# Patient Record
Sex: Female | Born: 1955 | Race: White | Hispanic: No | Marital: Married | Smoking: Current every day smoker
Health system: Southern US, Community
[De-identification: ages and names within clinical notes are randomized; demographics above are authoritative.]

## PROBLEM LIST (undated history)

## (undated) ENCOUNTER — Emergency Department (HOSPITAL_COMMUNITY): Admission: EM | Payer: Managed Care, Other (non HMO)

## (undated) DIAGNOSIS — G43909 Migraine, unspecified, not intractable, without status migrainosus: Secondary | ICD-10-CM

## (undated) DIAGNOSIS — Z87440 Personal history of urinary (tract) infections: Secondary | ICD-10-CM

## (undated) DIAGNOSIS — F329 Major depressive disorder, single episode, unspecified: Secondary | ICD-10-CM

## (undated) DIAGNOSIS — J449 Chronic obstructive pulmonary disease, unspecified: Secondary | ICD-10-CM

## (undated) DIAGNOSIS — G709 Myoneural disorder, unspecified: Secondary | ICD-10-CM

## (undated) DIAGNOSIS — I10 Essential (primary) hypertension: Secondary | ICD-10-CM

## (undated) DIAGNOSIS — M1990 Unspecified osteoarthritis, unspecified site: Secondary | ICD-10-CM

## (undated) DIAGNOSIS — F32A Depression, unspecified: Secondary | ICD-10-CM

## (undated) DIAGNOSIS — F419 Anxiety disorder, unspecified: Secondary | ICD-10-CM

## (undated) DIAGNOSIS — T1491XA Suicide attempt, initial encounter: Secondary | ICD-10-CM

## (undated) HISTORY — PX: BACK SURGERY: SHX140

## (undated) HISTORY — PX: FRACTURE SURGERY: SHX138

---

## 2001-02-25 ENCOUNTER — Emergency Department (HOSPITAL_COMMUNITY): Admission: EM | Admit: 2001-02-25 | Discharge: 2001-02-25 | Payer: Self-pay | Admitting: Emergency Medicine

## 2002-05-01 ENCOUNTER — Encounter: Payer: Self-pay | Admitting: Emergency Medicine

## 2002-05-01 ENCOUNTER — Inpatient Hospital Stay (HOSPITAL_COMMUNITY): Admission: EM | Admit: 2002-05-01 | Discharge: 2002-05-04 | Payer: Self-pay | Admitting: Emergency Medicine

## 2002-05-04 ENCOUNTER — Inpatient Hospital Stay (HOSPITAL_COMMUNITY): Admission: EM | Admit: 2002-05-04 | Discharge: 2002-05-07 | Payer: Self-pay | Admitting: Psychiatry

## 2002-05-08 ENCOUNTER — Other Ambulatory Visit (HOSPITAL_COMMUNITY): Admission: RE | Admit: 2002-05-08 | Discharge: 2002-05-12 | Payer: Self-pay | Admitting: Psychiatry

## 2006-09-30 ENCOUNTER — Emergency Department (HOSPITAL_COMMUNITY): Admission: EM | Admit: 2006-09-30 | Discharge: 2006-09-30 | Payer: Self-pay | Admitting: Emergency Medicine

## 2010-01-02 ENCOUNTER — Ambulatory Visit (HOSPITAL_BASED_OUTPATIENT_CLINIC_OR_DEPARTMENT_OTHER): Admission: RE | Admit: 2010-01-02 | Discharge: 2010-01-02 | Payer: Self-pay | Admitting: Orthopedic Surgery

## 2010-01-03 HISTORY — PX: ORIF CLAVICLE FRACTURE: SUR924

## 2010-09-10 LAB — POCT I-STAT 4, (NA,K, GLUC, HGB,HCT)
Potassium: 3.9 mEq/L (ref 3.5–5.1)
Sodium: 138 mEq/L (ref 135–145)

## 2010-10-05 ENCOUNTER — Ambulatory Visit (HOSPITAL_COMMUNITY)
Admission: RE | Admit: 2010-10-05 | Discharge: 2010-10-05 | Disposition: A | Discharge status: Home / Self Care | Payer: Commercial Managed Care - PPO | Source: Ambulatory Visit | Attending: Orthopedic Surgery | Admitting: Orthopedic Surgery

## 2010-10-05 ENCOUNTER — Other Ambulatory Visit (HOSPITAL_COMMUNITY): Payer: Self-pay | Admitting: Orthopedic Surgery

## 2010-10-05 DIAGNOSIS — T148XXA Other injury of unspecified body region, initial encounter: Secondary | ICD-10-CM

## 2010-10-05 DIAGNOSIS — K5730 Diverticulosis of large intestine without perforation or abscess without bleeding: Secondary | ICD-10-CM | POA: Insufficient documentation

## 2010-10-05 DIAGNOSIS — M169 Osteoarthritis of hip, unspecified: Secondary | ICD-10-CM | POA: Insufficient documentation

## 2010-10-05 DIAGNOSIS — M25559 Pain in unspecified hip: Secondary | ICD-10-CM | POA: Insufficient documentation

## 2010-10-05 DIAGNOSIS — M1610 Unilateral primary osteoarthritis, unspecified hip: Secondary | ICD-10-CM | POA: Insufficient documentation

## 2010-11-10 NOTE — H&P (Signed)
Brenda Rice, Brenda Rice                           ACCOUNT NO.:  [id number]   MEDICAL RECORD NO.:  [id number]                   PATIENT TYPE:  IPS   LOCATION:  0302                                 FACILITY:  [facility]   PHYSICIAN:  Geoffery Lyons, M.D.                   DATE OF BIRTH:  1956-05-03   DATE OF ADMISSION:  05/04/2002  DATE OF DISCHARGE:  05/07/2002                         PSYCHIATRIC ADMISSION ASSESSMENT   IDENTIFYING INFORMATION:  This is a 55 year old white female who is a  voluntary admission.   HISTORY OF PRESENT ILLNESS:  This patient with a history of alcohol abuse  since age 20 was admitted through the emergency room for an overdose of an  unknown number of Seroquel, along with an unknown number of Xanax, and she  had apparently been drinking alcohol.  She reports drinking approximately 6  beers per day.  She endorses depressed mood, irritable, and finds herself  being very self critical and states that she has been unable to sleep well  for the past year.  She stopped Zoloft and Seroquel about a year ago and  states she has not been able to sleep right since then and that her  depression has been becoming much more severe.  It is unclear on why she  stopped the Zoloft.  Her mother died in 11-Aug-2001 and there has been  chronic family conflict over her estate and the patient reports that she  tried to stop drinking for the past 8 days but could not sleep at all.  She  has had no clear period of sobriety within recent memory.  Today, the  patient endorses suicidal ideation without a clear plan.  She has had a 20  pound weight loss in the past month.   PAST PSYCHIATRIC HISTORY:  The patient was previously followed by Dr.  Barnett Abu but has not been there within the past year.  This is her  first admission to [facility].  She does have a  history of one prior hospitalization for depression in the distant past.   SOCIAL HISTORY:  The patient  is a married female with supportive husband and  family.  She works as a Manufacturing systems engineer.  She grew up the eldest  of 4 children.  She denies any legal charges.   FAMILY HISTORY:  Unremarkable.   ALCOHOL AND DRUG HISTORY:  The patient does remark that she uses  benzodiazapines and abuses alcohol.  She denies ever having any use of IV  drugs, denies any abuse of cocaine.   PAST MEDICAL HISTORY:  The patient has no regular primary care Ryon Layton.  She denies any current medical problems.  She does admit that her preventive  care has lapsed for the past several years.  Past medical history is  remarkable for a history of 1 miscarriage.  She has no history  of prior  hospitalizations or surgeries other than the hospitalization once prior for  depression.   REVIEW OF SYSTEMS:  Remarkable for no history of seizures, blackouts.  She  does endorse irritability and poor appetite and poor sleep.   MEDICATIONS:  The patient reports that she occasionally uses leftover Xanax  when she attempts to stop drinking.  This is a Xanax prescription from some  time ago in the distant past which she has had around the house.   DRUG ALLERGIES:  None.   POSITIVE PHYSICAL FINDINGS:  Please see the physical examination that is  noted in the record and was done on the medicine service prior to transfer.  Today, she is a well-nourished, well-developed female who is in no acute  distress.  Vital signs on admission to the unit show an elevated blood  pressure of 183/117.  Additional labs are currently pending.  Her metabolic  panel this morning noted platelets decreased at 147,000, and her potassium  on her metabolic panel was decreased at 3.1 on a scale of 3.5 - 5.5 normal.   MENTAL STATUS EXAM:  This is a slim-built female who is fully alert, with a  tearful affect.  She is cooperative, calm with good focus.  She is mildly  tremulous.  Speech is normal.  She is fairly articulate.  Mood is  depressed,  feeling guilty about her overdose and her substance abuse.  She feels quite  hopeless to overcome her family stressors and the conflict with her family  over her mother's estate.  Thought process is logical, no psychosis, no  evidence of paranoia.  She is suicidal with a plan to overdose, no evidence  of homicidal ideation.  Cognitively she is intact and oriented x3.   ADMISSION DIAGNOSIS:   AXIS I:  1. Major depressive disorder, recurrent, severe.  2. Alcohol abuse.   AXIS II:  Deferred.   AXIS III:  Hypokalemia and elevated blood pressure.   AXIS IV:  Moderate, family conflict.   AXIS V:  Current 20, past year 44.   INITIAL PLAN OF CARE:  Voluntarily admit the patient to detox her and we  will treat her symptoms.  Our goal is a safe detox and to alleviate her  suicidal ideation.  We have elected to place her on a Librium detox protocol  and will start her also on Zoloft 25 mg p.o. daily.  At this point, because  her blood pressure is elevated we will give her a dose of Clonidine to cover  this and after we have started a Librium detox protocol, we will see how her  blood pressure adjusts and if that comes down to normal.  She does appear to  be in full withdrawal at this time.  We have discussed this with the patient  and she is in full agreement with this plan.  Additionally, we are going to  give her K-Dur 20 mEq b.i.d. x 3 to correct her hypokalemia.      Margaret A. Scott, N.P.                   Geoffery Lyons, M.D.    MAS/MEDQ  D:  06/04/2002  T:  06/05/2002  Job:  [id number]

## 2010-11-10 NOTE — Discharge Summary (Signed)
Brenda Rice, Brenda Rice                           ACCOUNT NO.:  [id number]   MEDICAL RECORD NO.:  [id number]                   PATIENT TYPE:  IPS   LOCATION:  0302                                 FACILITY:  [facility]   PHYSICIAN:  Geoffery Lyons, M.D.                   DATE OF BIRTH:  08/06/1955   DATE OF ADMISSION:  05/04/2002  DATE OF DISCHARGE:  05/07/2002                                 DISCHARGE SUMMARY   CHIEF COMPLAINT AND PRESENT ILLNESS:  This was the first admission to [facility] for this 55 year old white female voluntarily  admitted.  She claimed I have just given up.  History of alcohol  dependence since age 55.  Admitted to the emergency room after overdosing on  Seroquel, alcohol and Xanax.  Drinking at least six beers daily.  Endorsed  depressed mood, irritable, self-critical, unable to sleep.  Has been on  Zoloft and Seroquel for a year.  Her mother died in 2001/08/22.  Her  family has conflict over the estate.  Endorsed suicidal ideation.  Tried to  stop drinking eight days with no sleep.   PAST PSYCHIATRIC HISTORY:  Previously followed by Dr. Milagros Evener.  One  prior admission for depression.   ALCOHOL/DRUG HISTORY:  Use of alcohol and benzodiazepines.   PAST MEDICAL HISTORY:  Denies the history of any major medical condition.   MEDICATIONS:  Xanax (when attempting to stop drinking).   PHYSICAL EXAMINATION:  Performed and failed to show any acute findings.   MENTAL STATUS EXAM:  Slim-built, fully alert, tearful female.  Cooperative,  calm.  Good focus.  Speech normal, goal directed.  She looks tremulous.  Mood depressed.  Affect depressed.  Thought processes logical, coherent and  relevant.  Content dealt with the events that led to be admitted.  No  evidence of psychosis.  No paranoia.  Wanting to die.  No act or plan.  No  homicidal ideation.  Cognition well-preserved.   ADMISSION DIAGNOSES:   AXIS I:  1. Major depression, recurrent.  2. Alcohol dependence.  3. Benzodiazepine abuse.   AXIS II:  No diagnosis.   AXIS III:  1. Hypokalemia.  2. High blood pressure.   AXIS IV:  Moderate.   AXIS V:  Global Assessment of Functioning upon admission 25; highest Global  Assessment of Functioning in the last 60-65.   LABORATORY DATA:  CBC was within normal limits.  Blood chemistries were  within normal limits.  Sodium 131, potassium 3.1.  SGOT and SGPT were within  normal limits.   HOSPITAL COURSE:  She was admitted and started intensive individual and  group psychotherapy.  She was detoxified using Librium and she was placed on  Zoloft 25 mg per day and Zoloft was increased to 50 mg per day.  She  continued to evidence increased blood pressure.  She required some clonidine  on a p.r.n. basis.  As the hospitalization progressed, she was able to  endorse the fact that she was dependent more and more on the alcohol and she  had indeed abused the benzodiazepines.  She did endorse recurrent  depression, even when she was not under the influence of alcohol.  On  May 07, 2002, she was fully detoxed, in full contact with reality.  Mood was improved.  Affect was bright and broad.  She denied any suicidal or  homicidal ideation.  We had a family session with the husband that went  well.  She was committed to sobriety, was willing to follow up with the  medications and abstain from alcohol and benzodiazepines.  As she was fully  detoxed and stable enough to be handled on an outpatient basis, we  discharged to outpatient follow-up.   DISCHARGE DIAGNOSES:   AXIS I:  1. Major depression, recurrent.  2. Alcohol dependence.  3. Benzodiazepine abuse.   AXIS II:  No diagnosis.   AXIS III:  1. Hypokalemia.  2. Hypertension.   AXIS IV:  Moderate.   AXIS V:  Global Assessment of Functioning upon discharge 55-60.   DISCHARGE MEDICATIONS:  1. Zoloft 50 mg per day.  2. Clonidine 0.1 mg twice a day.  3. Potassium 20 mEq  twice a day.   FOLLOW UP:  Continue follow-up with Valente David, CD IOP and her primary  physician.                                               Geoffery Lyons, M.D.    IL/MEDQ  D:  06/03/2002  T:  06/04/2002  Job:  [id number]

## 2010-11-10 NOTE — Discharge Summary (Signed)
Brenda Rice, Brenda Rice                           ACCOUNT NO.:  [id number]   MEDICAL RECORD NO.:  [id number]                   PATIENT TYPE:  INP   LOCATION:  3308                                 FACILITY:  [facility]   PHYSICIAN:  Catalina Pizza, M.D.                     DATE OF BIRTH:  [birth year]   DATE OF ADMISSION:  05/01/2002  DATE OF DISCHARGE:  05/04/2002                                 DISCHARGE SUMMARY   DISCHARGE DIAGNOSES:  1. Overdose on Seroquel.  2. Suicide attempt.  3. Alcohol abuse with withdrawal symptoms.  4. Respiratory depression.  5. Depression.  6. Hypertension.   DISCHARGE MEDICATIONS:  1. Lorazepam 1 mg q.4h. for ETOH withdrawal symptoms.  2. Protonix 40 mg p.o. q.d.  3. Thiamine 100 mg p.o. q.d.  4. Folic acid 1 mg p.o. q.d.   HISTORY OF PRESENT ILLNESS:  Brenda Rice is a 55 year old female with history  of depression and multiple suicide attempts, last being 2002, one in 1999,  and also in the mid 90s, ETOH and tobacco abuse who presented to the  emergency department after husband found her unresponsive on the cough. She  was approximately at 6 p.m. on 05/01/02. A note was found after the fact  which stated some suggestions for her brothers and sisters working out their  differences. At the time that the husband found Brenda Rice, he found  approximately 11 empty beer cans as well as an empty bottle Seroquel which  he estimated to have about 10 to 12 pills in it. He also stated that he felt  that she had taken some of his Xanax as well. He did report that she had  consistent breathing as well as good pulse and tried to wake her up with  taking her to the shower as well as forced emesis. Apparently, he has done  this before in similar situations. Her husband states that she had been  sneaking drinks daily but has not had any recent changes in behavioral  pattern. The patient was not seeing a psychiatrist currently and not taking  any antidepressants. She has previously  been on Zoloft and Seroquel in the  past and was previously followed by Dr. Lafayette Dragon, the psychiatrist, for her  problems.   ALLERGIES:  No known drug allergies.   MEDICATIONS:  On no current medicines prior to admission.   SUBSTANCE ABUSE HISTORY:  She is a current smoker, approximately two packs  per day for multiple years. Alcohol greater than a six pack per day. No  cocaine use. No IV drug use.   SOCIAL HISTORY:  She is married, and she is an Geophysicist/field seismologist at a Teacher, [employer]. She lives with her husband, and her son and grandchildren  live nearby. Currently in dispute with brothers and sisters about the state  of her deceased mother which is contributing to her  feelings of depression.   PHYSICAL EXAMINATION:  GENERAL:  Initially on exam, she was unresponsive but  on vent.  VITAL SIGNS:  Stable.  HEENT:  She had sluggish pupils, 1 to 2 mm.  LUNGS:  Respirations were clear. No wheezing or crackles.  CARDIOVASCULAR:  She is tachycardic but regular rhythm. No murmurs, rubs, or  gallops.  GI:  Positive bowel sounds. Soft. Nontender. Nondistended. No  hepatosplenomegaly.  EXTREMITIES:  No edema or cyanosis. Two plus pulses in all extremities.  NEUROLOGICAL:  Toes downgoing bilaterally. Withdraws from pain. Positive  corneal reflex.   LABORATORY DATA:  Initial lab work obtained showed ABG on FiO2 of 100% of  7.36, 35.2, and 491.0, O2. Her chest x-ray just showed cardiomegaly, no  acute disease. Sodium 135, potassium 3.3, chloride 106, bicarb 19, BUN 6,  creatinine 0.8, glucose 102. WBCs 10.3, hemoglobin 13.0, platelets 128, ANC  8.7, MCV 95.8. Anion gap was 10, bilirubin of 0.7, alkaline phosphatase 56,  SGOT 24, SGPT 16, protein 6.4, albumin 3.4, calcium 8.2. Acetaminophen less  than 10. Salicylates less than 4. Alcohol 181. UDS was negative. She was  positive for tricyclics.   HOSPITAL COURSE:  1. Seroquel overdose. The patient ingested an unknown amount of Seroquel but      was monitored consistently for common side effects which were CNS     depression, tachycardia, hypertension, prolongation of QTC and QRS. The     patient did receive serial EKGs which showed no progression of QRS or     QTC. She was on ventilator support as well as fluid support with no signs     of hypotension. The patient was weaned off of ventilation and extubated     approximately 12 hours after presentation to the emergency department.     Concerns were to continue to monitor due to Seroquel overdose may have     half life up to 20 hours. It also caused UDS to be positive for     tricyclics which is consistent with her taking the Seroquel. She did not     have an anion gap so doubt methanol, ethylene glycol, or isopropyl     alcohol. The patient's vital signs are stable at this time with some     tachycardia and mild hypertension, probably secondary to alcohol withdraw     type symptoms.  2. Alcohol abuse and withdraw. Per her husband, she was drinking     approximately greater than 6 beers a day and 11 beers prior to this     presentation to the emergency department. At this time, she is having     some tachycardia with slight elevation in blood pressure, probably     secondary to ETOH withdraw. She is currently on Ativan p.r.n. with     withdraw precautions in effect. Some of her tachycardia and anxiety may     be due to the fact that she also has two pack per day smoking history and     feels some anxiety about that as well.  3. Depression/suicidality. The patient has been evaluated by psychiatry who     stays for further evaluation at the behavioral health center. In talking     with patient, she states that she was not trying to harm herself.     Apparently, she had written it was not a suicide note, just writing down     suggestions and plans for sisters and brothers over splitting her  mother's estate. 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 Psych to further evaluate     this. Has had multiple suicide attempts previously and has multiple     family stresses. The patient denies any report of abuse at home and no     absences from work in the recent past. The patient is very evasive with     certain questions and has multiple nonconsistent type history.  4. Anemia. No signs of bleed. Probable decreased due to malnutrition and     ETOH use. Will need to be worked up as an outpatient on an outpatient     basis. No further workup needed at this time.  5. Decreased platelets. May be decreased due to ETOH. Stable at this time.     Could have been a delusional affect.    DISPOSITION:  The patient is medically stable at this point and currently  evaluated by psychiatry for further evaluation. ACT team to further assess  for transfer to [facility]. Encourage to seek further psychiatric  help. Have Behavioral Health continue monitoring for withdraw symptoms from  ETOH. Does have a slight tachycardia and blood pressure increase. Continue  with Ativan as needed to control this.                                               Catalina Pizza, M.D.    ZH/MEDQ  D:  05/04/2002  T:  05/04/2002  Job:  [id number]   cc:   Behavioral Health

## 2010-11-10 NOTE — H&P (Signed)
[facility]  Patient:    Brenda Rice, Brenda Rice Visit Number: [id number] MRN: [id number]          Service Type: EMS Location: ED Attending Physician:  Ilene Qua Dictated by:   Marinda Elk, M.D. Admit Date:  02/25/2001 Discharge Date: 02/25/2001                           History and Physical  HISTORY OF PRESENT ILLNESS:  This 55 year old married white female was brought to [facility] Emergency Room obtunded and intoxicated.  Dr. Christeen Douglas called me to admit the patient for multiple drug overdose.  Patient had been upset over losing her job last Friday, and today apparently got denied disability.  She became further upset and "just wanted to forget about it."  She denies any suicidal ideation.  She took three Valium, three Xanax and drank three 40-ounce bottles of beer.  She called her son on his cell phone, and the son reported to husband that he should go home and check things out.  When the husband got there, he found his wife on the floor with an upset ashtray nearby.  She was responsive but very confused and was unable to stand and walk on her own.  He therefore called 911 to bring her to the hospital.  Apparently, patient refused NG tube and refused to drink charcoal.  She was otherwise evaluated by Dr. Christeen Douglas.  She became disruptive at one point, pulling out her IV and therefore had to be restrained.  By the time of my arrival at 9:15, patient had calmed down significantly, was still inebriated but was cooperative and coherent.  She was able to relate the amounts and types of substances taken.  Husband was able to verify that none of his medications had been used, and that patient had not used any of the other medicines she had in her possession, at least by inspection of the amounts in bottles.  I did a pill count of the Xanax, where 58 were left of a prescription for 90 from August 23rd.  This would be perfectly in step with taking 3 a day over  the last 10 days, with an extra 3 tonight.  There were no Valium left of a bottle of 21 prescribed August 16th.  A bottle of Seroquel was brought in which she was no longer taking, as she has been switched onto Zoloft in the last several days.  PAST MEDICAL HISTORY:  Patient had one prior overdose attempt, about four years ago.  She was admitted to [facility] at that time, and underwent a three-day detox program with followup by Dr. Eliezer Bottom.  She has since been seen by Dr. Evelene Croon who has prescribed the above medicines.  CURRENT MEDICATIONS: 1. Zoloft 100 mg q.d. 2. Xanax 1 mg t.i.d. 3. Valium 10 mg p.r.n.  ALLERGIES:  None to medications.  SOCIAL HISTORY:  Recently let go from work.  Smokes cigarettes.  Normally drinks alcohol-free beverages, though has had excess alcohol use in the past.  REVIEW OF SYSTEMS:  Patient does not have headache, chest pain, abdominal pain or history of incontinence, though she was incontinent of urine twice in the ER prior to my arrival.  She has had some depression and clearly has anxiety problems at present.  PHYSICAL EXAMINATION:  VITAL SIGNS:  BP 131/78, respirations 16, temperature 97.0.  HEENT:  The eyes show significant lateral gaze nystagmus.  The pupils  are midposition and sluggish to react.  The oral mucosa is dry but free of lesions.  Gag reflex is intact.  NECK:  Supple.  There is no thyromegaly or JVD.  CHEST:  Clear anteriorly and at right base, with slight rhonchi heard on first breath, left base, cleared with deep breathing.  ABDOMEN:  Soft.  No HSM or tenderness.  GENITALIA:  Deferred.  EXTREMITIES:  The extremities allow fair range of motion, with some limit on exam due to four-point restraints.  Pulses in feet are normal.  SKIN:  Clear.  NEUROLOGIC:  By time of discharge, patient was oriented x 3, cooperative and coherent.  She did have some retained sluggish speech and some imbalance with walking, though she was  able to walk without assistance.  LABORATORY DATA:  Alcohol level 163.  Salicylates and acetaminophen undetectable.  BMET showed sodium of 131, BUN down at 5, calcium down at 8.1, glucose 87, remainder normal.  CBC normal.  ASSESSMENT: 1. Multiple substance abuse, with current overdose not triggered by any    suicidal ideation. 2. Situational adjustment reaction to losing job, which was the trigger for    this event. 3. Tobacco abuse. 4. Depression/anxiety, per Dr. Evelene Croon.  PLAN:  Discussed entire situation with patients nurse, nursing supervisor, husband and representative from [employer].  I have chosen to send patient home to the custody of her husband, to avoid any further sedating substances over the next 24 hours, and to call Dr. Evelene Croon tomorrow for followup. Husband has taken on responsibility of making that call, as well as staying with his wife all night tonight and all day tomorrow. Dictated by:   Marinda Elk, M.D. Attending Physician:  Ilene Qua DD:  02/26/01 TD:  02/26/01 Job: [id number]

## 2011-03-31 ENCOUNTER — Emergency Department (HOSPITAL_COMMUNITY): Payer: Managed Care, Other (non HMO)

## 2011-03-31 ENCOUNTER — Inpatient Hospital Stay (HOSPITAL_COMMUNITY)
Admission: EM | Admit: 2011-03-31 | Discharge: 2011-04-03 | DRG: 917 | Disposition: A | Discharge status: Other Acute Inpatient Hospital | Payer: Managed Care, Other (non HMO) | Attending: Pulmonary Disease | Admitting: Pulmonary Disease

## 2011-03-31 DIAGNOSIS — T510X4A Toxic effect of ethanol, undetermined, initial encounter: Secondary | ICD-10-CM | POA: Diagnosis present

## 2011-03-31 DIAGNOSIS — T43591A Poisoning by other antipsychotics and neuroleptics, accidental (unintentional), initial encounter: Secondary | ICD-10-CM | POA: Diagnosis present

## 2011-03-31 DIAGNOSIS — F1010 Alcohol abuse, uncomplicated: Secondary | ICD-10-CM | POA: Diagnosis present

## 2011-03-31 DIAGNOSIS — T65891A Toxic effect of other specified substances, accidental (unintentional), initial encounter: Secondary | ICD-10-CM | POA: Diagnosis present

## 2011-03-31 DIAGNOSIS — N390 Urinary tract infection, site not specified: Secondary | ICD-10-CM | POA: Diagnosis present

## 2011-03-31 DIAGNOSIS — E876 Hypokalemia: Secondary | ICD-10-CM | POA: Diagnosis present

## 2011-03-31 DIAGNOSIS — T424X4A Poisoning by benzodiazepines, undetermined, initial encounter: Principal | ICD-10-CM | POA: Diagnosis present

## 2011-03-31 DIAGNOSIS — F17200 Nicotine dependence, unspecified, uncomplicated: Secondary | ICD-10-CM | POA: Diagnosis present

## 2011-03-31 DIAGNOSIS — F411 Generalized anxiety disorder: Secondary | ICD-10-CM | POA: Diagnosis present

## 2011-03-31 DIAGNOSIS — R4182 Altered mental status, unspecified: Secondary | ICD-10-CM | POA: Diagnosis present

## 2011-03-31 DIAGNOSIS — G9349 Other encephalopathy: Secondary | ICD-10-CM | POA: Diagnosis present

## 2011-03-31 LAB — BLOOD GAS, ARTERIAL
Acid-base deficit: 2.3 mmol/L — ABNORMAL HIGH (ref 0.0–2.0)
Bicarbonate: 22 mEq/L (ref 20.0–24.0)
Drawn by: 336861
FIO2: 0.21 %
O2 Saturation: 95.1 %
Patient temperature: 98.6
TCO2: 19.7 mmol/L (ref 0–100)
pCO2 arterial: 38 mmHg (ref 35.0–45.0)
pH, Arterial: 7.38 (ref 7.350–7.400)
pO2, Arterial: 79.7 mmHg — ABNORMAL LOW (ref 80.0–100.0)

## 2011-03-31 LAB — DIFFERENTIAL
Basophils Absolute: 0 10*3/uL (ref 0.0–0.1)
Basophils Relative: 0 % (ref 0–1)
Eosinophils Absolute: 0.1 10*3/uL (ref 0.0–0.7)
Eosinophils Relative: 2 % (ref 0–5)
Lymphocytes Relative: 54 % — ABNORMAL HIGH (ref 12–46)
Lymphs Abs: 3.6 10*3/uL (ref 0.7–4.0)
Monocytes Absolute: 0.5 10*3/uL (ref 0.1–1.0)
Monocytes Relative: 7 % (ref 3–12)
Neutro Abs: 2.5 10*3/uL (ref 1.7–7.7)
Neutrophils Relative %: 37 % — ABNORMAL LOW (ref 43–77)

## 2011-03-31 LAB — URINE MICROSCOPIC-ADD ON

## 2011-03-31 LAB — CBC
HCT: 42.6 % (ref 36.0–46.0)
Hemoglobin: 14.5 g/dL (ref 12.0–15.0)
MCH: 32.7 pg (ref 26.0–34.0)
MCHC: 34 g/dL (ref 30.0–36.0)
MCV: 96.2 fL (ref 78.0–100.0)
Platelets: 179 10*3/uL (ref 150–400)
RBC: 4.43 MIL/uL (ref 3.87–5.11)
RDW: 12.9 % (ref 11.5–15.5)
WBC: 6.7 10*3/uL (ref 4.0–10.5)

## 2011-03-31 LAB — RAPID URINE DRUG SCREEN, HOSP PERFORMED
Amphetamines: NOT DETECTED
Barbiturates: NOT DETECTED
Benzodiazepines: POSITIVE — AB
Cocaine: NOT DETECTED
Opiates: NOT DETECTED
Tetrahydrocannabinol: NOT DETECTED

## 2011-03-31 LAB — URINALYSIS, ROUTINE W REFLEX MICROSCOPIC
Bilirubin Urine: NEGATIVE
Glucose, UA: NEGATIVE mg/dL
Hgb urine dipstick: NEGATIVE
Ketones, ur: NEGATIVE mg/dL
Leukocytes, UA: NEGATIVE
Nitrite: POSITIVE — AB
Protein, ur: NEGATIVE mg/dL
Specific Gravity, Urine: 1.007 (ref 1.005–1.030)
Urobilinogen, UA: 0.2 mg/dL (ref 0.0–1.0)
pH: 5.5 (ref 5.0–8.0)

## 2011-03-31 LAB — COMPREHENSIVE METABOLIC PANEL
ALT: 16 U/L (ref 0–35)
AST: 21 U/L (ref 0–37)
Albumin: 3.5 g/dL (ref 3.5–5.2)
Alkaline Phosphatase: 77 U/L (ref 39–117)
BUN: 8 mg/dL (ref 6–23)
CO2: 25 mEq/L (ref 19–32)
Calcium: 8.8 mg/dL (ref 8.4–10.5)
Chloride: 98 mEq/L (ref 96–112)
Creatinine, Ser: 0.71 mg/dL (ref 0.50–1.10)
GFR calc Af Amer: >90 mL/min (ref >90)
GFR calc non Af Amer: >90 mL/min (ref >90)
Glucose, Bld: 90 mg/dL (ref 70–99)
Potassium: 3.6 mEq/L (ref 3.5–5.1)
Sodium: 133 mEq/L — ABNORMAL LOW (ref 135–145)
Total Bilirubin: 0.1 mg/dL — ABNORMAL LOW (ref 0.3–1.2)
Total Protein: 7.1 g/dL (ref 6.0–8.3)

## 2011-03-31 LAB — ETHANOL: Alcohol, Ethyl (B): 258 mg/dL — ABNORMAL HIGH (ref 0–11)

## 2011-03-31 LAB — SALICYLATE LEVEL: Salicylate Lvl: <2 mg/dL — ABNORMAL LOW (ref 2.8–20.0)

## 2011-03-31 LAB — ACETAMINOPHEN LEVEL: Acetaminophen (Tylenol), Serum: <15 ug/mL (ref 10–30)

## 2011-03-31 LAB — POCT PREGNANCY, URINE: Preg Test, Ur: NEGATIVE

## 2011-04-01 ENCOUNTER — Inpatient Hospital Stay (HOSPITAL_COMMUNITY): Payer: Managed Care, Other (non HMO)

## 2011-04-01 DIAGNOSIS — R4182 Altered mental status, unspecified: Secondary | ICD-10-CM

## 2011-04-01 DIAGNOSIS — F1010 Alcohol abuse, uncomplicated: Secondary | ICD-10-CM

## 2011-04-01 DIAGNOSIS — T50902A Poisoning by unspecified drugs, medicaments and biological substances, intentional self-harm, initial encounter: Secondary | ICD-10-CM

## 2011-04-01 DIAGNOSIS — T50901A Poisoning by unspecified drugs, medicaments and biological substances, accidental (unintentional), initial encounter: Secondary | ICD-10-CM

## 2011-04-01 LAB — BASIC METABOLIC PANEL
BUN: 7 mg/dL (ref 6–23)
Calcium: 8.4 mg/dL (ref 8.4–10.5)
Chloride: 106 mEq/L (ref 96–112)
Creatinine, Ser: 0.55 mg/dL (ref 0.50–1.10)
GFR calc Af Amer: >90 mL/min (ref >90)

## 2011-04-01 LAB — CBC
HCT: 36 % (ref 36.0–46.0)
MCH: 32.9 pg (ref 26.0–34.0)
MCV: 95.5 fL (ref 78.0–100.0)
Platelets: 183 10*3/uL (ref 150–400)
RDW: 13.1 % (ref 11.5–15.5)
WBC: 7.9 10*3/uL (ref 4.0–10.5)

## 2011-04-02 DIAGNOSIS — R4182 Altered mental status, unspecified: Secondary | ICD-10-CM

## 2011-04-02 DIAGNOSIS — J9600 Acute respiratory failure, unspecified whether with hypoxia or hypercapnia: Secondary | ICD-10-CM

## 2011-04-02 DIAGNOSIS — F39 Unspecified mood [affective] disorder: Secondary | ICD-10-CM

## 2011-04-03 ENCOUNTER — Inpatient Hospital Stay (HOSPITAL_COMMUNITY)
Admission: AD | Admit: 2011-04-03 | Discharge: 2011-04-06 | DRG: 885 | Disposition: A | Discharge status: Home / Self Care | Payer: Managed Care, Other (non HMO) | Attending: Psychiatry | Admitting: Psychiatry

## 2011-04-03 DIAGNOSIS — F1010 Alcohol abuse, uncomplicated: Secondary | ICD-10-CM

## 2011-04-03 DIAGNOSIS — T510X4A Toxic effect of ethanol, undetermined, initial encounter: Secondary | ICD-10-CM

## 2011-04-03 DIAGNOSIS — I10 Essential (primary) hypertension: Secondary | ICD-10-CM

## 2011-04-03 DIAGNOSIS — T43502A Poisoning by unspecified antipsychotics and neuroleptics, intentional self-harm, initial encounter: Secondary | ICD-10-CM

## 2011-04-03 DIAGNOSIS — J9600 Acute respiratory failure, unspecified whether with hypoxia or hypercapnia: Secondary | ICD-10-CM

## 2011-04-03 DIAGNOSIS — R4182 Altered mental status, unspecified: Secondary | ICD-10-CM

## 2011-04-03 DIAGNOSIS — T6592XA Toxic effect of unspecified substance, intentional self-harm, initial encounter: Secondary | ICD-10-CM

## 2011-04-03 DIAGNOSIS — T424X4A Poisoning by benzodiazepines, undetermined, initial encounter: Secondary | ICD-10-CM

## 2011-04-03 DIAGNOSIS — N390 Urinary tract infection, site not specified: Secondary | ICD-10-CM

## 2011-04-03 DIAGNOSIS — F411 Generalized anxiety disorder: Secondary | ICD-10-CM

## 2011-04-03 DIAGNOSIS — F1310 Sedative, hypnotic or anxiolytic abuse, uncomplicated: Secondary | ICD-10-CM

## 2011-04-03 DIAGNOSIS — F339 Major depressive disorder, recurrent, unspecified: Principal | ICD-10-CM

## 2011-04-03 LAB — BASIC METABOLIC PANEL
BUN: 8 mg/dL (ref 6–23)
Creatinine, Ser: 0.53 mg/dL (ref 0.50–1.10)
GFR calc Af Amer: >90 mL/min (ref >90)
GFR calc non Af Amer: >90 mL/min (ref >90)

## 2011-04-03 LAB — CBC
HCT: 39.9 % (ref 36.0–46.0)
MCHC: 34.1 g/dL (ref 30.0–36.0)
MCV: 95.2 fL (ref 78.0–100.0)
Platelets: 167 10*3/uL (ref 150–400)
RDW: 12.7 % (ref 11.5–15.5)
WBC: 6.7 10*3/uL (ref 4.0–10.5)

## 2011-04-04 DIAGNOSIS — F1010 Alcohol abuse, uncomplicated: Secondary | ICD-10-CM

## 2011-04-04 DIAGNOSIS — F1520 Other stimulant dependence, uncomplicated: Secondary | ICD-10-CM
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
## 2011-04-19 NOTE — Discharge Summary (Signed)
  [name], [name] NO.:  [id number]  MEDICAL RECORD NO.:  [id number]  LOCATION:  1511                         FACILITY:  [facility]  PHYSICIAN:  Devra Dopp, MSN, ACNP DATE OF BIRTH:  01/20/1956  DATE OF ADMISSION:  03/31/2011 DATE OF DISCHARGE:                              DISCHARGE SUMMARY   DISCHARGE DIAGNOSES: 1. Altered mental status secondary to benzodiazepine and ETOH with     overdose. 2. Urinary tract infection. 3. Hypokalemia.  HISTORY OF PRESENT ILLNESS:  [name] is a 55 year old white female who presented with altered mental status on April 01, 2011.  She has had no significant medical history, but known ETOH and benzodiazepine abuse on previous admissions for overdose and she was brought to [facility] Emergency Department for altered mental status. Neuromonitor was placed.  Antibiotics consisted of ceftriaxone and Cipro.  LABORATORY DATA:  Hemoglobin 13.6, hematocrit 39.9, WBC is 6.7, and platelets 167.  Sodium 138, potassium 3.4, chloride 103, CO2 is 24, BUN is 8, creatinine 4.3, and glucose is 96.  HOSPITAL COURSE BY DISCHARGE DIAGNOSES: 1. Altered mental status secondary to benzodiazepine and alcohol     overdose.  She was evaluated by Psychiatry, and due to the severity     of her overdose to be transferred to [facility] for     further treatment.  She reached maximal hospital benefit on April 03, 2011, and was ready for transfer to [facility]. 2. Urinary tract infection.  She will be placed on 3 days of     ciprofloxacin. 3. Hypokalemia, which has been repleted.  DISCHARGE MEDICATIONS:  Her discharge medications may be altered when she reaches the [facility]; ciprofloxacin 500 mg 1 b.i.d. for the next 72 hours, folic acid 1 mg daily, thiamine 100 mg daily, citalopram 20 mg daily, lisinopril 40 mg daily, Norvasc 10 mg daily, multivitamins 1 daily, and Xanax 1 mg by mouth 3 times a  day.  Her diet is a heart-healthy diet.  Her disposition/condition at the time of transfer to [facility] has improved.  This is her long discharge summary, requiring greater than 60 minutes.     Devra Dopp, MSN, ACNP     SM/MEDQ  D:  04/03/2011  T:  04/03/2011  Job:  [id number]  Electronically Signed by Devra Dopp MSN ACNP on 04/18/2011 05:44:31 AM Electronically Signed by Shan Levans MD FCCP on 04/19/2011 04:51:52 PM
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
## 2011-07-05 ENCOUNTER — Ambulatory Visit (INDEPENDENT_AMBULATORY_CARE_PROVIDER_SITE_OTHER): Payer: Managed Care, Other (non HMO)

## 2011-07-05 DIAGNOSIS — F430 Acute stress reaction: Secondary | ICD-10-CM

## 2011-07-05 DIAGNOSIS — G43009 Migraine without aura, not intractable, without status migrainosus: Secondary | ICD-10-CM

## 2011-07-05 DIAGNOSIS — Z79899 Other long term (current) drug therapy: Secondary | ICD-10-CM

## 2011-07-06 ENCOUNTER — Other Ambulatory Visit: Payer: Self-pay | Admitting: Internal Medicine

## 2011-07-09 ENCOUNTER — Other Ambulatory Visit: Payer: Self-pay

## 2011-07-09 ENCOUNTER — Emergency Department (HOSPITAL_COMMUNITY): Payer: Managed Care, Other (non HMO)

## 2011-07-09 ENCOUNTER — Inpatient Hospital Stay (HOSPITAL_COMMUNITY)
Admission: EM | Admit: 2011-07-09 | Discharge: 2011-07-11 | DRG: 917 | Disposition: A | Discharge status: Other Acute Inpatient Hospital | Payer: Managed Care, Other (non HMO) | Attending: Internal Medicine | Admitting: Internal Medicine

## 2011-07-09 ENCOUNTER — Encounter (HOSPITAL_COMMUNITY): Payer: Self-pay | Admitting: *Deleted

## 2011-07-09 DIAGNOSIS — D696 Thrombocytopenia, unspecified: Secondary | ICD-10-CM | POA: Diagnosis present

## 2011-07-09 DIAGNOSIS — F17200 Nicotine dependence, unspecified, uncomplicated: Secondary | ICD-10-CM | POA: Diagnosis present

## 2011-07-09 DIAGNOSIS — T43502A Poisoning by unspecified antipsychotics and neuroleptics, intentional self-harm, initial encounter: Secondary | ICD-10-CM | POA: Diagnosis present

## 2011-07-09 DIAGNOSIS — T424X4A Poisoning by benzodiazepines, undetermined, initial encounter: Secondary | ICD-10-CM

## 2011-07-09 DIAGNOSIS — J9600 Acute respiratory failure, unspecified whether with hypoxia or hypercapnia: Secondary | ICD-10-CM | POA: Diagnosis present

## 2011-07-09 DIAGNOSIS — T50901A Poisoning by unspecified drugs, medicaments and biological substances, accidental (unintentional), initial encounter: Secondary | ICD-10-CM

## 2011-07-09 DIAGNOSIS — T438X2A Poisoning by other psychotropic drugs, intentional self-harm, initial encounter: Secondary | ICD-10-CM | POA: Diagnosis present

## 2011-07-09 DIAGNOSIS — Y929 Unspecified place or not applicable: Secondary | ICD-10-CM

## 2011-07-09 DIAGNOSIS — F10929 Alcohol use, unspecified with intoxication, unspecified: Secondary | ICD-10-CM

## 2011-07-09 DIAGNOSIS — T424X1A Poisoning by benzodiazepines, accidental (unintentional), initial encounter: Secondary | ICD-10-CM | POA: Diagnosis present

## 2011-07-09 DIAGNOSIS — M1990 Unspecified osteoarthritis, unspecified site: Secondary | ICD-10-CM | POA: Diagnosis present

## 2011-07-09 DIAGNOSIS — I10 Essential (primary) hypertension: Secondary | ICD-10-CM | POA: Diagnosis present

## 2011-07-09 DIAGNOSIS — J9690 Respiratory failure, unspecified, unspecified whether with hypoxia or hypercapnia: Secondary | ICD-10-CM | POA: Diagnosis present

## 2011-07-09 DIAGNOSIS — E871 Hypo-osmolality and hyponatremia: Secondary | ICD-10-CM | POA: Diagnosis present

## 2011-07-09 DIAGNOSIS — R4182 Altered mental status, unspecified: Secondary | ICD-10-CM

## 2011-07-09 HISTORY — DX: Essential (primary) hypertension: I10

## 2011-07-09 HISTORY — DX: Unspecified osteoarthritis, unspecified site: M19.90

## 2011-07-09 HISTORY — DX: Depression, unspecified: F32.A

## 2011-07-09 HISTORY — DX: Personal history of urinary (tract) infections: Z87.440

## 2011-07-09 HISTORY — DX: Major depressive disorder, single episode, unspecified: F32.9

## 2011-07-09 HISTORY — DX: Suicide attempt, initial encounter: T14.91XA

## 2011-07-09 LAB — COMPREHENSIVE METABOLIC PANEL
ALT: 73 U/L — ABNORMAL HIGH (ref 0–35)
Calcium: 9.1 mg/dL (ref 8.4–10.5)
GFR calc Af Amer: >90 mL/min (ref >90)
Glucose, Bld: 98 mg/dL (ref 70–99)
Sodium: 126 mEq/L — ABNORMAL LOW (ref 135–145)
Total Protein: 7.6 g/dL (ref 6.0–8.3)

## 2011-07-09 LAB — URINALYSIS, ROUTINE W REFLEX MICROSCOPIC
Bilirubin Urine: NEGATIVE
Leukocytes, UA: NEGATIVE
Nitrite: NEGATIVE
Specific Gravity, Urine: 1.007 (ref 1.005–1.030)
pH: 6 (ref 5.0–8.0)

## 2011-07-09 LAB — BLOOD GAS, ARTERIAL
Acid-base deficit: 1 mmol/L (ref 0.0–2.0)
Bicarbonate: 23.4 mEq/L (ref 20.0–24.0)
FIO2: 1 %
TCO2: 21.2 mmol/L (ref 0–100)
pCO2 arterial: 40.2 mmHg (ref 35.0–45.0)
pO2, Arterial: 455 mmHg — ABNORMAL HIGH (ref 80.0–100.0)

## 2011-07-09 LAB — MRSA PCR SCREENING: MRSA by PCR: NEGATIVE

## 2011-07-09 LAB — CBC
MCH: 33.3 pg (ref 26.0–34.0)
MCHC: 35.9 g/dL (ref 30.0–36.0)
MCV: 92.8 fL (ref 78.0–100.0)
Platelets: 153 10*3/uL (ref 150–400)
RDW: 13.3 % (ref 11.5–15.5)

## 2011-07-09 LAB — URINE CULTURE: Culture  Setup Time: 201301150158

## 2011-07-09 LAB — DIFFERENTIAL
Basophils Absolute: 0 10*3/uL (ref 0.0–0.1)
Eosinophils Absolute: 0.1 10*3/uL (ref 0.0–0.7)
Eosinophils Relative: 1 % (ref 0–5)
Lymphs Abs: 2.9 10*3/uL (ref 0.7–4.0)

## 2011-07-09 LAB — PROCALCITONIN: Procalcitonin: <0.1 ng/mL

## 2011-07-09 LAB — LACTIC ACID, PLASMA: Lactic Acid, Venous: 2.4 mmol/L — ABNORMAL HIGH (ref 0.5–2.2)

## 2011-07-09 MED ORDER — MIDAZOLAM HCL 2 MG/2ML IJ SOLN
1.0000 mg | INTRAMUSCULAR | Status: DC | PRN
  Administered 2011-07-09: 2 mg via INTRAVENOUS
  Filled 2011-07-09: qty 2

## 2011-07-09 MED ORDER — INFLUENZA VIRUS VACC SPLIT PF IM SUSP
0.5000 mL | INTRAMUSCULAR | Status: DC
  Filled 2011-07-09: qty 0.5

## 2011-07-09 MED ORDER — SODIUM CHLORIDE 0.9 % IV SOLN
Freq: Once | INTRAVENOUS | Status: AC
  Administered 2011-07-09: 1000 mL via INTRAVENOUS

## 2011-07-09 MED ORDER — ROCURONIUM BROMIDE 50 MG/5ML IV SOLN
INTRAVENOUS | Status: AC
  Administered 2011-07-09: 50 mg via INTRAVENOUS
  Filled 2011-07-09: qty 2

## 2011-07-09 MED ORDER — HEPARIN SODIUM (PORCINE) 5000 UNIT/ML IJ SOLN
5000.0000 [IU] | Freq: Three times a day (TID) | INTRAMUSCULAR | Status: DC
  Administered 2011-07-09 – 2011-07-11 (×5): 5000 [IU] via SUBCUTANEOUS
  Filled 2011-07-09 (×11): qty 1

## 2011-07-09 MED ORDER — SODIUM CHLORIDE 0.9 % IV SOLN: 250.0000 mL | INTRAVENOUS | Status: DC | PRN

## 2011-07-09 MED ORDER — LIDOCAINE HCL (CARDIAC) 20 MG/ML IV SOLN
INTRAVENOUS | Status: AC
  Filled 2011-07-09: qty 5

## 2011-07-09 MED ORDER — SODIUM CHLORIDE 0.9 % IV SOLN
INTRAVENOUS | Status: DC
  Administered 2011-07-09 – 2011-07-11 (×4): via INTRAVENOUS

## 2011-07-09 MED ORDER — FENTANYL CITRATE 0.05 MG/ML IJ SOLN
50.0000 ug | Freq: Once | INTRAMUSCULAR | Status: AC
  Administered 2011-07-09: 50 ug via INTRAVENOUS
  Filled 2011-07-09: qty 2

## 2011-07-09 MED ORDER — SUCCINYLCHOLINE CHLORIDE 20 MG/ML IJ SOLN
INTRAMUSCULAR | Status: AC
  Filled 2011-07-09: qty 5

## 2011-07-09 MED ORDER — SUCCINYLCHOLINE CHLORIDE 20 MG/ML IJ SOLN
INTRAMUSCULAR | Status: AC | PRN
  Administered 2011-07-09: 100 mg via INTRAVENOUS

## 2011-07-09 MED ORDER — PROPOFOL 10 MG/ML IV EMUL
5.0000 ug/kg/min | INTRAVENOUS | Status: DC
  Administered 2011-07-09: 30 ug/kg/min via INTRAVENOUS
  Administered 2011-07-09: 20 ug/kg/min via INTRAVENOUS
  Administered 2011-07-10: 30 ug/kg/min via INTRAVENOUS
  Administered 2011-07-10: 40 ug/kg/min via INTRAVENOUS
  Filled 2011-07-09: qty 50

## 2011-07-09 MED ORDER — ETOMIDATE 2 MG/ML IV SOLN
INTRAVENOUS | Status: AC | PRN
  Administered 2011-07-09: 20 mg via INTRAVENOUS

## 2011-07-09 MED ORDER — PROPOFOL 10 MG/ML IV EMUL
INTRAVENOUS | Status: AC
  Filled 2011-07-09: qty 50

## 2011-07-09 MED ORDER — BIOTENE DRY MOUTH MT LIQD
15.0000 mL | Freq: Four times a day (QID) | OROMUCOSAL | Status: DC
  Administered 2011-07-10 (×3): 15 mL via OROMUCOSAL

## 2011-07-09 MED ORDER — FENTANYL CITRATE 0.05 MG/ML IJ SOLN
25.0000 ug | INTRAMUSCULAR | Status: DC | PRN
  Administered 2011-07-09: 50 ug via INTRAVENOUS
  Filled 2011-07-09: qty 2

## 2011-07-09 MED ORDER — PANTOPRAZOLE SODIUM 40 MG IV SOLR
40.0000 mg | Freq: Every day | INTRAVENOUS | Status: DC
  Administered 2011-07-09: 40 mg via INTRAVENOUS
  Filled 2011-07-09 (×2): qty 40

## 2011-07-09 MED ORDER — CHLORHEXIDINE GLUCONATE 0.12 % MT SOLN
15.0000 mL | Freq: Two times a day (BID) | OROMUCOSAL | Status: DC
  Administered 2011-07-10 (×2): 15 mL via OROMUCOSAL
  Filled 2011-07-09 (×3): qty 15

## 2011-07-09 MED ORDER — MIDAZOLAM HCL 2 MG/2ML IJ SOLN
2.0000 mg | Freq: Once | INTRAMUSCULAR | Status: AC
  Administered 2011-07-09: 2 mg via INTRAVENOUS
  Filled 2011-07-09: qty 2

## 2011-07-09 MED ORDER — ETOMIDATE 2 MG/ML IV SOLN
INTRAVENOUS | Status: AC
  Filled 2011-07-09: qty 20

## 2011-07-09 MED ORDER — SODIUM CHLORIDE 0.9 % IV SOLN
INTRAVENOUS | Status: AC | PRN
  Administered 2011-07-09: 1000 mL via INTRAVENOUS

## 2011-07-09 MED ORDER — ROCURONIUM BROMIDE 50 MG/5ML IV SOLN
0.6000 mg/kg | Freq: Once | INTRAVENOUS | Status: DC
  Administered 2011-07-09: 50 mg via INTRAVENOUS

## 2011-07-09 NOTE — ED Notes (Signed)
Pt coughing and biting at tube at this time. Left hand raised near mouth. Cindee Lame, PA notified. Medicate per prn sedation orders.

## 2011-07-09 NOTE — ED Notes (Signed)
PT brought in by EMS, no gag reflex noted. Responsive to some painful stimuli. MD made aware. MD Preston Fleeting and RN x3 at bedside. Preparing for intubation.

## 2011-07-09 NOTE — ED Notes (Signed)
ZOX:WRUE<AV> Expected date:07/09/11<BR> Expected time: 2:38 PM<BR> Means of arrival:Ambulance<BR> Comments:<BR> OD..M40. 56 yo f. OD on xanax (unknown ammount or ingestion). Pt has ALOC and depressed resp. 10-15 min

## 2011-07-09 NOTE — ED Notes (Signed)
Per ems: pt had intentional overdose of unk amt of 1 mg xanax at 1345 today. Witnessed by husband, took 3 handfuls. Has access to 270 pills. Pt had gag reflex with ems, this has since disappeared. LOC has decreased, responsive to painful stimuli only. ETOH on board as well. Pupils 8mm and sluggish.

## 2011-07-09 NOTE — ED Notes (Signed)
ET tube placement successful x1. 7.5" ET tube. 23cm at the lip. +CO2 change, positive lung sounds. Awaiting chest xray verification. 16Fr OG placed. RT at bedside.

## 2011-07-09 NOTE — ED Notes (Signed)
Have attempted to contact [facility] multiple times. Continually receiving message "all circuits are busy" or busy signal. Will continue attempting to reach center.

## 2011-07-09 NOTE — ED Notes (Signed)
Cindee Lame, PCCM NP at bedside.

## 2011-07-09 NOTE — ED Provider Notes (Signed)
History     CSN: [id number]  Arrival date & time 07/09/11  1449   First MD Initiated Contact with Patient 07/09/11 1502      Chief Complaint  Patient presents with  . Drug Overdose    (Consider location/radiation/quality/duration/timing/severity/associated sxs/prior treatment) Patient is a 56 y.o. female presenting with Overdose. The history is provided by the EMS personnel. The history is limited by the condition of the patient (Altered mental status).  Drug Overdose  She is brought by EMS after apparently taking about 3 handsful of Xanax 1 mg tablets and that was allegedly with suicidal intent. She initially had a gag reflex but lost that just before arrival in the emergency department. Patient is lethargic and able to give any verbal responses.  Past Medical History  Diagnosis Date  . Hypertension     History reviewed. No pertinent past surgical history.  No family history on file.  History  Substance Use Topics  . Smoking status: Not on file  . Smokeless tobacco: Not on file  . Alcohol Use:     OB History    Grav Para Term Preterm Abortions TAB SAB Ect Mult Living                  Review of Systems  Unable to perform ROS: Mental status change    Allergies  Review of patient's allergies indicates not on file.  Home Medications  No current outpatient prescriptions on file.  BP 150/97  Temp(Src) 97.9 F (36.6 C) (Rectal)  Resp 15  SpO2 100%  Physical Exam  Nursing note and vitals reviewed.  56 year old female lethargic and with gurgling in her secretions in her pharynx. Vital signs are noted in for mild tachycardia heart rate of 112 and mild tachypnea with respiratory rate of 21. Oxygen saturation is 100% which is normal, but this is with supplemental oxygen. Head is normocephalic and atraumatic. PERRLA, EOMI. Oropharynx has pulled secretions in the posterior pharynx and she does not have any gag reflex present. Is supple. Lungs are clear without rales,  wheezes, or rhonchi. Heart has regular rate and rhythm tachycardic without murmur. Abdomen is soft and nontender without masses or hepatosplenomegaly. Use and to show old ecchymosis in the right thigh but no acute trauma. There is no cyanosis or edema. Full range of motion is present. Him and moist without rash. Neurologic mental status is as noted above. She does react to painful noxious stimuli with purposeful movement but does not follow commands. There no focal motor or sensory deficits.   ED Course have evidence of  INTUBATION Date/Time: 07/09/2011 3:00 PM Performed by: Dione Booze Authorized by: Preston Fleeting, Tekila Caillouet Consent: Verbal consent not obtained. Written consent not obtained. The procedure was performed in an emergent situation. Required items: required blood products, implants, devices, and special equipment available Patient identity confirmed: arm band and hospital-assigned identification number Time out: Immediately prior to procedure a "time out" was called to verify the correct patient, procedure, equipment, support staff and site/side marked as required. Indications: airway protection Intubation method: direct Patient status: paralyzed (RSI) Preoxygenation: nonrebreather mask and BVM Pretreatment medications: none Sedatives: etomidate Paralytic: succinylcholine Laryngoscope size: Miller 2 Tube size: 7.5 mm Tube type: cuffed Number of attempts: 1 Cricoid pressure: yes Cords visualized: yes Post-procedure assessment: chest rise and CO2 detector Breath sounds: equal Cuff inflated: yes ETT to lip: 23 cm Tube secured with: ETT holder Chest x-ray interpreted by radiologist. Chest x-ray findings: endotracheal tube in appropriate position Patient tolerance: Patient  tolerated the procedure well with no immediate complications. Comments: She had a moderate amount of pooled secretions in her oropharynx which required suctioning. There was one attempt at an awake intubation which the  patient did not tolerate. Laying intubation, an orogastric tube was placed.   (including critical care time)  Results for orders placed during the hospital encounter of 07/09/11  CBC      Component Value Range   WBC 6.9  4.0 - 10.5 (K/uL)   RBC 4.17  3.87 - 5.11 (MIL/uL)   Hemoglobin 13.9  12.0 - 15.0 (g/dL)   HCT 09.6  04.5 - 40.9 (%)   MCV 92.8  78.0 - 100.0 (fL)   MCH 33.3  26.0 - 34.0 (pg)   MCHC 35.9  30.0 - 36.0 (g/dL)   RDW 81.1  91.4 - 78.2 (%)   Platelets 153  150 - 400 (K/uL)  DIFFERENTIAL      Component Value Range   Neutrophils Relative 48  43 - 77 (%)   Neutro Abs 3.3  1.7 - 7.7 (K/uL)   Lymphocytes Relative 41  12 - 46 (%)   Lymphs Abs 2.9  0.7 - 4.0 (K/uL)   Monocytes Relative 10  3 - 12 (%)   Monocytes Absolute 0.7  0.1 - 1.0 (K/uL)   Eosinophils Relative 1  0 - 5 (%)   Eosinophils Absolute 0.1  0.0 - 0.7 (K/uL)   Basophils Relative 0  0 - 1 (%)   Basophils Absolute 0.0  0.0 - 0.1 (K/uL)  COMPREHENSIVE METABOLIC PANEL      Component Value Range   Sodium 126 (*) 135 - 145 (mEq/L)   Potassium 4.1  3.5 - 5.1 (mEq/L)   Chloride 88 (*) 96 - 112 (mEq/L)   CO2 22  19 - 32 (mEq/L)   Glucose, Bld 98  70 - 99 (mg/dL)   BUN 3 (*) 6 - 23 (mg/dL)   Creatinine, Ser 9.56 (*) 0.50 - 1.10 (mg/dL)   Calcium 9.1  8.4 - 21.3 (mg/dL)   Total Protein 7.6  6.0 - 8.3 (g/dL)   Albumin 3.7  3.5 - 5.2 (g/dL)   AST 76 (*) 0 - 37 (U/L)   ALT 73 (*) 0 - 35 (U/L)   Alkaline Phosphatase 80  39 - 117 (U/L)   Total Bilirubin 0.2 (*) 0.3 - 1.2 (mg/dL)   GFR calc non Af Amer >90  >90 (mL/min)   GFR calc Af Amer >90  >90 (mL/min)  URINALYSIS, ROUTINE W REFLEX MICROSCOPIC      Component Value Range   Color, Urine YELLOW  YELLOW    APPearance CLEAR  CLEAR    Specific Gravity, Urine 1.007  1.005 - 1.030    pH 6.0  5.0 - 8.0    Glucose, UA NEGATIVE  NEGATIVE (mg/dL)   Hgb urine dipstick NEGATIVE  NEGATIVE    Bilirubin Urine NEGATIVE  NEGATIVE    Ketones, ur NEGATIVE  NEGATIVE (mg/dL)    Protein, ur NEGATIVE  NEGATIVE (mg/dL)   Urobilinogen, UA 0.2  0.0 - 1.0 (mg/dL)   Nitrite NEGATIVE  NEGATIVE    Leukocytes, UA NEGATIVE  NEGATIVE   ETHANOL      Component Value Range   Alcohol, Ethyl (B) 258 (*) 0 - 11 (mg/dL)  SALICYLATE LEVEL      Component Value Range   Salicylate Lvl <2.0 (*) 2.8 - 20.0 (mg/dL)  ACETAMINOPHEN LEVEL      Component Value Range   Acetaminophen (Tylenol),  Serum <15.0  10 - 30 (ug/mL)  BLOOD GAS, ARTERIAL      Component Value Range   FIO2 1.00     Delivery systems VENTILATOR     Mode PRESSURE REGULATED VOLUME CONTROL     VT 500     Rate 14     Peep/cpap 5.0     pH, Arterial 7.384  7.350 - 7.400    pCO2 arterial 40.2  35.0 - 45.0 (mmHg)   pO2, Arterial 455.0 (*) 80.0 - 100.0 (mmHg)   Bicarbonate 23.4  20.0 - 24.0 (mEq/L)   TCO2 21.2  0 - 100 (mmol/L)   Acid-base deficit 1.0  0.0 - 2.0 (mmol/L)   O2 Saturation 99.7     Patient temperature 98.6     Collection site RIGHT RADIAL     Drawn by [id number]     Sample type ARTERIAL DRAW     Allens test (pass/fail) PASS  PASS    Dg Chest Portable 1 View  07/09/2011  *RADIOLOGY REPORT*  Clinical Data: Intubation.  PORTABLE CHEST - 1 VIEW  Comparison: 04/01/2011.  Findings: The endotracheal tube is 3.4 cm above the carina.  The NG tube is in the stomach.  The cardiac silhouette, mediastinal and hilar contours are within normal limits.  The lungs are clear except for minimal streaky left basilar atelectasis.  IMPRESSION: The endotracheal tube is 3.4 cm above the carina. Minimal basilar atelectasis.  Original Report Authenticated By: P. Loralie Champagne, M.D.      1. Drug overdose   2. Alcohol intoxication      Date: 07/09/2011  Rate: 116  Rhythm: sinus tachycardia  QRS Axis: normal  Intervals: normal  ST/T Wave abnormalities: Slight ST elevation in inferior and anterolateral leads  Conduction Disutrbances:none  Narrative Interpretation: Sinus tachycardia with anterior Q waves consistent with an old  myocardial infarction. ST elevation in a pattern that is more suggestive of early repolarization than STEMI.  Old EKG Reviewed: none available  After intubation, she was given a paralytic. Critical care medicine has been consulted and I have spoken with Dr. Delford Field she will need to be admitted to the ICU until her respiratory status is improved to the point where she can be safely extubated.  CRITICAL CARE Performed by: Dione Booze   Total critical care time: 90 minutes  Critical care time was exclusive of separately billable procedures and treating other patients.  Critical care was necessary to treat or prevent imminent or life-threatening deterioration.  Critical care was time spent personally by me on the following activities: development of treatment plan with patient and/or surrogate as well as nursing, discussions with consultants, evaluation of patient's response to treatment, examination of patient, obtaining history from patient or surrogate, ordering and performing treatments and interventions, ordering and review of laboratory studies, ordering and review of radiographic studies, pulse oximetry and re-evaluation of patient's condition.   MDM   today is again overdose with apparently was with suicidal intent. With loss of gag reflex, she needs to be intubated to protect her airway.        Dione Booze, MD 07/09/11 [phone]

## 2011-07-09 NOTE — H&P (Addendum)
Name: Brenda Rice MRN: [id number] DOB: Mar 29, 1956    LOS: 0  [facility]  History of Present Illness: 56 year old female presents to the ED via EMS for intentional overdose. Patient took 3 handfuls of 1 mg Xanax, that was witnessed by husband. Upon arrival to ED patient had decreased LOC and was subsequently intubated for airway protection.      Lines / Drains: ETT 1/14>>>  Cultures:   Antibiotics:   Tests / Events: Intubated 1/14  The patient is sedated, intubated and unable to provide history, which was obtained for available medical records.    Past Medical History  Diagnosis Date  . Hypertension    History reviewed. No pertinent past surgical history. Prior to Admission medications   Not on File   Allergies Allergies not on file  Family History No family history on file.  Social History  does not have a smoking history on file. She does not have any smokeless tobacco history on file. Her alcohol and drug histories not on file.  Review Of Systems : Unable to obtain due to patients condition and no family available  Vital Signs: Temp:  [97.6 F (36.4 C)-97.9 F (36.6 C)] 97.6 F (36.4 C) (01/14 1530) Pulse Rate:  [102] 102  (01/14 1530) Resp:  [14-27] 14  (01/14 1530) BP: (126-164)/(87-103) 141/93 mmHg (01/14 1530) SpO2:  [100 %] 100 % (01/14 1530) FiO2 (%):  [100 %] 100 % (01/14 1510)       Physical Examination: General: 56 yo female intubated in no acute distress Neuro: GCS 3T, does not arouse to painful stimuli  HEENT: orally intubated Cardiovascular: RRR, no murmurs Lungs: Clear Abdomen: active BS, soft Musculoskeletal: no edema Skin: intact  Ventilator settings: Vent Mode:  [-] PRVC FiO2 (%):  [100 %] 100 % Set Rate:  [14 bmp] 14 bmp Vt Set:  [500 mL] 500 mL PEEP:  [5 cmH20] 5 cmH20 Plateau Pressure:  [19 cmH20] 19 cmH20  Labs and Imaging:  No results found for this basename:  NA:3,K:3,CL:3,CO2:3,BUN:3,CREATININE:3,GLUCOSE:3 in the last 168 hours  Lab 07/09/11 1500  HGB 13.9  HCT 38.7  WBC 6.9  PLT 153    Assessment and Plan:  Respiratory failure secondary to benzo overdose. Patient was intubated in ED without signs of aspiration Plan: -Full vent support -Minimize sedation for plans to extubate when awake -ABG now, decrease FiO2 is possible -CXR in AM  Bezo overdose/encephalopathy- 3 handfuls of 1mg  Xanax, reported as chronic med Plan: -IVF -airway protection -hemodynamic support/montioring -Follow poison control recommendations -will hold off from reversal of benzos to limit risk of seizure w/d  Suicide precautions Plan: -Place in suicide precautions -Psych eval when able    Best practices / Disposition: -->ICU status under PCCM -->full code -->Heparin for DVT Px -->Protonix for GI Px -->ventilator bundle -->diet NPO  BABCOCK,PETE 07/09/2011, 3:52 PM  CC time 45 minutes.  Benzo OD, patient not extubatable right now.  Will maintain sedated and intubated as needed overnight then will SBT in AM.  Will need psych after extubation as well as suicide precautions and a sitter.  Patient seen and examined, agree with above note.  I dictated the care and orders written for this patient under my direction.  Koren Bound, M.D. [phone]

## 2011-07-09 NOTE — Progress Notes (Signed)
eLink Physician-Brief Progress Note Patient Name: Brenda Rice DOB: 10-22-1955 MRN: [id number]  Date of Service  07/09/2011   HPI/Events of Note   Pt awake and agitated   eICU Interventions  Start propofol drip    Intervention Category Major Interventions: Delirium, psychosis, severe agitation - evaluation and management  Shan Levans 07/09/2011, 6:56 PM

## 2011-07-09 NOTE — ED Notes (Signed)
PCCM MD at bedside; verbal order for soft restraints and repeat Versed 2mg  IV x 1 dose now, Fentanyl IV x 1 dose now. Pt opening eyes and raising arms towards tubes.

## 2011-07-09 NOTE — ED Notes (Signed)
Poison Control called. Recommendations given. Symptomatic supportive care. In addition to CNS and resp depression, can become hypotensive and bradycardic. Respiratory assistance and hydration. Monitor urine output.

## 2011-07-10 DIAGNOSIS — R4182 Altered mental status, unspecified: Secondary | ICD-10-CM

## 2011-07-10 DIAGNOSIS — T43502A Poisoning by unspecified antipsychotics and neuroleptics, intentional self-harm, initial encounter: Secondary | ICD-10-CM

## 2011-07-10 DIAGNOSIS — J9600 Acute respiratory failure, unspecified whether with hypoxia or hypercapnia: Secondary | ICD-10-CM

## 2011-07-10 DIAGNOSIS — T438X2A Poisoning by other psychotropic drugs, intentional self-harm, initial encounter: Secondary | ICD-10-CM

## 2011-07-10 DIAGNOSIS — T424X4A Poisoning by benzodiazepines, undetermined, initial encounter: Principal | ICD-10-CM

## 2011-07-10 LAB — BLOOD GAS, ARTERIAL
Acid-Base Excess: 2 mmol/L (ref 0.0–2.0)
Drawn by: 31288
FIO2: 0.3 %
MECHVT: 500 mL
O2 Saturation: 96.7 %
RATE: 14 resp/min
TCO2: 22.3 mmol/L (ref 0–100)
pCO2 arterial: 36.2 mmHg (ref 35.0–45.0)

## 2011-07-10 LAB — BASIC METABOLIC PANEL
CO2: 27 mEq/L (ref 19–32)
Chloride: 95 mEq/L — ABNORMAL LOW (ref 96–112)
Creatinine, Ser: 0.62 mg/dL (ref 0.50–1.10)
GFR calc Af Amer: >90 mL/min (ref >90)
Potassium: 4.8 mEq/L (ref 3.5–5.1)
Sodium: 133 mEq/L — ABNORMAL LOW (ref 135–145)

## 2011-07-10 LAB — CBC
MCV: 94 fL (ref 78.0–100.0)
Platelets: 146 10*3/uL — ABNORMAL LOW (ref 150–400)
RBC: 3.98 MIL/uL (ref 3.87–5.11)
RDW: 13.3 % (ref 11.5–15.5)
WBC: 8 10*3/uL (ref 4.0–10.5)

## 2011-07-10 MED ORDER — ONDANSETRON HCL 4 MG/2ML IJ SOLN
INTRAMUSCULAR | Status: AC
  Administered 2011-07-10: 4 mg
  Filled 2011-07-10: qty 2

## 2011-07-10 MED ORDER — PANTOPRAZOLE SODIUM 40 MG PO TBEC
40.0000 mg | DELAYED_RELEASE_TABLET | Freq: Every day | ORAL | Status: DC
  Administered 2011-07-10 – 2011-07-11 (×2): 40 mg via ORAL
  Filled 2011-07-10 (×3): qty 1

## 2011-07-10 MED ORDER — PROPOFOL 10 MG/ML IV EMUL
INTRAVENOUS | Status: AC
  Filled 2011-07-10: qty 50

## 2011-07-10 MED ORDER — NICOTINE 21 MG/24HR TD PT24
21.0000 mg | MEDICATED_PATCH | Freq: Every day | TRANSDERMAL | Status: DC
  Administered 2011-07-10 – 2011-07-11 (×2): 21 mg via TRANSDERMAL
  Filled 2011-07-10 (×3): qty 1

## 2011-07-10 MED ORDER — ONDANSETRON HCL 4 MG/2ML IJ SOLN
4.0000 mg | Freq: Three times a day (TID) | INTRAMUSCULAR | Status: DC | PRN
Start: 2011-07-10 — End: 2011-07-11

## 2011-07-10 MED ORDER — TRAMADOL HCL 50 MG PO TABS
100.0000 mg | ORAL_TABLET | Freq: Four times a day (QID) | ORAL | Status: DC | PRN
  Administered 2011-07-10 – 2011-07-11 (×3): 100 mg via ORAL
  Filled 2011-07-10 (×3): qty 2

## 2011-07-10 MED ORDER — ALPRAZOLAM 0.5 MG PO TABS
0.5000 mg | ORAL_TABLET | Freq: Three times a day (TID) | ORAL | Status: DC | PRN
  Administered 2011-07-10: 0.5 mg via ORAL
  Filled 2011-07-10: qty 1

## 2011-07-10 NOTE — Progress Notes (Signed)
Name: Brenda Rice MRN: [id number] DOB: 02-10-1956    LOS: 1  [facility]  History of Present Illness: 56 year old female presents to the ED via EMS for intentional overdose. Patient took 3 handfuls of 1 mg Xanax, that was witnessed by husband. Upon arrival to ED patient had decreased LOC and was subsequently intubated for airway protection.      Lines / Drains: ETT 1/14>>>1/15  Cultures:   Antibiotics:   Tests / Events: Intubated 1/14 Subjective Agitated last night, now awake on SBT. Volumes adequate, appears ready for extubation  Vital Signs: Temp:  [97.6 F (36.4 C)-100.8 F (38.2 C)] 100.2 F (37.9 C) (01/15 0800) Pulse Rate:  [95-131] 95  (01/15 0800) Resp:  [14-27] 14  (01/15 0800) BP: (117-166)/(66-103) 125/66 mmHg (01/15 0800) SpO2:  [98 %-100 %] 98 % (01/15 0800) FiO2 (%):  [30 %-100 %] 30 % (01/15 0814) Weight:  [69 kg (152 lb 1.9 oz)] 69 kg (152 lb 1.9 oz) (01/14 1855)      . sodium chloride 100 mL/hr at 07/10/11 0800  . propofol 29.952 mcg/kg/min (07/10/11 0700)   I/O last 3 completed shifts: In: 1419.9 [I.V.:1369.9; NG/GT:40; IV Piggyback:10] Out: 2280 [Urine:1730; Emesis/NG output:550] Physical Examination: General: 56 yo female intubated in no acute distress Neuro: awake, appropriate, moves all ext w/out focal def HEENT: orally intubated Cardiovascular: RRR, no murmurs Lungs: Clear Abdomen: active BS, soft Musculoskeletal: no edema Skin: intact  Ventilator settings: Vent Mode:  [-] PSV;CPAP FiO2 (%):  [30 %-100 %] 30 % Set Rate:  [14 bmp] 14 bmp Vt Set:  [500 mL] 500 mL PEEP:  [5 cmH20] 5 cmH20 Pressure Support:  [10 cmH20] 10 cmH20 Plateau Pressure:  [16 cmH20-19 cmH20] 19 cmH20  Labs and Imaging:   Lab 07/10/11 0300 07/09/11 1500  NA 133* 126*  K 4.8 4.1  CL 95* 88*  CO2 27 22  BUN 3* 3*  CREATININE 0.62 0.49*  GLUCOSE 90 98    Lab 07/10/11 0300 07/09/11 1500  HGB 13.0 13.9  HCT 37.4 38.7  WBC 8.0 6.9    PLT 146* 153    Assessment and Plan:  Respiratory failure secondary to benzo overdose/alcohol intoxication. F/Vt < 100, no accessory muscle use, follows commands, no distress.  Plan: -extubate -pulm hygiene -minimal sedation -early mobilization -f/u cxr in am  Benzo overdose/alcohol intoxication/encephalopathy- 3 handfuls of 1mg  Xanax, reported as chronic med. Now fully awake.  Plan: -resume low dose benzo to minimize risk of w/d -suicide precaution -psych eval requested  Depression/suicidal ideation Plan: -per psychiatry  Hyponatremia  Lab 07/10/11 0300 07/09/11 1500  NA 133* 126*  Plan: -cont IVF  History of HTN Plan: -monitor blood pressure and restart meds as needed  Tobacco abuse Plan: -nicotine patch -needs smoking cessation  Best practices / Disposition: -->ICU status under PCCM, move to medical ward later today -->full code -->Heparin for DVT Px -->Protonix for GI Px -->extubate -->diet NPO-->advance as tol  BABCOCK,PETE 07/10/2011, 8:26 AM  Patient seen and examined, agree with above note.  I dictated the care and orders written for this patient under my direction.  She has been successfully extubated, and no evidence for untoward effects from overdose.  Will likely be ready to transfer to inpatient psych therapy in near future.  Coralyn Helling, MD 07/10/2011, 4:28 PM Pager:  [phone]
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
## 2011-07-10 NOTE — Progress Notes (Signed)
0845 Pt extubated to 4 lpm nasal cannula with SPO2 98%,HRr 110,RR 21, and BBS = essentially clear with bilateral bases diminished.  Good vocalization.
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
## 2011-07-10 NOTE — Procedures (Signed)
Extubation Procedure Note  Patient Details:   Name: Brenda Rice DOB: September 25, 1955 MRN: [id number]   Airway Documentation:     Evaluation  O2 sats: stable throughout Complications: No apparent complications Patient did tolerate procedure well. Bilateral Breath Sounds: Clear   Yes  Leonides Schanz 07/10/2011, 8:53 AM
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
## 2011-07-11 ENCOUNTER — Inpatient Hospital Stay (HOSPITAL_COMMUNITY)
Admission: AD | Admit: 2011-07-11 | Discharge: 2011-07-12 | DRG: 885 | Disposition: A | Discharge status: Home / Self Care | Payer: Managed Care, Other (non HMO) | Source: Ambulatory Visit | Attending: Psychiatry | Admitting: Psychiatry

## 2011-07-11 ENCOUNTER — Inpatient Hospital Stay: Payer: Self-pay

## 2011-07-11 ENCOUNTER — Inpatient Hospital Stay (HOSPITAL_COMMUNITY): Payer: Managed Care, Other (non HMO)

## 2011-07-11 ENCOUNTER — Encounter (HOSPITAL_COMMUNITY): Payer: Self-pay

## 2011-07-11 DIAGNOSIS — Z79899 Other long term (current) drug therapy: Secondary | ICD-10-CM

## 2011-07-11 DIAGNOSIS — F17200 Nicotine dependence, unspecified, uncomplicated: Secondary | ICD-10-CM

## 2011-07-11 DIAGNOSIS — Z87440 Personal history of urinary (tract) infections: Secondary | ICD-10-CM

## 2011-07-11 DIAGNOSIS — T424X1A Poisoning by benzodiazepines, accidental (unintentional), initial encounter: Secondary | ICD-10-CM | POA: Diagnosis present

## 2011-07-11 DIAGNOSIS — M1710 Unilateral primary osteoarthritis, unspecified knee: Secondary | ICD-10-CM

## 2011-07-11 DIAGNOSIS — F4325 Adjustment disorder with mixed disturbance of emotions and conduct: Secondary | ICD-10-CM

## 2011-07-11 DIAGNOSIS — Z7982 Long term (current) use of aspirin: Secondary | ICD-10-CM

## 2011-07-11 DIAGNOSIS — I10 Essential (primary) hypertension: Secondary | ICD-10-CM

## 2011-07-11 DIAGNOSIS — T438X2A Poisoning by other psychotropic drugs, intentional self-harm, initial encounter: Secondary | ICD-10-CM

## 2011-07-11 DIAGNOSIS — Z888 Allergy status to other drugs, medicaments and biological substances status: Secondary | ICD-10-CM

## 2011-07-11 DIAGNOSIS — T424X4A Poisoning by benzodiazepines, undetermined, initial encounter: Secondary | ICD-10-CM

## 2011-07-11 DIAGNOSIS — F339 Major depressive disorder, recurrent, unspecified: Principal | ICD-10-CM

## 2011-07-11 DIAGNOSIS — T43502A Poisoning by unspecified antipsychotics and neuroleptics, intentional self-harm, initial encounter: Secondary | ICD-10-CM

## 2011-07-11 DIAGNOSIS — IMO0002 Reserved for concepts with insufficient information to code with codable children: Secondary | ICD-10-CM

## 2011-07-11 LAB — CBC
HCT: 35.1 % — ABNORMAL LOW (ref 36.0–46.0)
MCH: 33 pg (ref 26.0–34.0)
MCHC: 34.5 g/dL (ref 30.0–36.0)
MCV: 95.6 fL (ref 78.0–100.0)
Platelets: 149 10*3/uL — ABNORMAL LOW (ref 150–400)
RDW: 13.3 % (ref 11.5–15.5)
WBC: 6.3 10*3/uL (ref 4.0–10.5)

## 2011-07-11 LAB — BASIC METABOLIC PANEL
BUN: 3 mg/dL — ABNORMAL LOW (ref 6–23)
Calcium: 9.1 mg/dL (ref 8.4–10.5)
Chloride: 100 mEq/L (ref 96–112)
Creatinine, Ser: 0.61 mg/dL (ref 0.50–1.10)
GFR calc Af Amer: >90 mL/min (ref >90)
GFR calc non Af Amer: >90 mL/min (ref >90)

## 2011-07-11 MED ORDER — ACETAMINOPHEN 325 MG PO TABS
650.0000 mg | ORAL_TABLET | Freq: Four times a day (QID) | ORAL | Status: DC | PRN
Start: 2011-07-11 — End: 2011-07-12

## 2011-07-11 MED ORDER — MAGNESIUM HYDROXIDE 400 MG/5ML PO SUSP: 30.0000 mL | Freq: Every day | ORAL | Status: DC | PRN

## 2011-07-11 MED ORDER — HYDROXYZINE HCL 50 MG PO TABS
50.0000 mg | ORAL_TABLET | Freq: Every evening | ORAL | Status: DC | PRN
  Administered 2011-07-11: 50 mg via ORAL

## 2011-07-11 MED ORDER — TRAMADOL HCL 50 MG PO TABS
100.0000 mg | ORAL_TABLET | Freq: Four times a day (QID) | ORAL | Status: DC | PRN
  Administered 2011-07-11: 100 mg via ORAL
  Filled 2011-07-11: qty 2

## 2011-07-11 MED ORDER — ALUM & MAG HYDROXIDE-SIMETH 200-200-20 MG/5ML PO SUSP: 30.0000 mL | ORAL | Status: DC | PRN

## 2011-07-11 MED ORDER — NICOTINE 21 MG/24HR TD PT24: 1.0000 | MEDICATED_PATCH | Freq: Every day | TRANSDERMAL | Status: AC

## 2011-07-11 MED ORDER — ASPIRIN EC 81 MG PO TBEC
81.0000 mg | DELAYED_RELEASE_TABLET | Freq: Every day | ORAL | Status: DC
  Administered 2011-07-11 – 2011-07-12 (×2): 81 mg via ORAL
  Filled 2011-07-11 (×2): qty 1

## 2011-07-11 MED ORDER — LISINOPRIL 40 MG PO TABS
40.0000 mg | ORAL_TABLET | Freq: Every day | ORAL | Status: DC
  Administered 2011-07-11 – 2011-07-12 (×2): 40 mg via ORAL
  Filled 2011-07-11 (×3): qty 1
  Filled 2011-07-11: qty 2

## 2011-07-11 MED ORDER — HYDROXYZINE PAMOATE 25 MG PO CAPS
50.0000 mg | ORAL_CAPSULE | Freq: Every evening | ORAL | Status: DC | PRN
  Filled 2011-07-11: qty 2

## 2011-07-11 MED ORDER — TRAMADOL HCL 50 MG PO TABS: 100.0000 mg | ORAL_TABLET | Freq: Four times a day (QID) | ORAL | Status: DC | PRN

## 2011-07-11 MED ORDER — NICOTINE 21 MG/24HR TD PT24
21.0000 mg | MEDICATED_PATCH | Freq: Every day | TRANSDERMAL | Status: DC
  Administered 2011-07-12: 21 mg via TRANSDERMAL
  Filled 2011-07-11 (×2): qty 1
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
## 2011-07-11 NOTE — Tx Team (Signed)
Initial Interdisciplinary Treatment Plan  PATIENT STRENGTHS: (choose at least two) Ability for insight Average or above average intelligence  PATIENT STRESSORS: Marital or family conflict Traumatic event   PROBLEM LIST: Problem List/Patient Goals Date to be addressed Date deferred Reason deferred Estimated date of resolution  Major Depression 07/11/11                                                      DISCHARGE CRITERIA:  Ability to meet basic life and health needs Adequate post-discharge living arrangements Improved stabilization in mood, thinking, and/or behavior Motivation to continue treatment in a less acute level of care Need for constant or close observation no longer present Reduction of life-threatening or endangering symptoms to within safe limits Safe-care adequate arrangements made Verbal commitment to aftercare and medication compliance  PRELIMINARY DISCHARGE PLAN: Return to previous living arrangement  PATIENT/FAMIILY INVOLVEMENT: This treatment plan has been presented to and reviewed with the patient, Brenda Rice, and/or family member The patient and family have been given the opportunity to ask questions and make suggestions.  Brenda Rice 07/11/2011, 11:13 PM

## 2011-07-11 NOTE — Discharge Summary (Signed)
DISCHARGE SUMMARY  Brenda Rice  MR#: [id number]  DOB:03-03-1956  Date of Admission: 07/09/2011 Date of Discharge: 07/11/2011  Attending Physician:Ulice Follett  Patient's PCP:No primary provider on file.  Consults: PCCM  Discharge Diagnoses: #1 vent dependent respiratory failure secondary to drug overdose status post extubation. #2 benzodiazepine overdose questionable suicide attempt. #3 hypertension #4 history of chronic back pain #5 hyponatremia #6 thrombocytopenia #7 chronic tobacco use  Present on Admission:  .Respiratory failure .Overdose of benzodiazepine    Current Discharge Medication List    START taking these medications   Details  nicotine (NICODERM CQ - DOSED IN MG/24 HOURS) 21 mg/24hr patch Place 1 patch (21 patches total) onto the skin daily. Qty: 28 patch, Refills: 1    traMADol (ULTRAM) 50 MG tablet Take 2 tablets (100 mg total) by mouth every 6 (six) hours as needed for pain. Qty: 30 tablet, Refills: 1      CONTINUE these medications which have NOT CHANGED   Details  aspirin EC 81 MG tablet Take 81 mg by mouth daily.    citalopram (CELEXA) 20 MG tablet Take 20 mg by mouth daily.    lisinopril (PRINIVIL,ZESTRIL) 40 MG tablet Take 40 mg by mouth daily.      STOP taking these medications     ALPRAZolam (XANAX) 1 MG tablet           Hospital Course: Patient is a 56 year old Caucasian female with history of hypertension. She was admitted to the hospital on 07/09/2011 obtunded after taking 3 handful of Xanax. This was said to have been witnessed by spouse. Patient was evaluated by PCCM and subsequently intubated to protect the airway.     Patient was admitted to ICU by PCCM , intubated and mechanically ventilated and management essentially was done by PCCM. After extubation, patient was transferred to triad hospitalist service for further management. Patient was also evaluated by the in-house psychiatrist post extubation  recommended that when  patient is clinically stable she'll be transferred to [facility].     Patient was seen by me today for the first time 07/11/2011. Complain of mild pain in the lower back. Denies any systemic symptoms. Denies any suicidal or homicidal ideations. Examination of patient showed patient to be awake, alert and oriented. Vital signs are stable. Plan is for patient to be discharged to behavioral health center.   Day of Discharge BP 152/82  Pulse 89  Temp(Src) 97.7 F (36.5 C) (Oral)  Resp 20  Ht 5\' 2"  (1.575 m)  Wt 74 kg (163 lb 2.3 oz)  BMI 29.84 kg/m2  SpO2 98%  Physical Exam: Vitals as above. HEENT-pupils are reactive to light and extraocular muscles are intact. Neck-no JVD Chest-clear to auscultation CVS-S1 and S2 Abdomen-soft, nontender, no organomegaly, bowel sounds are positive. Extremity-no pedal edema Neuro-nonfocal Skin-no ecchymosis Neuropsych-appropriate affect.   Results for orders placed during the hospital encounter of 07/09/11 (from the past 24 hour(s))  CBC     Status: Abnormal   Collection Time   07/11/11  5:32 AM      Component Value Range   WBC 6.3  4.0 - 10.5 (K/uL)   RBC 3.67 (*) 3.87 - 5.11 (MIL/uL)   Hemoglobin 12.1  12.0 - 15.0 (g/dL)   HCT 14.7 (*) 82.9 - 46.0 (%)   MCV 95.6  78.0 - 100.0 (fL)   MCH 33.0  26.0 - 34.0 (pg)   MCHC 34.5  30.0 - 36.0 (g/dL)   RDW 56.2  13.0 - 86.5 (%)  Platelets 149 (*) 150 - 400 (K/uL)  BASIC METABOLIC PANEL     Status: Abnormal   Collection Time   07/11/11  5:32 AM      Component Value Range   Sodium 135  135 - 145 (mEq/L)   Potassium 3.3 (*) 3.5 - 5.1 (mEq/L)   Chloride 100  96 - 112 (mEq/L)   CO2 25  19 - 32 (mEq/L)   Glucose, Bld 89  70 - 99 (mg/dL)   BUN 3 (*) 6 - 23 (mg/dL)   Creatinine, Ser 1.61  0.50 - 1.10 (mg/dL)   Calcium 9.1  8.4 - 09.6 (mg/dL)   GFR calc non Af Amer >90  >90 (mL/min)   GFR calc Af Amer >90  >90 (mL/min)    Disposition: stable   Follow-up Appts: Discharge Orders    Future Orders Please  Complete By Expires   Diet - low sodium heart healthy      Increase activity slowly      Discharge instructions      Comments:   Follow up with pcp in 1-2weeks       Signed: Berenis Corter 07/11/2011, 3:14 PM
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
## 2011-07-11 NOTE — Progress Notes (Signed)
Per [facility], bed ready for Pt.  Notified MD.  After seeing Pt, MD feels that Pt is ready for d/c.  MD to write d/c summary.  Notified Pt and RN.  Pt signed consents.  Faxed consents to [facility].  Pt to be d/c'd to [facility].  Providence Crosby, LCSWA Clinical Social Work [phone]

## 2011-07-11 NOTE — Progress Notes (Signed)
Voluntary admission for a 56 y.o. Female with flat affect, depressed mood.  Pt. Reported that she overdosed on Xanax on Saturday night because she found out her husband of 39 years has been cheating on her.  Pt. Denies SI/HI and denies A/V hallucinations and contracts for safety.  Reports that she has not been able to eat in 3 days.  Food given to pt. But pt. Said she was not able to eat at this time. Medical Hx. HTN, Arthritis, Depression.  Pt. Oriented to unit.

## 2011-07-11 NOTE — Progress Notes (Signed)
Per [facility], bed available.  Notified MD.  MD states that he's not comfortable d/c'ing Pt today, as he doesn't know her well enough to make that determination.  CSW to continue to follow.  Providence Crosby, LCSWA Clinical Social Work [phone]

## 2011-07-12 ENCOUNTER — Encounter: Payer: Self-pay | Admitting: Family Medicine

## 2011-07-12 DIAGNOSIS — I10 Essential (primary) hypertension: Secondary | ICD-10-CM | POA: Insufficient documentation

## 2011-07-12 DIAGNOSIS — T424X4A Poisoning by benzodiazepines, undetermined, initial encounter: Secondary | ICD-10-CM

## 2011-07-12 DIAGNOSIS — T438X2A Poisoning by other psychotropic drugs, intentional self-harm, initial encounter: Secondary | ICD-10-CM

## 2011-07-12 DIAGNOSIS — F339 Major depressive disorder, recurrent, unspecified: Principal | ICD-10-CM

## 2011-07-12 DIAGNOSIS — T43502A Poisoning by unspecified antipsychotics and neuroleptics, intentional self-harm, initial encounter: Secondary | ICD-10-CM

## 2011-07-12 LAB — TRICYCLIC ANTIDEPRESSANT EVALUATION
Amitriptyline: <5 ng/mL
Grand Total Tricyclics: NOT DETECTED not reported
Imipramine IPRAM: <5 ng/mL

## 2011-07-12 MED ORDER — CARBAMAZEPINE ER 200 MG PO TB12
600.0000 mg | ORAL_TABLET | Freq: Every day | ORAL | Status: DC
  Administered 2011-07-12: 600 mg via ORAL
  Filled 2011-07-12: qty 1
  Filled 2011-07-12: qty 3
  Filled 2011-07-12: qty 2
  Filled 2011-07-12: qty 3

## 2011-07-12 MED ORDER — ONDANSETRON HCL 4 MG PO TABS: 4.0000 mg | ORAL_TABLET | Freq: Three times a day (TID) | ORAL | Status: AC | PRN

## 2011-07-12 MED ORDER — CARBAMAZEPINE ER 200 MG PO TB12: 600.0000 mg | ORAL_TABLET | Freq: Every day | ORAL | Status: DC

## 2011-07-12 MED ORDER — CITALOPRAM HYDROBROMIDE 20 MG PO TABS: 20.0000 mg | ORAL_TABLET | Freq: Every day | ORAL | Status: DC

## 2011-07-12 NOTE — Progress Notes (Signed)
[facility] Adult Inpatient Family/Significant Other Suicide Prevention Education  Suicide Prevention Education:  Contact Attempts:Donald Siva, friend,  has been identified by the patient as the family member/significant other  who will aid the patient in the event of a mental health crisis.  With written consent from the patient, two attempts were made to provide suicide prevention education, prior to and/or following the patient's discharge.  We were unsuccessful in providing suicide prevention education.  A suicide education pamphlet was given to the patient to share with family/significant other.  Date and time of first attempt:  07/12/11@11 :101 Date and time of second attempt:07/12/11 @ 218pm  Billie Lade 07/12/2011, 2:27 PM

## 2011-07-12 NOTE — Discharge Summary (Signed)
Patient ID: Brenda Rice MRN: [id number] DOB/AGE: [birth year] 56 y.o.  Admit date: 07/11/2011 Discharge date: 07/12/2011  Reason for Admission: Suicide attempt by overdose   Hospital Course: Overdosed on about 5 1mg  Xanax she had bought off the street after asking her husband to leave. He lost his employment back in June and has made it her job to find him another job.  Before she left fo rwork Sunday evening she checked the computer and found all the charges he was making. She had been unaware of his activities while she aws at work . She became enraged and went driving around trying to spot him. When he returned home she overdosed on Xanax she had brought from the street. Patient was here for less that 24 hours.  She reports that she has come to the understanding in the last few weeks that she does not need to die to change her situation and came into the hospital for positive reinforcement along the path she plans to take to independence. States that [facility] helped her deal with a problem and make a big turn in her life before (substance abuse) and she knew she needed that help to make a similarly big decision. Ms. Bureau is discharged to her home with all belongings. She is to follow-up on an out-patient basis at the [facility] counseling services.  Discharge Diagnoses:  AXIS I Suicide attempt by overdose on benzodiazepine, Mjor depressive disorder, recurrent  AXIS II Deferred  AXIS III Past Medical History  Diagnosis Date  . Hypertension   . Depression   . Suicide attempt     x 3  . Osteoarthritis     right hip  . Fx clavicle     Hx of left clavicle fx  . Hx: UTI (urinary tract infection)     AXIS IV other psychosocial or environmental problems  AXIS V 61-70 mild symptoms   Condition on  Discharge:  Suicidal Ideation:   Plan:  No  Intent:  No  Means:  No  Homicidal Ideation:   Plan:  No  Intent:  No  Means:  No  Mental Status: General Appearance /Behavior:  Neat Eye  Contact:  Good Motor Behavior:  Normal Speech:  Normal Level of Consciousness:  Alert Mood:  1 on a scale of 1 is the least and 10 is the most Affect:  Appropriate Anxiety Level:  1 on a scale of 1 is the least and 10 is the most Thought Process:  Coherent Thought Content:  WNL Perception:  Normal Judgment:  Good Insight:  Present Cognition:  Orientation time, place and person Concentration Yes Sleep:     Vital Signs:Blood pressure 143/90, pulse 109, temperature 99.4 F (37.4 C), temperature source Oral, resp. rate 16, height 5\' 3"  (1.6 m), weight 68.493 kg (151 lb). Lab Results: Results for orders placed during the hospital encounter of 07/09/11 (from the past 48 hour(s))  CBC     Status: Abnormal   Collection Time   07/11/11  5:32 AM      Component Value Range Comment   WBC 6.3  4.0 - 10.5 (K/uL)    RBC 3.67 (*) 3.87 - 5.11 (MIL/uL)    Hemoglobin 12.1  12.0 - 15.0 (g/dL)    HCT 40.9 (*) 81.1 - 46.0 (%)    MCV 95.6  78.0 - 100.0 (fL)    MCH 33.0  26.0 - 34.0 (pg)    MCHC 34.5  30.0 - 36.0 (g/dL)    RDW 13.3  11.5 - 15.5 (%)    Platelets 149 (*) 150 - 400 (K/uL)   BASIC METABOLIC PANEL     Status: Abnormal   Collection Time   07/11/11  5:32 AM      Component Value Range Comment   Sodium 135  135 - 145 (mEq/L)    Potassium 3.3 (*) 3.5 - 5.1 (mEq/L)    Chloride 100  96 - 112 (mEq/L)    CO2 25  19 - 32 (mEq/L)    Glucose, Bld 89  70 - 99 (mg/dL)    BUN 3 (*) 6 - 23 (mg/dL)    Creatinine, Ser 1.61  0.50 - 1.10 (mg/dL)    Calcium 9.1  8.4 - 10.5 (mg/dL)    GFR calc non Af Amer >90  >90 (mL/min)    GFR calc Af Amer >90  >90 (mL/min)      Reports what they have learned from this hospital stay is "I am number one and I have got a plan.  Now I just need to carry it out."  "I am grateful.  I knew that you helped me the last time and you can help me again."  Risk of harm to self is elevated by her history of 3 prior attempts, but now she has conviction that she is alive and  wants to stay that way for as long as her body will hold out.  She has gradually come to realize that she no longer has to live with an unfaithful husband and she has a life of her own and she needs to live it to the fullest.  Risk of harm to others she has not had charges against her for threats or plans of harm towards others.  Plan:   Medication List  As of 07/12/2011  1:41 PM   STOP taking these medications         traMADol 50 MG tablet         TAKE these medications         aspirin EC 81 MG tablet   Take 81 mg by mouth daily.      carbamazepine 200 MG 12 hr tablet   Commonly known as: TEGRETOL XR   Take 3 tablets (600 mg total) by mouth daily.      citalopram 20 MG tablet   Commonly known as: CELEXA   Take 1 tablet (20 mg total) by mouth daily.      lisinopril 40 MG tablet   Commonly known as: PRINIVIL,ZESTRIL   Take 40 mg by mouth daily.      nicotine 21 mg/24hr patch   Commonly known as: NICODERM CQ - dosed in mg/24 hours   Place 1 patch (21 patches total) onto the skin daily.      ondansetron 4 MG tablet   Commonly known as: ZOFRAN   Take 1 tablet (4 mg total) by mouth every 8 (eight) hours as needed for nausea.            SignedDan Humphreys, EDWIN 07/12/2011, 1:41 PM

## 2011-07-12 NOTE — Progress Notes (Addendum)
Adult Psychosocial Assessment Update Interdisciplinary Team  Previous [facility] Hospital admissions/discharges:  Admissions Discharges  Date:   04/03/11 Date:     04/06/11  Date: Date:  Date: Date:  Date: Date:  Date: Date:   Changes since the last Psychosocial Assessment (including adherence to outpatient mental health and/or substance abuse treatment, situational issues contributing to decompensation and/or relapse). Reports she used hospital as an excuse to make decisions she has been contemplating  For a few weeks regarding divorcing husband and taking steps to distance herself from   Him. After 39 years of marriage found out husband has been cheating, stealing money   And doing drugs. Overdosed on Xanax she has been buying off the street in an attempt  At suicide as she thought "why should I be around?" if husband is going to continue doing  These things.   Discharge Plan 1. Will you be returning to the same living situation after discharge?   Yes:     No:      If no, what is your plan?     X    Plans to take all her money out of the bank, pack her things and get herself a place to   Stay. Also will request transfer of job to [place] where she plans to live with/near sister.      2. Would you like a referral for services when you are discharged? Yes:   X  If yes, for what services? Psychiatry and therapy  No:       Has not been seeing anyone regularly for mental health services. Used to see "a woman  Off of [employer] and didn't like her, but cannot remember who this was.      Summary and Recommendations (to be completed by the evaluator) Brenda Rice is a 56 year old married female diagnosed with Major Depression. She reports  That her overdose was a suicide attempt in response to finding out that her husband had  Been cheating,using drugs and using her money to buy frivolous things since he lost his  Job in the summer.Brenda Rice has been using Xanax which she buys  illegally, but does not  consider this drug abuse because she only uses 1 in the morning and 1 in the evening.   She is very focused on leaving husband and making her own life in [place] near sister.   Brenda Rice would benefit from crisis stabilization, medication evaluation, therapy groups for   Processing thoughts/ feelings/experiences, psychoed groups for coping skills and case   Management for discharge planning.      Signature:  Brenda Rice, 07/12/2011 8:35 AM

## 2011-07-12 NOTE — Tx Team (Signed)
Interdisciplinary Treatment Plan Update (Adult)  Date:  07/12/2011  Time Reviewed:  10:06 AM   Progress in Treatment: Attending groups: Yes Participating in groups:  Yes Taking medication as prescribed:  Yes Tolerating medication: Yes Family/Significant othe contact made: No, requesting consent to make contact with family Patient understands diagnosis: No, reports she was suicidal, but then states that she only came here as an excuse to make a split from her husband Discussing patient identified problems/goals with staff:  Yes Medical problems stabilized or resolved: Yes Denies suicidal/homicidal ideation: Yes Issues/concerns per patient self-inventory:  No  Other:  New problem(s) identified: None  Reason for Continuation of Hospitalization: appropriate for discharge today   Interventions implemented related to continuation of hospitalization:  Medication stabilization, safety checks q 15 mins, group attendance  Additional comments: Reports that she has come to the understanding in the last few weeks that she does not need to die to change her situation and came into the hospital for positive reinforcement along the path she plans to take to independence. States that [facility] helped her deal with a problem and make a big turn in her life before (substance abuse) and she knew she needed that help to make a similarly big decision.  Estimated length of stay: discharge home today  Discharge Plan: Discharge home, follow up to be made with therapist and psychiatrist in [facility]):  Review of initial/current patient goals per problem list:   1.  Goal(s): Decrease depressive symptoms  Met:  Yes  Target date: by discharge  As evidenced by: Brenda Rice rates depression at a 2 today  2.  Goal (s): Reduce potential for self-harm behaviors  Met:  Yes  Target date: by discharge  As evidenced by: Brenda Rice reports no suicidal thoughts today  3.  Goal(s):  Stabilize  medications  Met:  Yes  Target date: by discharge  As evidenced by:  Brenda Rice reports feeling stable on current medications   Attendees: Patient:  Brenda Rice 07/12/2011 10:06 AM  Family:   07/12/2011 10:06 AM  Physician:  Dr Orson Aloe, MD 07/12/2011 10:06 AM  Nursing:   Izola Price, RN  07/12/2011 10:06 AM  Case Manager:  Juline Patch, LCSW 07/12/2011 10:06 AM  Counselor:  Angus Palms, LCSW 07/12/2011 10:06 AM  Other:  Reyes Ivan, LCSWA 07/12/2011 10:06 AM  Other:  Wilmon Arms, counseling intern 07/12/2011 10:06 AM  Other:  Omelia Blackwater, RN  07/12/2011 10:06 AM  Other:   07/12/2011 10:06 AM   Scribe for Treatment Team:   Billie Lade, 07/12/2011 10:06 AM

## 2011-07-12 NOTE — Progress Notes (Signed)
[facility] Group Notes:  (Counselor/Nursing/MHT/Case Management/Adjunct)  07/12/2011 1:42 PM  Type of Therapy:  Group Therapy  Participation Level:  Did Not Attend  Brenda Rice 07/12/2011, 1:42 PM  Brenda Rice 07/12/2011  2:28 PM

## 2011-07-12 NOTE — Progress Notes (Signed)
Patient discharged to home today.  Reviewed all discharge instructions, medications, and follow up care.  Patient verbalized understanding of all.  All belongings returned to patient.  Patient denies suicidal ideation, or thoughts of self harm.  Escorted off the unit and out to the front lobby.  Friend was here to pick her up.

## 2011-07-12 NOTE — Progress Notes (Signed)
Lying quietly in bed with eyes closed.  Room smells strongly of cigarette smoke but admitting nurse says smell same degree as was present at admission.  Safety maintained via Q 15 min. activity & location safety checks.

## 2011-07-12 NOTE — [facility] Suicide Risk Assessment (Signed)
Suicide Risk Assessment  Admission Assessment      See discharge Suicide Risk Assessment  Brenda Rice 07/12/2011, 1:27 PM

## 2011-07-12 NOTE — [facility] Suicide Risk Assessment (Signed)
Suicide Risk Assessment  Discharge Assessment     Demographic factors:  Assessment Details Time of Assessment: Admission Information Obtained From: Patient Current Mental Status:  Current Mental Status:  (denies) Risk Reduction Factors:  Risk Reduction Factors: Employed  CLINICAL FACTORS:   Severe Anxiety and/or Agitation Dysthymia Alcohol/Substance Abuse/Dependencies Previous Psychiatric Diagnoses and Treatments Medical Diagnoses and Treatments/Surgeries  COGNITIVE FEATURES THAT CONTRIBUTE TO RISK:  Thought constriction (tunnel vision)    SUICIDE RISK:   Minimal: No identifiable suicidal ideation.  Patients presenting with no risk factors but with morbid ruminations; may be classified as minimal risk based on the severity of the depressive symptoms  Suicidal Ideation:   Plan:  No  Intent:  No  Means:  No  Homicidal Ideation:   Plan:  No  Intent:  No  Means:  No  Mental Status: General Appearance /Behavior:  Neat Eye Contact:  Good Motor Behavior:  Normal Speech:  Normal Level of Consciousness:  Alert Mood:  1 on a scale of 1 is the least and 10 is the most Affect:  Appropriate Anxiety Level:  1 on a scale of 1 is the least and 10 is the most Thought Process:  Coherent Thought Content:  WNL Perception:  Normal Judgment:  Good Insight:  Present Cognition:  Orientation time, place and person Concentration Yes Sleep:     Vital Signs:Blood pressure 143/90, pulse 109, temperature 99.4 F (37.4 C), temperature source Oral, resp. rate 16, height 5\' 3"  (1.6 m), weight 68.493 kg (151 lb). Lab Results: Results for orders placed during the hospital encounter of 07/09/11 (from the past 48 hour(s))  CBC     Status: Abnormal   Collection Time   07/11/11  5:32 AM      Component Value Range Comment   WBC 6.3  4.0 - 10.5 (K/uL)    RBC 3.67 (*) 3.87 - 5.11 (MIL/uL)    Hemoglobin 12.1  12.0 - 15.0 (g/dL)    HCT 82.9 (*) 56.2 - 46.0 (%)    MCV 95.6  78.0 - 100.0 (fL)      MCH 33.0  26.0 - 34.0 (pg)    MCHC 34.5  30.0 - 36.0 (g/dL)    RDW 13.0  86.5 - 78.4 (%)    Platelets 149 (*) 150 - 400 (K/uL)   BASIC METABOLIC PANEL     Status: Abnormal   Collection Time   07/11/11  5:32 AM      Component Value Range Comment   Sodium 135  135 - 145 (mEq/L)    Potassium 3.3 (*) 3.5 - 5.1 (mEq/L)    Chloride 100  96 - 112 (mEq/L)    CO2 25  19 - 32 (mEq/L)    Glucose, Bld 89  70 - 99 (mg/dL)    BUN 3 (*) 6 - 23 (mg/dL)    Creatinine, Ser 6.96  0.50 - 1.10 (mg/dL)    Calcium 9.1  8.4 - 10.5 (mg/dL)    GFR calc non Af Amer >90  >90 (mL/min)    GFR calc Af Amer >90  >90 (mL/min)      Reports that they have learned from this hospital stay is "I am number one and I have got a plan.  Now I just need to carry it out."  "I am grateful.  I knew that you helped me the last time and you can help me again."  Risk of harm to self is elevated by her history of 3 prior attempts, but now  she has conviction that she is alive and wants to stay that way for as long as her body will hold out.  She has gradually come to realize that she no longer has to live with an unfaithful husband and she has a life of her own and she needs to live it to the fullest.  Risk of harm to others she has not had charges against her for threats or plans of harm towards others.  Continue . aspirin EC  81 mg Oral Daily  . carbamazepine  600 mg Oral Daily  . lisinopril  40 mg Oral Daily  . nicotine  21 mg Transdermal Daily  Because Aspirin is for stroke and heart attack prevention, Tegretol is for mood regulation and anxiety, Prinivil is for control of high blood pressure, Nicotine is for smoking cessation.  Sterling Mondo 07/12/2011, 11:46 AM

## 2011-07-12 NOTE — Discharge Summary (Signed)
Discharge Note  Patient:  Brenda Rice is an 56 y.o., female DOB:  21-Sep-1955  Date of Admission:  07/11/2011  Date of Discharge:  07/12/2011  Level of Care:  OP  Discharge destination:  HOME  Is patient on multiple antipsychotic therapies at discharge:  NO  Patient phone:  [phone] (home) Patient address:   [address] [place] [address]  The patient received suicide prevention pamphlet:  YES Belongings returned:  Judene Companion, Danile Trier 07/12/2011,12:02 PM

## 2011-07-13 NOTE — Progress Notes (Signed)
Patient Discharge Instructions:  Admission Note Faxed,  07/13/2011 Discharge Note Faxed,   07/13/2011 After Visit Summary Faxed,  07/13/2011 Faxed to the Next Level Care provider:  07/13/2011 Facesheet faxed 07/13/2011   Faxed to [facility] @ [phone]  Heloise Purpura, Eduard Clos, 07/13/2011, 1:03 PM

## 2011-09-02 ENCOUNTER — Ambulatory Visit: Payer: Managed Care, Other (non HMO) | Admitting: Family Medicine

## 2011-09-02 ENCOUNTER — Ambulatory Visit: Payer: Managed Care, Other (non HMO)

## 2011-09-02 VITALS — BP 128/81 | HR 126 | Temp 99.8°F | Resp 18 | Ht 62.5 in | Wt 147.0 lb

## 2011-09-02 DIAGNOSIS — J189 Pneumonia, unspecified organism: Secondary | ICD-10-CM

## 2011-09-02 DIAGNOSIS — F17200 Nicotine dependence, unspecified, uncomplicated: Secondary | ICD-10-CM | POA: Insufficient documentation

## 2011-09-02 DIAGNOSIS — R05 Cough: Secondary | ICD-10-CM

## 2011-09-02 DIAGNOSIS — D696 Thrombocytopenia, unspecified: Secondary | ICD-10-CM

## 2011-09-02 LAB — POCT CBC
Granulocyte percent: 58.6 %G (ref 37–80)
HCT, POC: 40.6 % (ref 37.7–47.9)
Lymph, poc: 1.4 (ref 0.6–3.4)
MCHC: 32 g/dL (ref 31.8–35.4)
MID (cbc): 1.1 — AB (ref 0–0.9)
MPV: 9.5 fL (ref 0–99.8)
POC Granulocyte: 3.6 (ref 2–6.9)
POC LYMPH PERCENT: 23 %L (ref 10–50)
POC MID %: 18.4 %M — AB (ref 0–12)
Platelet Count, POC: 100 10*3/uL — AB (ref 142–424)
RDW, POC: 14.1 %

## 2011-09-02 MED ORDER — AZITHROMYCIN 500 MG PO TABS: 500.0000 mg | ORAL_TABLET | Freq: Every day | ORAL | Status: AC

## 2011-09-02 MED ORDER — BENZONATATE 100 MG PO CAPS: 100.0000 mg | ORAL_CAPSULE | Freq: Three times a day (TID) | ORAL | Status: AC | PRN

## 2011-09-02 NOTE — Progress Notes (Signed)
  Subjective:    Patient ID: Brenda Rice, female    DOB: 1956/06/23, 56 y.o.   MRN: [id number]  Cough Associated symptoms include chills, a fever and headaches. Pertinent negatives include no ear pain or sore throat.  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  She began feeling ill 4 days ago with chills, fatigue.  Smoking 1ppd.  Vomiting only with cough this am.  No diarrhea or CP.  No history of pneumonia.    Review of Systems  Constitutional: Positive for fever and chills.  HENT: Negative for ear pain, sore throat, neck stiffness and sinus pressure.   Eyes: Negative.   Respiratory: Positive for cough.   Gastrointestinal: Positive for vomiting.  Musculoskeletal: Negative.   Skin: Negative.   Neurological: Positive for headaches. Negative for weakness.  Hematological: Negative.   Psychiatric/Behavioral: Negative.        Objective:   Physical Exam  Constitutional: She is oriented to person, place, and time. She appears well-developed and well-nourished. No distress.  HENT:  Head: Normocephalic.  Mouth/Throat: No oropharyngeal exudate.       Mucous membrans moist  Cardiovascular: Regular rhythm and normal heart sounds.        tachycardic  Pulmonary/Chest: Effort normal. No respiratory distress. She has no wheezes. She has rales.  Abdominal: Soft.  Lymphadenopathy:    She has no cervical adenopathy.  Neurological: She is alert and oriented to person, place, and time.  Skin: She is not diaphoretic.  Psychiatric: She has a normal mood and affect. Her behavior is normal.  Pt appears mildly ill, not toxic.    [facility] reading (PRIMARY) by  Dr. Milus Glazier.   Increased markings in right lower lobe c/w pneumonia.      Assessment & Plan:  1.  RLL pneumonia  Azithromycin 500 mg QD X 5 days.  Tessalon Perles as needed.  Liberal fluids.  Work on getting off the cigarettes long term. 2.  Low platelet count (thrombocytopenia).  Recheck in 5-7 days per our plan, possibly  lab error but needs to be rechecked.  Pt agrees.

## 2011-09-02 NOTE — Patient Instructions (Addendum)
Return in 4 days to recheck your platelets.  You have a RLL pneumonia.  Take your azithromycin an tessalon perles as prescribed.   Pneumonia, Adult Pneumonia is an infection of the lungs.  CAUSES Pneumonia may be caused by bacteria or a virus. Usually, these infections are caused by breathing infectious particles into the lungs (respiratory tract). SYMPTOMS   Cough.   Fever.   Chest pain.   Increased rate of breathing.   Wheezing.   Mucus production.  DIAGNOSIS  If you have the common symptoms of pneumonia, your caregiver will typically confirm the diagnosis with a chest X-ray. The X-ray will show an abnormality in the lung (pulmonary infiltrate) if you have pneumonia. Other tests of your blood, urine, or sputum may be done to find the specific cause of your pneumonia. Your caregiver may also do tests (blood gases or pulse oximetry) to see how well your lungs are working. TREATMENT  Some forms of pneumonia may be spread to other people when you cough or sneeze. You may be asked to wear a mask before and during your exam. Pneumonia that is caused by bacteria is treated with antibiotic medicine. Pneumonia that is caused by the influenza virus may be treated with an antiviral medicine. Most other viral infections must run their course. These infections will not respond to antibiotics.  PREVENTION A pneumococcal shot (vaccine) is available to prevent a common bacterial cause of pneumonia. This is usually suggested for:  People over 46 years old.   Patients on chemotherapy.   People with chronic lung problems, such as bronchitis or emphysema.   People with immune system problems.  If you are over 65 or have a high risk condition, you may receive the pneumococcal vaccine if you have not received it before. In some countries, a routine influenza vaccine is also recommended. This vaccine can help prevent some cases of pneumonia.You may be offered the influenza vaccine as part of your  care. If you smoke, it is time to quit. You may receive instructions on how to stop smoking. Your caregiver can provide medicines and counseling to help you quit. HOME CARE INSTRUCTIONS   Cough suppressants may be used if you are losing too much rest. However, coughing protects you by clearing your lungs. You should avoid using cough suppressants if you can.   Your caregiver may have prescribed medicine if he or she thinks your pneumonia is caused by a bacteria or influenza. Finish your medicine even if you start to feel better.   Your caregiver may also prescribe an expectorant. This loosens the mucus to be coughed up.   Only take over-the-counter or prescription medicines for pain, discomfort, or fever as directed by your caregiver.   Do not smoke. Smoking is a common cause of bronchitis and can contribute to pneumonia. If you are a smoker and continue to smoke, your cough may last several weeks after your pneumonia has cleared.   A cold steam vaporizer or humidifier in your room or home may help loosen mucus.   Coughing is often worse at night. Sleeping in a semi-upright position in a recliner or using a couple pillows under your head will help with this.   Get rest as you feel it is needed. Your body will usually let you know when you need to rest.  SEEK IMMEDIATE MEDICAL CARE IF:   Your illness becomes worse. This is especially true if you are elderly or weakened from any other disease.   You cannot control  your cough with suppressants and are losing sleep.   You begin coughing up blood.   You develop pain which is getting worse or is uncontrolled with medicines.   You have a fever.   Any of the symptoms which initially brought you in for treatment are getting worse rather than better.   You develop shortness of breath or chest pain.  MAKE SURE YOU:   Understand these instructions.   Will watch your condition.   Will get help right away if you are not doing well or get  worse.  Document Released: 06/11/2005 Document Revised: 05/31/2011 Document Reviewed: 08/31/2010 [facility] Patient Information 2012 [place], [place].

## 2011-09-03 ENCOUNTER — Encounter: Payer: Self-pay | Admitting: Physician Assistant

## 2011-10-06 ENCOUNTER — Telehealth: Payer: Self-pay

## 2011-10-06 NOTE — Telephone Encounter (Signed)
.  [facility] Patient called to request that Rx refill for Tramadol be called in to pharmacy.  Pt stated that Dr. Patsy Lager is her primary care doctor but her orthopaedic doctor originally wrote the Rx.  Patient stated that she fell last night and is having hip pain.  Advised patient that we were happy to see her today, but she stated she would wait to see Dr. Patsy Lager on Monday 10/08/11.  Advised patient that she should seek medical attention if pain increases, range of motion decreases, or if she has any untoward symptoms or problems. Please call patient at [phone] to discuss this matter.

## 2011-10-08 NOTE — Telephone Encounter (Signed)
CALLED PT AT BOTH NUMBERS LISTED--UNABLE TO REACH HER AT EITHER NUMBER.

## 2012-05-28 ENCOUNTER — Other Ambulatory Visit: Payer: Self-pay

## 2012-05-28 MED ORDER — LISINOPRIL 40 MG PO TABS: 40.0000 mg | ORAL_TABLET | Freq: Every day | ORAL | Status: DC

## 2012-06-25 DIAGNOSIS — Z0271 Encounter for disability determination: Secondary | ICD-10-CM

## 2012-07-01 ENCOUNTER — Ambulatory Visit (INDEPENDENT_AMBULATORY_CARE_PROVIDER_SITE_OTHER): Payer: Managed Care, Other (non HMO) | Admitting: Emergency Medicine

## 2012-07-01 VITALS — BP 180/110 | HR 127 | Temp 98.3°F | Resp 20 | Ht 62.5 in | Wt 154.6 lb

## 2012-07-01 DIAGNOSIS — G43909 Migraine, unspecified, not intractable, without status migrainosus: Secondary | ICD-10-CM

## 2012-07-01 DIAGNOSIS — I10 Essential (primary) hypertension: Secondary | ICD-10-CM

## 2012-07-01 DIAGNOSIS — G43109 Migraine with aura, not intractable, without status migrainosus: Secondary | ICD-10-CM

## 2012-07-01 MED ORDER — PROMETHAZINE HCL 25 MG/ML IJ SOLN
50.0000 mg | Freq: Once | INTRAMUSCULAR | Status: AC
  Administered 2012-07-01: 50 mg via INTRAMUSCULAR

## 2012-07-01 MED ORDER — CITALOPRAM HYDROBROMIDE 20 MG PO TABS: 20.0000 mg | ORAL_TABLET | Freq: Every day | ORAL | Status: DC

## 2012-07-01 MED ORDER — KETOROLAC TROMETHAMINE 60 MG/2ML IM SOLN
60.0000 mg | Freq: Once | INTRAMUSCULAR | Status: AC
  Administered 2012-07-01: 60 mg via INTRAMUSCULAR

## 2012-07-01 MED ORDER — LISINOPRIL 40 MG PO TABS: 40.0000 mg | ORAL_TABLET | Freq: Every day | ORAL | Status: DC

## 2012-07-01 MED ORDER — ALPRAZOLAM 1 MG PO TABS: 1.0000 mg | ORAL_TABLET | Freq: Two times a day (BID) | ORAL | Status: DC

## 2012-07-01 MED ORDER — METOPROLOL SUCCINATE ER 50 MG PO TB24: 50.0000 mg | ORAL_TABLET | Freq: Every day | ORAL | Status: DC

## 2012-07-01 NOTE — Progress Notes (Signed)
Reviewed and agree.

## 2012-07-01 NOTE — Patient Instructions (Signed)
Migraine Headache A migraine headache is an intense, throbbing pain on one or both sides of your head. A migraine can last for 30 minutes to several hours. CAUSES  The exact cause of a migraine headache is not always known. However, a migraine may be caused when nerves in the brain become irritated and release chemicals that cause inflammation. This causes pain. SYMPTOMS  Pain on one or both sides of your head.  Pulsating or throbbing pain.  Severe pain that prevents daily activities.  Pain that is aggravated by any physical activity.  Nausea, vomiting, or both.  Dizziness.  Pain with exposure to bright lights, loud noises, or activity.  General sensitivity to bright lights, loud noises, or smells. Before you get a migraine, you may get warning signs that a migraine is coming (aura). An aura may include:  Seeing flashing lights.  Seeing bright spots, halos, or zig-zag lines.  Having tunnel vision or blurred vision.  Having feelings of numbness or tingling.  Having trouble talking.  Having muscle weakness. MIGRAINE TRIGGERS  Alcohol.  Smoking.  Stress.  Menstruation.  Aged cheeses.  Foods or drinks that contain nitrates, glutamate, aspartame, or tyramine.  Lack of sleep.  Chocolate.  Caffeine.  Hunger.  Physical exertion.  Fatigue.  Medicines used to treat chest pain (nitroglycerine), birth control pills, estrogen, and some blood pressure medicines. DIAGNOSIS  A migraine headache is often diagnosed based on:  Symptoms.  Physical examination.  A CT scan or MRI of your head. TREATMENT Medicines may be given for pain and nausea. Medicines can also be given to help prevent recurrent migraines.  HOME CARE INSTRUCTIONS  Only take over-the-counter or prescription medicines for pain or discomfort as directed by your caregiver. The use of long-term narcotics is not recommended.  Lie down in a dark, quiet room when you have a migraine.  Keep a journal  to find out what may trigger your migraine headaches. For example, write down:  What you eat and drink.  How much sleep you get.  Any change to your diet or medicines.  Limit alcohol consumption.  Quit smoking if you smoke.  Get 7 to 9 hours of sleep, or as recommended by your caregiver.  Limit stress.  Keep lights dim if bright lights bother you and make your migraines worse. SEEK IMMEDIATE MEDICAL CARE IF:   Your migraine becomes severe.  You have a fever.  You have a stiff neck.  You have vision loss.  You have muscular weakness or loss of muscle control.  You start losing your balance or have trouble walking.  You feel faint or pass out.  You have severe symptoms that are different from your first symptoms. MAKE SURE YOU:   Understand these instructions.  Will watch your condition.  Will get help right away if you are not doing well or get worse. Document Released: 06/11/2005 Document Revised: 09/03/2011 Document Reviewed: 06/01/2011 [employer] Patient Information 2013 [employer].  

## 2012-07-01 NOTE — Progress Notes (Signed)
[facility] [address], [place] [address] [phone]  Date:  07/01/2012   Name:  Brenda Rice   DOB:  12-20-1955   MRN:  [id number]  PCP:  No primary provider on file.    Chief Complaint: Migraine and Medication Refill   History of Present Illness:  Brenda Rice is a 57 y.o. very pleasant female patient who presents with the following:  Severe headache onset yesterday associated with photophobia and yesterday blurred vision to the point that she could not drive here safely.  History of migraine headaches in past.  Last about 6 months ago.  Says she checks her blood pressure daily and it elevates with stress level.  Friday BP was 130/80.  Denies any neurologic or other visual symptoms. No chest pain or shortness of breath.  Smokes 2 ppd.  No plans to quite.  Patient Active Problem List  Diagnosis  . Respiratory failure  . Overdose of benzodiazepine  . Benzodiazepine overdose  . Adjustment disorder with mixed disturbance of emotions and conduct  . Hypertension  . Nicotine addiction    Past Medical History  Diagnosis Date  . Hypertension   . Depression   . Suicide attempt     x 3  . Osteoarthritis     right hip  . Fx clavicle     Hx of left clavicle fx  . Hx: UTI (urinary tract infection)     Past Surgical History  Procedure Date  . Orif left clavicle fracture and pinning 2nd metacarpal 01/03/2010    History  Substance Use Topics  . Smoking status: Current Every Day Smoker -- 2.0 packs/day for 40 years    Types: Cigarettes  . Smokeless tobacco: Never Used  . Alcohol Use: 2.4 oz/week    3 Glasses of wine, 1 Cans of beer per week    No family history on file.  Allergies  Allergen Reactions  . No Known Allergies     Medication list has been reviewed and updated.  Current Outpatient Prescriptions on File Prior to Visit  Medication Sig Dispense Refill  . ALPRAZolam (XANAX) 1 MG tablet Take 1 mg by mouth 2 (two) times daily.      Marland Kitchen  aspirin EC 81 MG tablet Take 81 mg by mouth daily.      . citalopram (CELEXA) 20 MG tablet Take 1 tablet (20 mg total) by mouth daily.  30 tablet  0  . lisinopril (PRINIVIL,ZESTRIL) 40 MG tablet Take 1 tablet (40 mg total) by mouth daily.  30 tablet  0  . carbamazepine (TEGRETOL XR) 200 MG 12 hr tablet Take 3 tablets (600 mg total) by mouth daily.  30 tablet  0    Review of Systems:  As per HPI, otherwise negative.    Physical Examination: Filed Vitals:   07/01/12 1215  BP: 180/110  Pulse: 127  Temp: 98.3 F (36.8 C)  Resp: 20   Filed Vitals:   07/01/12 1215  Height: 5' 2.5" (1.588 m)  Weight: 154 lb 9.6 oz (70.126 kg)   Body mass index is 27.83 kg/(m^2). Ideal Body Weight: Weight in (lb) to have BMI = 25: 138.6   GEN: WDWN, NAD, Non-toxic, A & O x 3  Strong cigarette odor. HEENT: Atraumatic, Normocephalic. Neck supple. No masses, No LAD.  PRRERLA EOMI fundi benign.  CN 2-12 intact Ears and Nose: No external deformity.  TM negative on right left obstructed by cerumen CV: RRR, No M/G/R. No  JVD. No thrill. No extra heart sounds. PULM: CTA B, no wheezes, crackles, rhonchi. No retractions. No resp. distress. No accessory muscle use. ABD: S, NT, ND, +BS. No rebound. No HSM. EXTR: No c/c/e NEURO Normal gait.  PSYCH: Normally interactive. Conversant. Not depressed or anxious appearing.  Calm demeanor.    Assessment and Plan: Migraine headache Hypertension toradol Phenergan Metoprolol Follow up in one week with Dr Gwynne Edinger, Tessa Lerner, MD

## 2012-07-04 ENCOUNTER — Telehealth: Payer: Self-pay

## 2012-07-04 MED ORDER — PROMETHAZINE HCL 12.5 MG PO TABS: 12.5000 mg | ORAL_TABLET | Freq: Three times a day (TID) | ORAL | Status: DC | PRN

## 2012-07-04 NOTE — Telephone Encounter (Signed)
Patient needs more nausea medication for migraine, she is not feeling better.  Best [phone]

## 2012-07-04 NOTE — Telephone Encounter (Signed)
I have gotten a call from upstairs/ phone room patient has called 4 times since this message taken, If she can not wait for a response to this, or if she is getting worse,she should come in for these problems. She states she will await the phone call, and states she is just anxious for a return call. I have advised her I will call back as soon as I can.

## 2012-07-04 NOTE — Telephone Encounter (Signed)
I have spoken to Brenda Rice also about work note okay to give note out 1/7 until 1/10 if she is is not able to return after today she is to return to clinic. Also if she is getting worse she is to return to clinic. I have left message for her to advise of this.

## 2012-07-04 NOTE — Telephone Encounter (Signed)
Please advise, patient requests work note for this week. She has been out of work all week. She also is requesting meds for Nausea. Her headache persists. Please advise.

## 2012-07-04 NOTE — Telephone Encounter (Signed)
LMOM to CB. 

## 2012-07-04 NOTE — Telephone Encounter (Signed)
I have sent phenergan to the pharmacy.  What is she using for headache relief?  Is the headache worsening?  If she feels like she is worsening or not improving she needs to RTC

## 2012-07-05 ENCOUNTER — Emergency Department (HOSPITAL_COMMUNITY): Payer: Managed Care, Other (non HMO)

## 2012-07-05 ENCOUNTER — Inpatient Hospital Stay (HOSPITAL_COMMUNITY)
Admission: EM | Admit: 2012-07-05 | Discharge: 2012-07-08 | DRG: 917 | Disposition: A | Discharge status: Home / Self Care | Payer: Managed Care, Other (non HMO) | Attending: Internal Medicine | Admitting: Internal Medicine

## 2012-07-05 ENCOUNTER — Observation Stay (HOSPITAL_COMMUNITY): Payer: Managed Care, Other (non HMO)

## 2012-07-05 DIAGNOSIS — T43502A Poisoning by unspecified antipsychotics and neuroleptics, intentional self-harm, initial encounter: Secondary | ICD-10-CM | POA: Diagnosis present

## 2012-07-05 DIAGNOSIS — F332 Major depressive disorder, recurrent severe without psychotic features: Secondary | ICD-10-CM | POA: Diagnosis present

## 2012-07-05 DIAGNOSIS — J9690 Respiratory failure, unspecified, unspecified whether with hypoxia or hypercapnia: Secondary | ICD-10-CM

## 2012-07-05 DIAGNOSIS — J42 Unspecified chronic bronchitis: Secondary | ICD-10-CM | POA: Diagnosis present

## 2012-07-05 DIAGNOSIS — I10 Essential (primary) hypertension: Secondary | ICD-10-CM | POA: Diagnosis present

## 2012-07-05 DIAGNOSIS — M1610 Unilateral primary osteoarthritis, unspecified hip: Secondary | ICD-10-CM | POA: Diagnosis present

## 2012-07-05 DIAGNOSIS — Z79899 Other long term (current) drug therapy: Secondary | ICD-10-CM

## 2012-07-05 DIAGNOSIS — J209 Acute bronchitis, unspecified: Secondary | ICD-10-CM | POA: Diagnosis present

## 2012-07-05 DIAGNOSIS — Z23 Encounter for immunization: Secondary | ICD-10-CM

## 2012-07-05 DIAGNOSIS — F607 Dependent personality disorder: Secondary | ICD-10-CM | POA: Diagnosis present

## 2012-07-05 DIAGNOSIS — G929 Unspecified toxic encephalopathy: Secondary | ICD-10-CM | POA: Diagnosis present

## 2012-07-05 DIAGNOSIS — F10929 Alcohol use, unspecified with intoxication, unspecified: Secondary | ICD-10-CM

## 2012-07-05 DIAGNOSIS — F10229 Alcohol dependence with intoxication, unspecified: Secondary | ICD-10-CM | POA: Diagnosis present

## 2012-07-05 DIAGNOSIS — M169 Osteoarthritis of hip, unspecified: Secondary | ICD-10-CM | POA: Diagnosis present

## 2012-07-05 DIAGNOSIS — E871 Hypo-osmolality and hyponatremia: Secondary | ICD-10-CM | POA: Diagnosis present

## 2012-07-05 DIAGNOSIS — B954 Other streptococcus as the cause of diseases classified elsewhere: Secondary | ICD-10-CM | POA: Diagnosis present

## 2012-07-05 DIAGNOSIS — T50902A Poisoning by unspecified drugs, medicaments and biological substances, intentional self-harm, initial encounter: Secondary | ICD-10-CM

## 2012-07-05 DIAGNOSIS — F17200 Nicotine dependence, unspecified, uncomplicated: Secondary | ICD-10-CM | POA: Diagnosis present

## 2012-07-05 DIAGNOSIS — Z87440 Personal history of urinary (tract) infections: Secondary | ICD-10-CM

## 2012-07-05 DIAGNOSIS — R82998 Other abnormal findings in urine: Secondary | ICD-10-CM | POA: Diagnosis present

## 2012-07-05 DIAGNOSIS — Y92009 Unspecified place in unspecified non-institutional (private) residence as the place of occurrence of the external cause: Secondary | ICD-10-CM

## 2012-07-05 DIAGNOSIS — T424X4A Poisoning by benzodiazepines, undetermined, initial encounter: Principal | ICD-10-CM | POA: Diagnosis present

## 2012-07-05 DIAGNOSIS — F4325 Adjustment disorder with mixed disturbance of emotions and conduct: Secondary | ICD-10-CM | POA: Diagnosis present

## 2012-07-05 DIAGNOSIS — R0789 Other chest pain: Secondary | ICD-10-CM | POA: Diagnosis present

## 2012-07-05 DIAGNOSIS — G92 Toxic encephalopathy: Secondary | ICD-10-CM | POA: Diagnosis present

## 2012-07-05 DIAGNOSIS — T424X1A Poisoning by benzodiazepines, accidental (unintentional), initial encounter: Secondary | ICD-10-CM | POA: Diagnosis present

## 2012-07-05 DIAGNOSIS — T50904A Poisoning by unspecified drugs, medicaments and biological substances, undetermined, initial encounter: Secondary | ICD-10-CM

## 2012-07-05 DIAGNOSIS — F329 Major depressive disorder, single episode, unspecified: Secondary | ICD-10-CM

## 2012-07-05 DIAGNOSIS — T50901A Poisoning by unspecified drugs, medicaments and biological substances, accidental (unintentional), initial encounter: Secondary | ICD-10-CM

## 2012-07-05 DIAGNOSIS — G9389 Other specified disorders of brain: Secondary | ICD-10-CM | POA: Diagnosis present

## 2012-07-05 DIAGNOSIS — E875 Hyperkalemia: Secondary | ICD-10-CM | POA: Diagnosis present

## 2012-07-05 DIAGNOSIS — E876 Hypokalemia: Secondary | ICD-10-CM | POA: Diagnosis not present

## 2012-07-05 DIAGNOSIS — Z7982 Long term (current) use of aspirin: Secondary | ICD-10-CM

## 2012-07-05 LAB — RAPID URINE DRUG SCREEN, HOSP PERFORMED
Benzodiazepines: POSITIVE — AB
Cocaine: NOT DETECTED
Opiates: NOT DETECTED
Tetrahydrocannabinol: NOT DETECTED

## 2012-07-05 LAB — COMPREHENSIVE METABOLIC PANEL
AST: 39 U/L — ABNORMAL HIGH (ref 0–37)
Albumin: 4.2 g/dL (ref 3.5–5.2)
Chloride: 87 mEq/L — ABNORMAL LOW (ref 96–112)
Creatinine, Ser: 0.48 mg/dL — ABNORMAL LOW (ref 0.50–1.10)
Potassium: 5.3 mEq/L — ABNORMAL HIGH (ref 3.5–5.1)
Total Bilirubin: 0.3 mg/dL (ref 0.3–1.2)

## 2012-07-05 LAB — CBC WITH DIFFERENTIAL/PLATELET
Basophils Relative: 0 % (ref 0–1)
Eosinophils Absolute: 0.1 10*3/uL (ref 0.0–0.7)
HCT: 43.2 % (ref 36.0–46.0)
Hemoglobin: 15.6 g/dL — ABNORMAL HIGH (ref 12.0–15.0)
MCH: 34.6 pg — ABNORMAL HIGH (ref 26.0–34.0)
MCHC: 36.1 g/dL — ABNORMAL HIGH (ref 30.0–36.0)
Monocytes Absolute: 0.6 10*3/uL (ref 0.1–1.0)
Monocytes Relative: 7 % (ref 3–12)

## 2012-07-05 LAB — ACETAMINOPHEN LEVEL: Acetaminophen (Tylenol), Serum: <15 ug/mL (ref 10–30)

## 2012-07-05 LAB — URINALYSIS, ROUTINE W REFLEX MICROSCOPIC
Bilirubin Urine: NEGATIVE
Hgb urine dipstick: NEGATIVE
Ketones, ur: NEGATIVE mg/dL
Protein, ur: NEGATIVE mg/dL
Urobilinogen, UA: 0.2 mg/dL (ref 0.0–1.0)

## 2012-07-05 LAB — SALICYLATE LEVEL: Salicylate Lvl: <2 mg/dL — ABNORMAL LOW (ref 2.8–20.0)

## 2012-07-05 MED ORDER — SODIUM CHLORIDE 0.9 % IJ SOLN
3.0000 mL | Freq: Two times a day (BID) | INTRAMUSCULAR | Status: DC
  Administered 2012-07-06: 3 mL via INTRAVENOUS

## 2012-07-05 MED ORDER — ONDANSETRON HCL 4 MG/2ML IJ SOLN: 4.0000 mg | Freq: Four times a day (QID) | INTRAMUSCULAR | Status: DC | PRN

## 2012-07-05 MED ORDER — LIDOCAINE HCL (CARDIAC) 20 MG/ML IV SOLN
INTRAVENOUS | Status: AC
  Filled 2012-07-05: qty 5

## 2012-07-05 MED ORDER — ETOMIDATE 2 MG/ML IV SOLN
INTRAVENOUS | Status: AC
  Filled 2012-07-05: qty 20

## 2012-07-05 MED ORDER — ONDANSETRON HCL 4 MG PO TABS: 4.0000 mg | ORAL_TABLET | Freq: Four times a day (QID) | ORAL | Status: DC | PRN

## 2012-07-05 MED ORDER — SUCCINYLCHOLINE CHLORIDE 20 MG/ML IJ SOLN
INTRAMUSCULAR | Status: AC
  Filled 2012-07-05: qty 5

## 2012-07-05 MED ORDER — SODIUM CHLORIDE 0.9 % IV SOLN: INTRAVENOUS | Status: DC

## 2012-07-05 MED ORDER — THIAMINE HCL 100 MG/ML IJ SOLN
Freq: Once | INTRAVENOUS | Status: AC
  Administered 2012-07-05: 23:00:00 via INTRAVENOUS
  Filled 2012-07-05: qty 1000

## 2012-07-05 MED ORDER — ACETAMINOPHEN 650 MG RE SUPP: 650.0000 mg | Freq: Four times a day (QID) | RECTAL | Status: DC | PRN

## 2012-07-05 MED ORDER — CARBAMAZEPINE ER 400 MG PO TB12
600.0000 mg | ORAL_TABLET | Freq: Every day | ORAL | Status: DC
  Administered 2012-07-06 – 2012-07-08 (×3): 600 mg via ORAL
  Filled 2012-07-05 (×3): qty 1

## 2012-07-05 MED ORDER — ACETAMINOPHEN 325 MG PO TABS
650.0000 mg | ORAL_TABLET | Freq: Four times a day (QID) | ORAL | Status: DC | PRN
  Administered 2012-07-06 – 2012-07-08 (×4): 650 mg via ORAL
  Filled 2012-07-05 (×4): qty 2

## 2012-07-05 MED ORDER — CITALOPRAM HYDROBROMIDE 20 MG PO TABS
20.0000 mg | ORAL_TABLET | Freq: Every day | ORAL | Status: DC
  Administered 2012-07-06 – 2012-07-08 (×3): 20 mg via ORAL
  Filled 2012-07-05 (×3): qty 1

## 2012-07-05 MED ORDER — ALPRAZOLAM 1 MG PO TABS
1.0000 mg | ORAL_TABLET | Freq: Two times a day (BID) | ORAL | Status: DC
Start: 2012-07-05 — End: 2012-07-08
  Administered 2012-07-06 – 2012-07-08 (×5): 1 mg via ORAL
  Filled 2012-07-05 (×5): qty 1

## 2012-07-05 MED ORDER — ENOXAPARIN SODIUM 40 MG/0.4ML ~~LOC~~ SOLN
40.0000 mg | Freq: Every day | SUBCUTANEOUS | Status: DC
  Administered 2012-07-05 – 2012-07-06 (×2): 40 mg via SUBCUTANEOUS
  Filled 2012-07-05 (×4): qty 0.4

## 2012-07-05 MED ORDER — ASPIRIN EC 81 MG PO TBEC
81.0000 mg | DELAYED_RELEASE_TABLET | Freq: Every day | ORAL | Status: DC
  Administered 2012-07-06 – 2012-07-08 (×3): 81 mg via ORAL
  Filled 2012-07-05 (×3): qty 1

## 2012-07-05 MED ORDER — ROCURONIUM BROMIDE 50 MG/5ML IV SOLN
INTRAVENOUS | Status: AC
  Filled 2012-07-05: qty 2

## 2012-07-05 NOTE — H&P (Signed)
History and Physical  Brenda Rice ZOX:[id number] DOB: 11-20-1955 DOA: 07/05/2012  Referring physician: Dr Denton Lank PCP: No primary provider on file.   Chief Complaint: Altered mental status due to Medication overdose  HPI: Patient is 58 year old female with PMH of suicide attempt and detox for substance abuse who was brought in by EMS after being found lethargic. Per EMS and family report, Pt states she told family she was "drinking beer and wine all day" then took "6 xanax" approx 1.5 to 2 hours PTA. Family states they "know she's depressed" but that pt had not been voicing SI to them recently. EMS noted pt to be lethargic but arousable and combative at times, placed nasal trumpet, suctioned mouth en route with strong gag.  Patient is a provider history although she is groggy. Patient says that she does not know why she started drinking today but she denies any suicidal ideation. Patient complained of chest pain present in the upper left side of her chest since last 2 weeks. 4-5/10 at its worse. Worse with deep breathing and improved with blood pressure medications. Pain is not associated with exertion, eating. No palpitations noted. Patient is able to walk without getting short of breath or having any chest pain.  Patient is able to maintain her airway but her electrolytes revealed serum sodium of 122. ER physician asked me to admit the patient for observation as patient is still groggy.   Review of Systems:  Positive for chest pain, generalized weakness, substance abuse. Negative for headaches, ear pain, blurred vision, difficulty swallowing, shortness of breath, abdominal pain, change in urinary habits, change in bowel movements, weakness in any particular limb.  Past Medical History  Diagnosis Date  . Hypertension   . Depression   . Suicide attempt     x 3  . Osteoarthritis     right hip  . Fx clavicle     Hx of left clavicle fx  . Hx: UTI (urinary tract infection)     Past Surgical  History  Procedure Date  . Orif left clavicle fracture and pinning 2nd metacarpal 01/03/2010    Social History:  reports that she has been smoking Cigarettes.  She has a 80 pack-year smoking history. She has never used smokeless tobacco. She reports that she drinks about 2.4 ounces of alcohol per week. She reports that she does not use illicit drugs.  Allergies  Allergen Reactions  . No Known Allergies     No family history on file.   Prior to Admission medications   Medication Sig Start Date End Date Taking? Authorizing Provider  ALPRAZolam Prudy Feeler) 1 MG tablet Take 1 tablet (1 mg total) by mouth 2 (two) times daily. 07/01/12   Phillips Odor, MD  aspirin EC 81 MG tablet Take 81 mg by mouth daily.    Historical Provider, MD  carbamazepine (TEGRETOL XR) 200 MG 12 hr tablet Take 3 tablets (600 mg total) by mouth daily. 07/12/11 07/11/12  Mike Craze, MD  citalopram (CELEXA) 20 MG tablet Take 1 tablet (20 mg total) by mouth daily. 07/01/12 07/01/13  Phillips Odor, MD  lisinopril (PRINIVIL,ZESTRIL) 40 MG tablet Take 1 tablet (40 mg total) by mouth daily. 07/01/12   Phillips Odor, MD  metoprolol succinate (TOPROL-XL) 50 MG 24 hr tablet Take 1 tablet (50 mg total) by mouth daily. Take with or immediately following a meal. 07/01/12   Phillips Odor, MD  promethazine (PHENERGAN) 12.5 MG tablet Take 1 tablet (12.5 mg total) by mouth  every 8 (eight) hours as needed for nausea. 07/04/12   Godfrey Pick, PA-C   Physical Exam: Filed Vitals:   07/05/12 1439 07/05/12 1800 07/05/12 1839 07/05/12 1900  BP: 141/86 118/74 133/79 140/78  Pulse:   84 85  Temp:   96.2 F (35.7 C)   TempSrc:   Oral   Resp:  16 18 16   SpO2:   100% 99%   Physical Exam: General: Vital signs reviewed and noted. Well-developed, well-nourished, in no acute distress; alert, appropriate and cooperative throughout examination.  Head: Normocephalic, atraumatic.  Eyes: PERRL, EOMI, No signs of anemia or jaundince.  Nose:  Mucous membranes moist, not inflammed, nonerythematous.  Throat: Oropharynx nonerythematous, no exudate appreciated.   Neck: No deformities, masses, or tenderness noted.Supple, No carotid Bruits, no JVD.  Lungs:  Normal respiratory effort. Clear to auscultation BL without crackles or wheezes.  Heart: RRR. S1 and S2 normal without gallop, murmur, or rubs.  Abdomen:  BS normoactive. Soft, Nondistended, non-tender.  No masses or organomegaly.  Extremities: No pretibial edema.  Neurologic: A&O X3, CN II - XII are grossly intact. Motor strength is 5/5 in the all 4 extremities, Sensations intact to light touch, Cerebellar signs negative.  Skin: No visible rashes, scars.    Wt Readings from Last 3 Encounters:  07/01/12 154 lb 9.6 oz (70.126 kg)  09/02/11 147 lb (66.679 kg)  07/05/11 148 lb (67.132 kg)    Labs on Admission:  Basic Metabolic Panel:  Lab 07/05/12 5409  NA 122*  K 5.3*  CL 87*  CO2 18*  GLUCOSE 88  BUN 4*  CREATININE 0.48*  CALCIUM 9.1  MG --  PHOS --    Liver Function Tests:  Lab 07/05/12 1516  AST 39*  ALT 39*  ALKPHOS 87  BILITOT 0.3  PROT 7.8  ALBUMIN 4.2    Lab 07/05/12 1516  LIPASE 22  AMYLASE --    CBC:  Lab 07/05/12 1516  WBC 7.8  NEUTROABS 4.4  HGB 15.6*  HCT 43.2  MCV 95.8  PLT 141*    Radiological Exams on Admission: Dg Chest Port 1 View  07/05/2012  *RADIOLOGY REPORT*  Clinical Data: Overdose.  Unresponsive.  PORTABLE CHEST - 1 VIEW  Comparison: 09/02/2011.  Findings: The cardiac silhouette, mediastinal and hilar contours are within normal limits.  Low lung volumes with vascular crowding and atelectasis.  Increased interstitial markings could reflect bronchitis or interstitial edema.  No pleural effusion.  Remote healed rib fractures are noted.  IMPRESSION:  1.  Low lung volumes with vascular crowding and atelectasis. 2.  Increased interstitial markings could be due to bronchitis or interstitial edema.   Original Report Authenticated By:  Rudie Meyer, M.D.     EKG: Independently reviewed. 87 beats per minutes. The EKG needs to be repeated.   Principal Problem:  *Overdose of benzodiazepine Active Problems:  Adjustment disorder with mixed disturbance of emotions and conduct  Hypertension  Nicotine addiction  Hyponatremia  Atypical chest pain   Assessment/Plan   Respiratory depression secondary to benzo overdose now resolved. Patient has improved since arrival 4 hours ago and able to protect her airway without signs of aspiration  Plan:  -Observe overnight   Bezo overdose/encephalopathy- Drinking wine and beer all day and then took 6 tabs of 1mg  Xanax, reported as chronic med  Plan:  -IVF  -NPO for now as patient is still not back to baseline mental status -watch for withdrawal (CIWA) -hemodynamic support/montioring  -Follow poison control recommendations  -  will hold off from reversal of benzos (flumazenil) to limit risk of seizure w/d   Atypical chest pain Likely musculoskeletal as chronic and worse with breathing(atypical for cardiac) CXR suggests bronchitis vs interstitial edema Tylenol as needed 2 view CXR when able  Suicide precautions  Plan:  -Place in suicide precautions  -Psych eval in AM  Hypertension I will hold antihypertensives at this time  Depression Continue home citalopram and Xanax  Hyponatremia -Likely due to excess beer/wine intake. Was present on previous admission which resolved spontaneously. -Monitor BMP   Code Status: Full code Family Communication: None present at bedside Disposition Plan/Anticipated LOS: home when stable(or as recommended by psych)  Time spent: 70 minutes  Lars Mage, MD  [facility] Team 5  If 7PM-7AM, please contact night-coverage at [email or web address], password [facility] 07/05/2012, 7:22 PM

## 2012-07-05 NOTE — ED Notes (Signed)
Nasal airway placed by ems

## 2012-07-05 NOTE — ED Notes (Signed)
No change with status. Patient sleeping. Airway is patent (still has nasal trumpet to right nostril) Pulse ox 98%.  Cardiac monitor NSR without ectopics. Will continue to monitor 1:1 due to present history of xanax & alcohol.

## 2012-07-05 NOTE — ED Provider Notes (Signed)
History     CSN: [id number]  Arrival date & time 07/05/12  1424   First MD Initiated Contact with Patient 07/05/12 1430      Chief Complaint  Patient presents with  . Drug Overdose  . Altered Mental Status     Patient is a 57 y.o. female presenting with Overdose and altered mental status. The history is provided by the EMS personnel, a relative and the spouse. The history is limited by the condition of the patient (AMS, lethargy).  Drug Overdose  Altered Mental Status  Pt was seen at 1420.  Per EMS and family report:  Pt states she told family she was "drinking beer and wine all day" then took "6 xanax" approx 1.5 to 2 hours PTA.  Pt has hx of OD on xanax last year at this time.  Family states they "know she's depressed" but that pt had not been voicing SI to them recently.  EMS noted pt to be lethargic but arousable and combative at times, placed nasal trumpet, suctioned mouth en route with strong gag.    Past Medical History  Diagnosis Date  . Hypertension   . Depression   . Suicide attempt     x 3  . Osteoarthritis     right hip  . Fx clavicle     Hx of left clavicle fx  . Hx: UTI (urinary tract infection)     Past Surgical History  Procedure Date  . Orif left clavicle fracture and pinning 2nd metacarpal 01/03/2010     History  Substance Use Topics  . Smoking status: Current Every Day Smoker -- 2.0 packs/day for 40 years    Types: Cigarettes  . Smokeless tobacco: Never Used  . Alcohol Use: 2.4 oz/week    3 Glasses of wine, 1 Cans of beer per week    Review of Systems  Unable to perform ROS: Mental status change  Psychiatric/Behavioral: Positive for altered mental status.    Allergies  No known allergies  Home Medications   Current Outpatient Rx  Name  Route  Sig  Dispense  Refill  . ALPRAZOLAM 1 MG PO TABS   Oral   Take 1 tablet (1 mg total) by mouth 2 (two) times daily.   60 tablet   2   . ASPIRIN EC 81 MG PO TBEC   Oral   Take 81 mg by mouth  daily.         Marland Kitchen CARBAMAZEPINE ER 200 MG PO TB12   Oral   Take 3 tablets (600 mg total) by mouth daily.   30 tablet   0   . CITALOPRAM HYDROBROMIDE 20 MG PO TABS   Oral   Take 1 tablet (20 mg total) by mouth daily.   30 tablet   5   . LISINOPRIL 40 MG PO TABS   Oral   Take 1 tablet (40 mg total) by mouth daily.   30 tablet   5     Needs office visit for more refills   . METOPROLOL SUCCINATE ER 50 MG PO TB24   Oral   Take 1 tablet (50 mg total) by mouth daily. Take with or immediately following a meal.   30 tablet   1   . PROMETHAZINE HCL 12.5 MG PO TABS   Oral   Take 1 tablet (12.5 mg total) by mouth every 8 (eight) hours as needed for nausea.   20 tablet   0     BP 141/86  Pulse 88  Temp 97 F (36.1 C) (Rectal)  Resp 27  SpO2 90%  Physical Exam 1425: Physical examination:  Nursing notes reviewed; Vital signs and O2 SAT reviewed;  Constitutional: Well developed, Well nourished, Well hydrated, In no acute distress; Head:  Normocephalic, atraumatic; Eyes: EOMI, PERRL, No scleral icterus; ENMT: Mouth and pharynx normal, Mucous membranes moist; Neck: Supple, Full range of motion, No lymphadenopathy; Cardiovascular: Regular rate and rhythm, No gallop; Respiratory: Breath sounds clear & equal bilaterally, No rales, rhonchi, wheezes.  Speaking full sentences with ease, Normal respiratory effort/excursion; Chest: Nontender, Movement normal; Abdomen: Soft, Nontender, Nondistended, Normal bowel sounds; Genitourinary: No CVA tenderness; Extremities: Pulses normal, No tenderness, No edema, No calf edema or asymmetry.; Neuro: Lethargic, but arousable to name. Major CN grossly intact.  Speech slurred. Moves all ext on stretcher spontaneously and to command without apparent gross focal motor deficits.; Skin: Color normal, Warm, Dry.   ED Course  Procedures     MDM  MDM Reviewed: nursing note, previous chart and vitals Reviewed previous: labs and ECG Interpretation: labs,  x-ray and ECG      Date: 07/05/2012  Rate: 91  Rhythm: normal sinus rhythm  QRS Axis: normal  Intervals: normal  ST/T Wave abnormalities: nonspecific ST/T changes  Conduction Disutrbances:none  Narrative Interpretation:   Old EKG Reviewed: unchanged; no significant changes from previous EKG dated 07/09/2011.  Results for orders placed during the hospital encounter of 07/05/12  ACETAMINOPHEN LEVEL      Component Value Range   Acetaminophen (Tylenol), Serum <15.0  10 - 30 ug/mL  SALICYLATE LEVEL      Component Value Range   Salicylate Lvl <2.0 (*) 2.8 - 20.0 mg/dL  ETHANOL      Component Value Range   Alcohol, Ethyl (B) 272 (*) 0 - 11 mg/dL  URINE RAPID DRUG SCREEN (HOSP PERFORMED)      Component Value Range   Opiates NONE DETECTED  NONE DETECTED   Cocaine NONE DETECTED  NONE DETECTED   Benzodiazepines POSITIVE (*) NONE DETECTED   Amphetamines NONE DETECTED  NONE DETECTED   Tetrahydrocannabinol NONE DETECTED  NONE DETECTED   Barbiturates NONE DETECTED  NONE DETECTED  CBC WITH DIFFERENTIAL      Component Value Range   WBC 7.8  4.0 - 10.5 K/uL   RBC 4.51  3.87 - 5.11 MIL/uL   Hemoglobin 15.6 (*) 12.0 - 15.0 g/dL   HCT 82.9  56.2 - 13.0 %   MCV 95.8  78.0 - 100.0 fL   MCH 34.6 (*) 26.0 - 34.0 pg   MCHC 36.1 (*) 30.0 - 36.0 g/dL   RDW 86.5  78.4 - 69.6 %   Platelets 141 (*) 150 - 400 K/uL   Neutrophils Relative 57  43 - 77 %   Neutro Abs 4.4  1.7 - 7.7 K/uL   Lymphocytes Relative 35  12 - 46 %   Lymphs Abs 2.7  0.7 - 4.0 K/uL   Monocytes Relative 7  3 - 12 %   Monocytes Absolute 0.6  0.1 - 1.0 K/uL   Eosinophils Relative 1  0 - 5 %   Eosinophils Absolute 0.1  0.0 - 0.7 K/uL   Basophils Relative 0  0 - 1 %   Basophils Absolute 0.0  0.0 - 0.1 K/uL  URINALYSIS, ROUTINE W REFLEX MICROSCOPIC      Component Value Range   Color, Urine YELLOW  YELLOW   APPearance CLEAR  CLEAR   Specific Gravity, Urine 1.006  1.005 - 1.030   pH 6.0  5.0 - 8.0   Glucose, UA NEGATIVE   NEGATIVE mg/dL   Hgb urine dipstick NEGATIVE  NEGATIVE   Bilirubin Urine NEGATIVE  NEGATIVE   Ketones, ur NEGATIVE  NEGATIVE mg/dL   Protein, ur NEGATIVE  NEGATIVE mg/dL   Urobilinogen, UA 0.2  0.0 - 1.0 mg/dL   Nitrite NEGATIVE  NEGATIVE   Leukocytes, UA NEGATIVE  NEGATIVE  COMPREHENSIVE METABOLIC PANEL      Component Value Range   Sodium 122 (*) 135 - 145 mEq/L   Potassium 5.3 (*) 3.5 - 5.1 mEq/L   Chloride 87 (*) 96 - 112 mEq/L   CO2 18 (*) 19 - 32 mEq/L   Glucose, Bld 88  70 - 99 mg/dL   BUN 4 (*) 6 - 23 mg/dL   Creatinine, Ser 1.61 (*) 0.50 - 1.10 mg/dL   Calcium 9.1  8.4 - 09.6 mg/dL   Total Protein 7.8  6.0 - 8.3 g/dL   Albumin 4.2  3.5 - 5.2 g/dL   AST 39 (*) 0 - 37 U/L   ALT 39 (*) 0 - 35 U/L   Alkaline Phosphatase 87  39 - 117 U/L   Total Bilirubin 0.3  0.3 - 1.2 mg/dL   GFR calc non Af Amer >90  >90 mL/min   GFR calc Af Amer >90  >90 mL/min  LIPASE, BLOOD      Component Value Range   Lipase 22  11 - 59 U/L   Dg Chest Port 1 View 07/05/2012  *RADIOLOGY REPORT*  Clinical Data: Overdose.  Unresponsive.  PORTABLE CHEST - 1 VIEW  Comparison: 09/02/2011.  Findings: The cardiac silhouette, mediastinal and hilar contours are within normal limits.  Low lung volumes with vascular crowding and atelectasis.  Increased interstitial markings could reflect bronchitis or interstitial edema.  No pleural effusion.  Remote healed rib fractures are noted.  IMPRESSION:  1.  Low lung volumes with vascular crowding and atelectasis. 2.  Increased interstitial markings could be due to bronchitis or interstitial edema.   Original Report Authenticated By: Rudie Meyer, M.D.     1630:  Family states pt has hx of same (OD on xanax) last January 2013.  At that time she was admitted to the ICU after being intubated in the ED.  Family states pt "never followed up" with mental health after being discharged.  Pt's SBP remains stable 140's, HR 80's.  Sats 94-96% on O2 N/C.  Continues lethargic, but easily  arousable to name and maintaining her own airway:  Will open her eyes, push examiner's hands away and briefly answer questions with slurred speech.  Will need continued ED observation until more awake for medical admit vs psych eval.  Sign out to EDP Dr. Denton Lank.         Laray Anger, DO 07/06/12 2200

## 2012-07-05 NOTE — ED Notes (Addendum)
Pt from home.  Pt overdosed on 16 xanax (16 missing from bottle, counted by ems and pt husband).  Pt has hx of overdosing on xanax before.  Time of OD 1.5 hours ago.  Pt had to be suctioned by ems multiple times, clear bile return.  Pt responsive to pain.

## 2012-07-05 NOTE — ED Notes (Signed)
Report received from nurse,  Pt is overdose on xanax and alcohol.  Pt states she had a  Migraine for a week and that she was just upset so she took too much medication

## 2012-07-05 NOTE — ED Provider Notes (Signed)
Pt seen by Dr Clarene Duke.  On review labs, na 122. Pt groggy/lethargic, easily aroused. Is clearing/protecting airway. o2 sats 97%. rr 16. No resp distress.  Given etoh intoxication and bzd od, na 122, feel will require admission/obs. Discussed w [facility], Dr Vladimir Creeks, he states temp orders to tele bed, team 8, he will see.     Suzi Roots, MD 07/05/12 [phone]

## 2012-07-05 NOTE — ED Notes (Signed)
ZOX:WRUE<AV> Expected date:07/05/12<BR> Expected time: 2:14 PM<BR> Means of arrival:Ambulance<BR> Comments:<BR> S/A OD Xanax Unresponsive.

## 2012-07-06 ENCOUNTER — Encounter (HOSPITAL_COMMUNITY): Payer: Self-pay | Admitting: *Deleted

## 2012-07-06 DIAGNOSIS — F1020 Alcohol dependence, uncomplicated: Secondary | ICD-10-CM

## 2012-07-06 DIAGNOSIS — R0789 Other chest pain: Secondary | ICD-10-CM

## 2012-07-06 DIAGNOSIS — F4325 Adjustment disorder with mixed disturbance of emotions and conduct: Secondary | ICD-10-CM

## 2012-07-06 DIAGNOSIS — F1010 Alcohol abuse, uncomplicated: Secondary | ICD-10-CM

## 2012-07-06 DIAGNOSIS — F332 Major depressive disorder, recurrent severe without psychotic features: Secondary | ICD-10-CM

## 2012-07-06 LAB — BASIC METABOLIC PANEL
CO2: 21 mEq/L (ref 19–32)
Potassium: 4.4 mEq/L (ref 3.5–5.1)

## 2012-07-06 LAB — URINE CULTURE: Culture: NO GROWTH

## 2012-07-06 MED ORDER — INFLUENZA VIRUS VACC SPLIT PF IM SUSP
0.5000 mL | Freq: Once | INTRAMUSCULAR | Status: AC
  Administered 2012-07-06: 0.5 mL via INTRAMUSCULAR
  Filled 2012-07-06 (×2): qty 0.5

## 2012-07-06 MED ORDER — HYDRALAZINE HCL 20 MG/ML IJ SOLN
10.0000 mg | Freq: Four times a day (QID) | INTRAMUSCULAR | Status: DC | PRN
  Administered 2012-07-06 – 2012-07-07 (×2): 10 mg via INTRAVENOUS
  Filled 2012-07-06 (×2): qty 1

## 2012-07-06 MED ORDER — PNEUMOCOCCAL VAC POLYVALENT 25 MCG/0.5ML IJ INJ
0.5000 mL | INJECTION | Freq: Once | INTRAMUSCULAR | Status: AC
  Administered 2012-07-06: 0.5 mL via INTRAMUSCULAR
  Filled 2012-07-06 (×2): qty 0.5

## 2012-07-06 MED ORDER — NICOTINE 21 MG/24HR TD PT24
21.0000 mg | MEDICATED_PATCH | Freq: Every day | TRANSDERMAL | Status: DC
  Administered 2012-07-06 – 2012-07-08 (×3): 21 mg via TRANSDERMAL
  Filled 2012-07-06 (×3): qty 1

## 2012-07-06 NOTE — Progress Notes (Signed)
CSW was contacted by unit stating the the Psych MD would like to IVC the Pt due to having a Hx of multiple suicide attempts and substance abuse.   Pt was made aware by MD and nurse.   CSW completed the IVC paperwork, supervisor notorized MD signature on IVC paperwork, and CSW faxed paperwork to the magistrates office for completion.   CSW provided unit with multiple copies for chart and placed the originals in the [facility] chart.   Weekday CSW will f/u with Pt for SA assessment and d/c planning.   Leron Croak, LCSWA [employer] [phone]

## 2012-07-06 NOTE — Progress Notes (Signed)
Physician paged in reference to new order to discontinue patient's sitter after being admitted last night for suicide attempt. Patient has not been seen by psych today and given her history I believe patient should remain with sitter until psych sees her today. AC has been made aware.

## 2012-07-06 NOTE — Progress Notes (Signed)
Patient is very angry about not being discharged. She is blaming medical and nursing staff for recent events, while not really acknowledging that all that has transpired is the result of her suicide attempt. I attempted to discuss the rationale behind decisions made on today, but she is simply angry and anything resembling the truth or reality upsets her. We will continue to be as supportive as possible to the patient.
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
## 2012-07-06 NOTE — Progress Notes (Signed)
Patient ID: Brenda Rice, female   DOB: 1956/06/08, 57 y.o.   MRN: [id number]  [facility] PROGRESS NOTE  Brenda Rice:[id number] DOB: 1956-05-23 DOA: 07/05/2012 PCP: No primary provider on file.  Brief narrative: Patient is 57 year old female with PMH of suicide attempt and detox for substance abuse who was brought in by EMS after being found lethargic. Per EMS and family report, Pt states she told family she was "drinking beer and wine all day" then took "6 xanax" approx 1.5 to 2 hours PTA. Family states they "know she's depressed" but that pt had not been voicing SI to them recently. EMS noted pt to be lethargic but arousable and combative at times, placed nasal trumpet, suctioned mouth en route with strong gag.  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 Patient says that she does not know why she started drinking today but she denies any suicidal ideation. Patient complained of chest pain present in the upper left side of her chest since last 2 weeks. 4-5/10 at its worse. Worse with deep breathing and improved with blood pressure medications. Pain is not associated with exertion, eating. No palpitations noted. Patient is able to walk without getting short of breath or having any chest pain.  Patient is able to maintain her airway but her electrolytes revealed serum sodium of 122.    Principal Problem:  *Overdose of benzodiazepine - discussed at length attempt of of OD, pt denies SI - will continue to follow up on psych recommendations - inpt behavioral placement recommended  - appreciate psych input Active Problems:  Adjustment disorder with mixed disturbance of emotions and conduct - continue antidepressant   Hypertension - reasonable control inpatient   Nicotine addiction - discussed cessation - nicotine patch ordered Hyperkalemia - resolved and within normal limits this AM  Hyponatremia - secondary to dehydration, improving  - IVF provided, encourage PO  intake  Alcohol abuse - will place on CIWA protocol - CMET in AM - cessation discussed at length   Consultants:  Psych  Procedures/Studies: Dg Chest Port 1 View 07/05/2012   1.  Low lung volumes with vascular crowding and atelectasis.  2.  Increased interstitial markings could be due to bronchitis or interstitial edema.    Antibiotics:  None  Code Status: Full Family Communication: Pt at bedside  HPI/Subjective: No events overnight.   Objective: Filed Vitals:   07/06/12 0557 07/06/12 0806 07/06/12 1421 07/06/12 1700  BP: 149/84  176/88   Pulse: 111  91   Temp: 98.3 F (36.8 C)  97.4 F (36.3 C)   TempSrc: Oral  Oral   Resp: 17  18   Height:  5\' 3"  (1.6 m)    Weight:    71.714 kg (158 lb 1.6 oz)  SpO2: 91%  96%     Intake/Output Summary (Last 24 hours) at 07/06/12 1838 Last data filed at 07/06/12 1700  Gross per 24 hour  Intake    600 ml  Output      0 ml  Net    600 ml    Exam:   General:  Pt is alert, follows commands appropriately, not in acute distress  Cardiovascular: Regular rate and rhythm, S1/S2, no murmurs, no rubs, no gallops  Respiratory: Clear to auscultation bilaterally, no wheezing, no crackles, no rhonchi  Abdomen: Soft, non tender, non distended, bowel sounds present, no guarding  Extremities: No edema, pulses DP and PT palpable bilaterally  Neuro: Grossly nonfocal  Data Reviewed:  Basic Metabolic Panel:  Lab 07/06/12 1610 07/05/12 1516  NA 132* 122*  K 4.4 5.3*  CL 98 87*  CO2 21 18*  GLUCOSE 87 88  BUN 5* 4*  CREATININE 0.57 0.48*  CALCIUM 8.8 9.1  MG -- --  PHOS -- --   Liver Function Tests:  Lab 07/05/12 1516  AST 39*  ALT 39*  ALKPHOS 87  BILITOT 0.3  PROT 7.8  ALBUMIN 4.2    Lab 07/05/12 1516  LIPASE 22  AMYLASE --   No results found for this basename: AMMONIA:5 in the last 168 hours CBC:  Lab 07/05/12 1516  WBC 7.8  NEUTROABS 4.4  HGB 15.6*  HCT 43.2  MCV 95.8  PLT 141*   Cardiac Enzymes: No  results found for this basename: CKTOTAL:5,CKMB:5,CKMBINDEX:5,TROPONINI:5 in the last 168 hours BNP: No components found with this basename: POCBNP:5 CBG: No results found for this basename: GLUCAP:5 in the last 168 hours  Recent Results (from the past 240 hour(s))  URINE CULTURE     Status: Normal   Collection Time   07/05/12  2:50 PM      Component Value Range Status Comment   Specimen Description URINE, CATHETERIZED   Final    Special Requests NONE   Final    Culture  Setup Time 07/05/2012 18:50   Final    Colony Count NO GROWTH   Final    Culture NO GROWTH   Final    Report Status 07/06/2012 FINAL   Final      Scheduled Meds:   . ALPRAZolam  1 mg Oral BID  . aspirin EC  81 mg Oral Daily  . carbamazepine  600 mg Oral Daily  . citalopram  20 mg Oral Daily  . enoxaparin (LOVENOX) injection  40 mg Subcutaneous QHS  . nicotine  21 mg Transdermal Daily  . sodium chloride  3 mL Intravenous Q12H   Continuous Infusions:    [name], MD  [facility] Pager [phone]  If 7PM-7AM, please contact night-coverage [email or web address] Password [facility] 07/06/2012, 6:38 PM   LOS: 1 day

## 2012-07-06 NOTE — Progress Notes (Signed)
Utilization review completed.  

## 2012-07-06 NOTE — Progress Notes (Signed)
Patient has been made aware that she has been involuntarily committed. She again is not happy at all, but has been well informed of the indications for this decision as well as the risk related to her simply being able to "walk out". We will continue to keep the patient informed.

## 2012-07-07 LAB — COMPREHENSIVE METABOLIC PANEL
ALT: 21 U/L (ref 0–35)
AST: 22 U/L (ref 0–37)
Alkaline Phosphatase: 68 U/L (ref 39–117)
CO2: 22 mEq/L (ref 19–32)
GFR calc Af Amer: >90 mL/min (ref >90)
Glucose, Bld: 97 mg/dL (ref 70–99)
Potassium: 3.6 mEq/L (ref 3.5–5.1)
Sodium: 127 mEq/L — ABNORMAL LOW (ref 135–145)
Total Protein: 6.5 g/dL (ref 6.0–8.3)

## 2012-07-07 LAB — CBC
HCT: 38.9 % (ref 36.0–46.0)
MCH: 33.1 pg (ref 26.0–34.0)
MCHC: 34.2 g/dL (ref 30.0–36.0)
RDW: 13 % (ref 11.5–15.5)

## 2012-07-07 MED ORDER — DEXTROSE 5 % IV SOLN
1.0000 g | INTRAVENOUS | Status: DC
  Administered 2012-07-07 – 2012-07-08 (×2): 1 g via INTRAVENOUS
  Filled 2012-07-07 (×2): qty 10

## 2012-07-07 MED ORDER — LABETALOL HCL 100 MG PO TABS
100.0000 mg | ORAL_TABLET | Freq: Two times a day (BID) | ORAL | Status: DC
  Administered 2012-07-07 – 2012-07-08 (×2): 100 mg via ORAL
  Filled 2012-07-07 (×3): qty 1

## 2012-07-07 MED ORDER — OXYCODONE-ACETAMINOPHEN 5-325 MG PO TABS
1.0000 | ORAL_TABLET | Freq: Four times a day (QID) | ORAL | Status: DC | PRN
  Administered 2012-07-07: 2 via ORAL
  Filled 2012-07-07: qty 2

## 2012-07-07 MED ORDER — AZITHROMYCIN 500 MG PO TABS
500.0000 mg | ORAL_TABLET | ORAL | Status: DC
  Administered 2012-07-07 – 2012-07-08 (×2): 500 mg via ORAL
  Filled 2012-07-07 (×2): qty 1

## 2012-07-07 NOTE — Progress Notes (Addendum)
Patient ID: Brenda Rice, female   DOB: 08/12/1955, 57 y.o.   MRN: [id number]  [facility] PROGRESS NOTE  Brenda Rice FAO:[id number] DOB: Jan 23, 1956 DOA: 07/05/2012 PCP: No primary provider on file.  Brief narrative:  Patient is 57 year old female with PMH of suicide attempt and detox for substance abuse who was brought in by EMS after being found lethargic. Per EMS and family report, Pt states she told family she was "drinking beer and wine all day" then took "6 xanax" approx 1.5 to 2 hours PTA. Family states they "know she's depressed" but that pt had not been voicing SI to them recently. EMS noted pt to be lethargic but arousable and combative at times, placed nasal trumpet, suctioned mouth en route with strong gag.  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 Patient says that she does not know why she started drinking today but she denies any suicidal ideation. Patient complained of chest pain present in the upper left side of her chest since last 2 weeks. 4-5/10 at its worse. Worse with deep breathing and improved with blood pressure medications. Pain is not associated with exertion, eating. No palpitations noted. Patient is able to walk without getting short of breath or having any chest pain.  Patient is able to maintain her airway but her electrolytes revealed serum sodium of 122.   Principal Problem:  *Overdose of benzodiazepine  - discussed at length attempt of of OD, pt denies SI  - will continue to follow up on psych recommendations  - inpt behavioral placement recommended  - appreciate psych input  - Counseling provided  Active Problems:  Cough, productive of yellowish sputum - Unclear etiology and possibly related to acute on chronic bronchitis, evident on chest x-ray - Will treat empirically with Zithromax and Rocephin as patient has mild leukocytosis this morning and will obtain sputum analysis Adjustment disorder with mixed disturbance of emotions and conduct    - continue antidepressant  Hypertension  - Accelerated hypertension - Will add labetalol 100 mg twice a day - Continue as needed hydralazine Nicotine addiction  - discussed cessation  - nicotine patch ordered  Hyperkalemia  - resolved and within normal limits this AM  Hyponatremia  - secondary to dehydration, improving  - IVF provided, encourage PO intake  Alcohol abuse  - continue CIWA protocol  - CMET in AM  - cessation discussed at length   Consultants:  Psych Procedures/Studies:  Dg Chest Port 1 View  07/05/2012  1. Low lung volumes with vascular crowding and atelectasis.  2. Increased interstitial markings could be due to bronchitis or interstitial edema.  Antibiotics:  None Code Status: Full  Family Communication: Pt at bedside   HPI/Subjective: No events overnight.   Objective: Filed Vitals:   07/06/12 2258 07/07/12 0544 07/07/12 1348 07/07/12 1422  BP: 147/87 148/86 170/90 156/82  Pulse: 104 112  115  Temp:  98 F (36.7 C)  97.9 F (36.6 C)  TempSrc:  Oral  Oral  Resp:  20  18  Height:      Weight:      SpO2:    96%    Intake/Output Summary (Last 24 hours) at 07/07/12 1542 Last data filed at 07/07/12 1235  Gross per 24 hour  Intake   1960 ml  Output      0 ml  Net   1960 ml    Exam:   General:  Pt is alert, follows commands appropriately, not in acute distress  Cardiovascular: Regular rhythm, tachycardic, S1/S2, no murmurs, no rubs, no gallops  Respiratory: Clear to auscultation bilaterally, rales at bases  Abdomen: Soft, non tender, non distended, bowel sounds present, no guarding  Extremities: No edema, pulses DP and PT palpable bilaterally  Neuro: Grossly nonfocal  Data Reviewed: Basic Metabolic Panel:  Lab 07/07/12 8469 07/06/12 0536 07/05/12 1516  NA 127* 132* 122*  K 3.6 4.4 5.3*  CL 92* 98 87*  CO2 22 21 18*  GLUCOSE 97 87 88  BUN 7 5* 4*  CREATININE 0.54 0.57 0.48*  CALCIUM 9.0 8.8 9.1  MG -- -- --  PHOS -- -- --    Liver Function Tests:  Lab 07/07/12 0503 07/05/12 1516  AST 22 39*  ALT 21 39*  ALKPHOS 68 87  BILITOT 0.7 0.3  PROT 6.5 7.8  ALBUMIN 3.4* 4.2    Lab 07/05/12 1516  LIPASE 22  AMYLASE --   CBC:  Lab 07/07/12 0503 07/05/12 1516  WBC 10.9* 7.8  NEUTROABS -- 4.4  HGB 13.3 15.6*  HCT 38.9 43.2  MCV 96.8 95.8  PLT 155 141*   Recent Results (from the past 240 hour(s))  URINE CULTURE     Status: Normal   Collection Time   07/05/12  2:50 PM      Component Value Range Status Comment   Specimen Description URINE, CATHETERIZED   Final    Special Requests NONE   Final    Culture  Setup Time 07/05/2012 18:50   Final    Colony Count NO GROWTH   Final    Culture NO GROWTH   Final    Report Status 07/06/2012 FINAL   Final      Scheduled Meds:   . ALPRAZolam  1 mg Oral BID  . aspirin EC  81 mg Oral Daily  . carbamazepine  600 mg Oral Daily  . citalopram  20 mg Oral Daily  . enoxaparin (LOVENOX) injection  40 mg Subcutaneous QHS  . labetalol  100 mg Oral BID  . nicotine  21 mg Transdermal Daily  . sodium chloride  3 mL Intravenous Q12H   Continuous Infusions:    [name], MD  [facility] Pager [phone]  If 7PM-7AM, please contact night-coverage [email or web address] Password [email or web address] 07/07/2012, 3:42 PM   LOS: 2 days
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
## 2012-07-07 NOTE — Progress Notes (Signed)
Patient's blood pressure 170/90, patient complaining about a headache.  Patient given tylenol which wasn't working as per patient.  Dr. Izola Price made aware.  Patient medicated with 2 percocet for headache with relief.  Will continue to monitor.
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
## 2012-07-08 ENCOUNTER — Encounter (HOSPITAL_COMMUNITY): Payer: Self-pay | Admitting: *Deleted

## 2012-07-08 LAB — CBC
Hemoglobin: 11.2 g/dL — ABNORMAL LOW (ref 12.0–15.0)
MCH: 32.9 pg (ref 26.0–34.0)
MCV: 95.6 fL (ref 78.0–100.0)
Platelets: 129 10*3/uL — ABNORMAL LOW (ref 150–400)
RBC: 3.4 MIL/uL — ABNORMAL LOW (ref 3.87–5.11)
WBC: 7.8 10*3/uL (ref 4.0–10.5)

## 2012-07-08 LAB — BASIC METABOLIC PANEL
CO2: 23 mEq/L (ref 19–32)
Chloride: 86 mEq/L — ABNORMAL LOW (ref 96–112)
Glucose, Bld: 87 mg/dL (ref 70–99)
Sodium: 121 mEq/L — ABNORMAL LOW (ref 135–145)

## 2012-07-08 LAB — HIV ANTIBODY (ROUTINE TESTING W REFLEX): HIV: NONREACTIVE

## 2012-07-08 LAB — LEGIONELLA ANTIGEN, URINE: Legionella Antigen, Urine: NEGATIVE

## 2012-07-08 MED ORDER — POTASSIUM CHLORIDE CRYS ER 20 MEQ PO TBCR
40.0000 meq | EXTENDED_RELEASE_TABLET | Freq: Once | ORAL | Status: AC
  Administered 2012-07-08: 40 meq via ORAL
  Filled 2012-07-08: qty 2

## 2012-07-08 MED ORDER — AZITHROMYCIN 250 MG PO TABS: 500.0000 mg | ORAL_TABLET | ORAL | Status: DC

## 2012-07-08 MED ORDER — SODIUM CHLORIDE 0.9 % IV BOLUS (SEPSIS)
1000.0000 mL | Freq: Once | INTRAVENOUS | Status: AC
  Administered 2012-07-08: 1000 mL via INTRAVENOUS

## 2012-07-08 NOTE — Progress Notes (Signed)
Conferred with psych MD.  Psych MD asking CSW to contact Pt's husband for collateral information.  Received permission from Pt to contact her husband.  Spoke with Pt's husband, Mr. Fonner.  Mr. Flud was very cooperative and expressed concern for Pt.  He stated that Pt thinks he's having an affair (he laughs) and that Pt seems to have things on her mind that she won't talk with him about.  He stated that she's had some minor pxs at work that worry her and that he feels that she's let this get her down.  Mr. Bonneau stated that he doesn't feel that this was a suicide attempt; he stated that she was "probably just trying to get attention."  Mr. Pla stated that Pt had not expressed SI and that he was very surprised by the incident.  Mr. Kumagai stated that he feels that it'd be ok for Pt to return home; he stated that he'd like for her to return home.  He voiced his support with her getting outpt tx and stated that he thinks this would be a good idea for her.  CSW thanked Mr. Ressel for his time.  CSW to relay this information to psych MD.  Providence Crosby, LCSWA Clinical Social Work [phone]

## 2012-07-08 NOTE — Discharge Summary (Signed)
Physician Discharge Summary  Brenda Rice ZOX:[id number] DOB: [birth year] DOA: 07/05/2012  PCP: No primary provider on file.  Admit date: 07/05/2012 Discharge date: 07/08/2012  Recommendations for Outpatient Follow-up:  1. Pt will need to follow up with PCP in 2-3 weeks post discharge 2. Please obtain BMP to evaluate electrolytes and kidney function, potassium level 3. Please note that pt was given Kdur 40 MEQ prior to discharge 4. Please also check CBC to evaluate Hg and Hct levels 5. Recommendation to followup with outpatient mental health strongly emphasized  Discharge Diagnoses:  Overdose of benzodiazepine Principal Problem:  *Overdose of benzodiazepine Active Problems:  Adjustment disorder with mixed disturbance of emotions and conduct  Hypertension  Nicotine addiction  Hyponatremia  Atypical chest pain  Discharge Condition: Stable  Diet recommendation: Heart healthy diet discussed in details   Brief narrative:  Patient is 57 year old female with PMH of suicide attempt and detox for substance abuse who was brought in by EMS after being found lethargic. Per EMS and family report, Pt states she told family she was "drinking beer and wine all day" then took "6 xanax" approx 1.5 to 2 hours PTA. Family states they "know she's depressed" but that pt had not been voicing SI to them recently. EMS noted pt to be lethargic but arousable and combative at times, placed nasal trumpet, suctioned mouth en route with strong gag.  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 Patient says that she does not know why she started drinking today but she denies any suicidal ideation. Patient complained of chest pain present in the upper left side of her chest since last 2 weeks. 4-5/10 at its worse. Worse with deep breathing and improved with blood pressure medications. Pain is not associated with exertion, eating. No palpitations noted. Patient is able to walk without getting short of breath or  having any chest pain.  Patient is able to maintain her airway but her electrolytes revealed serum sodium of 122.  Principal Problem:  *Overdose of benzodiazepine  - discussed at length attempt of of OD, pt denies SI - will continue to follow up on psych recommendations  - Emphasized followup with outpatient mental health and patient agrees, verbalizes understanding  - appreciate psych input  - Counseling provided  Active Problems:  Cough, productive of yellowish sputum  - Urine strep pneumo positive, patient was started on Zithro and Rocephin - We are going to discontinue Rocephin - Patient will continue to take Zithromax for 5 more days after discharge  Adjustment disorder with mixed disturbance of emotions and conduct  - continue antidepressant  Hypertension  - Accelerated hypertension  Nicotine addiction  - discussed cessation  - nicotine patch ordered  Hypokalemia  - Patient was given one K. Dur 40 mEq by mouth prior to discharge Hyponatremia  - secondary to dehydration, improving  - IVF provided, encourage PO intake  Alcohol abuse  - continue CIWA protocol  - cessation discussed at length  Consultants:  Psych Procedures/Studies:  Dg Chest Port 1 View  07/05/2012  1. Low lung volumes with vascular crowding and atelectasis.  2. Increased interstitial markings could be due to bronchitis or interstitial edema.  Antibiotics:  Zithromax Code Status: Full  Family Communication: Pt at bedside   Discharge Exam: Filed Vitals:   07/08/12 1407  BP: 159/95  Pulse: 100  Temp: 97.8 F (36.6 C)  Resp: 20   Filed Vitals:   07/07/12 1422 07/07/12 2101 07/08/12 0543 07/08/12 1407  BP:  156/82 127/76 163/79 159/95  Pulse: 115 83 88 100  Temp: 97.9 F (36.6 C) 98.3 F (36.8 C) 98.3 F (36.8 C) 97.8 F (36.6 C)  TempSrc: Oral Oral Oral Oral  Resp: 18 18 20 20   Height:      Weight:      SpO2: 96% 96% 96% 98%    General: Pt is alert, follows commands appropriately, not  in acute distress Cardiovascular: Regular rhythm, tachycardia, S1/S2 +, no murmurs, no rubs, no gallops Respiratory: Clear to auscultation bilaterally, no wheezing, no crackles, no rhonchi Abdominal: Soft, non tender, non distended, bowel sounds +, no guarding Extremities: no edema, no cyanosis, pulses palpable bilaterally DP and PT Neuro: Grossly nonfocal  Discharge Instructions  Discharge Orders    Future Orders Please Complete By Expires   Diet - low sodium heart healthy      Increase activity slowly          Medication List     As of 07/08/2012  3:03 PM    STOP taking these medications         ALPRAZolam 1 MG tablet   Commonly known as: XANAX      TAKE these medications         azithromycin 250 MG tablet   Commonly known as: ZITHROMAX   Take 2 tablets (500 mg total) by mouth daily.      carbamazepine 200 MG 12 hr tablet   Commonly known as: TEGRETOL XR   Take 3 tablets (600 mg total) by mouth daily.      citalopram 20 MG tablet   Commonly known as: CELEXA   Take 1 tablet (20 mg total) by mouth daily.      lisinopril 40 MG tablet   Commonly known as: PRINIVIL,ZESTRIL   Take 1 tablet (40 mg total) by mouth daily.      metoprolol succinate 50 MG 24 hr tablet   Commonly known as: TOPROL-XL   Take 1 tablet (50 mg total) by mouth daily. Take with or immediately following a meal.      promethazine 12.5 MG tablet   Commonly known as: PHENERGAN   Take 1 tablet (12.5 mg total) by mouth every 8 (eight) hours as needed for nausea.           Follow-up Information    Please follow up. (Outpatient mental health in one week)           The results of significant diagnostics from this hospitalization (including imaging, microbiology, ancillary and laboratory) are listed below for reference.     Microbiology: Recent Results (from the past 240 hour(s))  URINE CULTURE     Status: Normal   Collection Time   07/05/12  2:50 PM      Component Value Range Status Comment    Specimen Description URINE, CATHETERIZED   Final    Special Requests NONE   Final    Culture  Setup Time 07/05/2012 18:50   Final    Colony Count NO GROWTH   Final    Culture NO GROWTH   Final    Report Status 07/06/2012 FINAL   Final   CULTURE, BLOOD (ROUTINE X 2)     Status: Normal (Preliminary result)   Collection Time   07/07/12  4:35 PM      Component Value Range Status Comment   Specimen Description BLOOD RIGHT ARM   Final    Special Requests BOTTLES DRAWN AEROBIC AND ANAEROBIC 5CC   Final  Culture  Setup Time 07/07/2012 22:23   Final    Culture     Final    Value:        BLOOD CULTURE RECEIVED NO GROWTH TO DATE CULTURE WILL BE HELD FOR 5 DAYS BEFORE ISSUING A FINAL NEGATIVE REPORT   Report Status PENDING   Incomplete   CULTURE, BLOOD (ROUTINE X 2)     Status: Normal (Preliminary result)   Collection Time   07/07/12  4:45 PM      Component Value Range Status Comment   Specimen Description BLOOD LEFT HAND   Final    Special Requests BOTTLES DRAWN AEROBIC ONLY 3CC   Final    Culture  Setup Time 07/07/2012 22:24   Final    Culture     Final    Value:        BLOOD CULTURE RECEIVED NO GROWTH TO DATE CULTURE WILL BE HELD FOR 5 DAYS BEFORE ISSUING A FINAL NEGATIVE REPORT   Report Status PENDING   Incomplete      Labs: Basic Metabolic Panel:  Lab 07/08/12 4540 07/07/12 0503 07/06/12 0536 07/05/12 1516  NA 121* 127* 132* 122*  K 3.3* 3.6 4.4 5.3*  CL 86* 92* 98 87*  CO2 23 22 21  18*  GLUCOSE 87 97 87 88  BUN 7 7 5* 4*  CREATININE 0.59 0.54 0.57 0.48*  CALCIUM 8.5 9.0 8.8 9.1  MG -- -- -- --  PHOS -- -- -- --   Liver Function Tests:  Lab 07/07/12 0503 07/05/12 1516  AST 22 39*  ALT 21 39*  ALKPHOS 68 87  BILITOT 0.7 0.3  PROT 6.5 7.8  ALBUMIN 3.4* 4.2    Lab 07/05/12 1516  LIPASE 22  AMYLASE --   CBC:  Lab 07/08/12 0532 07/07/12 0503 07/05/12 1516  WBC 7.8 10.9* 7.8  NEUTROABS -- -- 4.4  HGB 11.2* 13.3 15.6*  HCT 32.5* 38.9 43.2  MCV 95.6 96.8 95.8    PLT 129* 155 141*    SIGNED: Time coordinating discharge: Over 30 minutes  [name], MD  [facility] 07/08/2012, 3:03 PM Pager [phone]  If 7PM-7AM, please contact night-coverage [email or web address] Password [email or web address]

## 2012-07-08 NOTE — Consult Note (Signed)
Psychiatry Consult for Depression  Reason for Consult:  Depression, Overdose Referring Physician:  Dr. Izola Price. Brenda Rice is an 57 y.o. female  Assessment:   Axis I: Major Depression, Recurrent severe and Alcohol Dependence Axis II: Dependent Personality traits Axis III:  Past Medical History  Diagnosis Date  . Hypertension   . Osteoarthritis     right hip  . Fx clavicle     Hx of left clavicle fx  . Hx: UTI (urinary tract infection)    Axis IV: economic problems, occupational problems and problems with primary support group Axis V: GAF: 20  Plan:   1. Pt continues to deny any safety concerns since her admission.  2. Will recommend out psy follow up. SW already made arrangements. also recommend drug rehab.  3. Will recommend to send d/c summary to out psy provider.  HPI:  Subjective: Brenda Rice is a 57 y.o. female patient consulted for Depression.  Psychiatry is consulted for depression. S/p OD  Interval Hx;  Seen today and chart was reviewed. Reports feeling better now. Making plans to see out pt psy now. denies any SI now and when she came. Husband also not concerned about her safety and willing to come and pick her now. Feels stressed out due to family conflict now.   Traumatic Brain Injury: Negative       Past Medical History  Diagnosis Date  . Hypertension   . Depression   . Suicide attempt     x 3  . Osteoarthritis     right hip  . Fx clavicle     Hx of left clavicle fx  . Hx: UTI (urinary tract infection)    Past Surgical History  Procedure Date  . Orif left clavicle fracture and pinning 2nd metacarpal 01/03/2010    reports that she has been smoking Cigarettes.  She has a 80 pack-year smoking history. She has never used smokeless tobacco. She reports that she drinks about 2.4 ounces of alcohol per week. She reports that she does not use illicit drugs. History reviewed. No pertinent family history. Allergies:   Allergies  Allergen Reactions    . No Known Allergies     Objective: Blood pressure 159/95, pulse 100, temperature 97.8 F (36.6 C), temperature source Oral, resp. rate 20, height 5\' 3"  (1.6 m), weight 71.714 kg (158 lb 1.6 oz), SpO2 98.00%.Body mass index is 28.01 kg/(m^2). Results for orders placed during the hospital encounter of 07/05/12 (from the past 72 hour(s))  BASIC METABOLIC PANEL     Status: Abnormal   Collection Time   07/06/12  5:36 AM      Component Value Range Comment   Sodium 132 (*) 135 - 145 mEq/L    Potassium 4.4  3.5 - 5.1 mEq/L    Chloride 98  96 - 112 mEq/L    CO2 21  19 - 32 mEq/L    Glucose, Bld 87  70 - 99 mg/dL    BUN 5 (*) 6 - 23 mg/dL    Creatinine, Ser 1.61  0.50 - 1.10 mg/dL    Calcium 8.8  8.4 - 09.6 mg/dL    GFR calc non Af Amer >90  >90 mL/min    GFR calc Af Amer >90  >90 mL/min   COMPREHENSIVE METABOLIC PANEL     Status: Abnormal   Collection Time   07/07/12  5:03 AM      Component Value Range Comment   Sodium 127 (*) 135 - 145 mEq/L  Potassium 3.6  3.5 - 5.1 mEq/L DELTA CHECK NOTED   Chloride 92 (*) 96 - 112 mEq/L    CO2 22  19 - 32 mEq/L    Glucose, Bld 97  70 - 99 mg/dL    BUN 7  6 - 23 mg/dL    Creatinine, Ser 8.11  0.50 - 1.10 mg/dL    Calcium 9.0  8.4 - 91.4 mg/dL    Total Protein 6.5  6.0 - 8.3 g/dL    Albumin 3.4 (*) 3.5 - 5.2 g/dL    AST 22  0 - 37 U/L    ALT 21  0 - 35 U/L    Alkaline Phosphatase 68  39 - 117 U/L    Total Bilirubin 0.7  0.3 - 1.2 mg/dL    GFR calc non Af Amer >90  >90 mL/min    GFR calc Af Amer >90  >90 mL/min   CBC     Status: Abnormal   Collection Time   07/07/12  5:03 AM      Component Value Range Comment   WBC 10.9 (*) 4.0 - 10.5 K/uL    RBC 4.02  3.87 - 5.11 MIL/uL    Hemoglobin 13.3  12.0 - 15.0 g/dL    HCT 78.2  95.6 - 21.3 %    MCV 96.8  78.0 - 100.0 fL    MCH 33.1  26.0 - 34.0 pg    MCHC 34.2  30.0 - 36.0 g/dL    RDW 08.6  57.8 - 46.9 %    Platelets 155  150 - 400 K/uL   CULTURE, BLOOD (ROUTINE X 2)     Status: Normal  (Preliminary result)   Collection Time   07/07/12  4:35 PM      Component Value Range Comment   Specimen Description BLOOD RIGHT ARM      Special Requests BOTTLES DRAWN AEROBIC AND ANAEROBIC 5CC      Culture  Setup Time 07/07/2012 22:23      Culture        Value:        BLOOD CULTURE RECEIVED NO GROWTH TO DATE CULTURE WILL BE HELD FOR 5 DAYS BEFORE ISSUING A FINAL NEGATIVE REPORT   Report Status PENDING     HIV ANTIBODY (ROUTINE TESTING)     Status: Normal   Collection Time   07/07/12  4:43 PM      Component Value Range Comment   HIV NON REACTIVE  NON REACTIVE   CULTURE, BLOOD (ROUTINE X 2)     Status: Normal (Preliminary result)   Collection Time   07/07/12  4:45 PM      Component Value Range Comment   Specimen Description BLOOD LEFT HAND      Special Requests BOTTLES DRAWN AEROBIC ONLY 3CC      Culture  Setup Time 07/07/2012 22:24      Culture        Value:        BLOOD CULTURE RECEIVED NO GROWTH TO DATE CULTURE WILL BE HELD FOR 5 DAYS BEFORE ISSUING A FINAL NEGATIVE REPORT   Report Status PENDING     LEGIONELLA ANTIGEN, URINE     Status: Normal   Collection Time   07/07/12  5:20 PM      Component Value Range Comment   Specimen Description Urine      Special Requests NONE      Legionella Antigen, Urine Negative for Legionella pneumophilia serogroup 1  Report Status 07/08/2012 FINAL     STREP PNEUMONIAE URINARY ANTIGEN     Status: Abnormal   Collection Time   07/07/12  5:20 PM      Component Value Range Comment   Strep Pneumo Urinary Antigen POSITIVE (*) NEGATIVE PERFORMED AT [facility]  CBC     Status: Abnormal   Collection Time   07/08/12  5:32 AM      Component Value Range Comment   WBC 7.8  4.0 - 10.5 K/uL    RBC 3.40 (*) 3.87 - 5.11 MIL/uL    Hemoglobin 11.2 (*) 12.0 - 15.0 g/dL    HCT 09.6 (*) 04.5 - 46.0 %    MCV 95.6  78.0 - 100.0 fL    MCH 32.9  26.0 - 34.0 pg    MCHC 34.5  30.0 - 36.0 g/dL    RDW 40.9  81.1 - 91.4 %    Platelets 129 (*) 150 - 400  K/uL   BASIC METABOLIC PANEL     Status: Abnormal   Collection Time   07/08/12  5:32 AM      Component Value Range Comment   Sodium 121 (*) 135 - 145 mEq/L    Potassium 3.3 (*) 3.5 - 5.1 mEq/L    Chloride 86 (*) 96 - 112 mEq/L    CO2 23  19 - 32 mEq/L    Glucose, Bld 87  70 - 99 mg/dL    BUN 7  6 - 23 mg/dL    Creatinine, Ser 7.82  0.50 - 1.10 mg/dL    Calcium 8.5  8.4 - 95.6 mg/dL    GFR calc non Af Amer >90  >90 mL/min    GFR calc Af Amer >90  >90 mL/min    Labs are reviewed.  Current Facility-Administered Medications  Medication Dose Route Frequency Provider Last Rate Last Dose  . acetaminophen (TYLENOL) tablet 650 mg  650 mg Oral Q6H PRN Lars Mage, MD   650 mg at 07/08/12 1603   Or  . acetaminophen (TYLENOL) suppository 650 mg  650 mg Rectal Q6H PRN Lars Mage, MD      . ALPRAZolam Prudy Feeler) tablet 1 mg  1 mg Oral BID Lars Mage, MD   1 mg at 07/08/12 0956  . aspirin EC tablet 81 mg  81 mg Oral Daily Lars Mage, MD   81 mg at 07/08/12 0914  . azithromycin (ZITHROMAX) tablet 500 mg  500 mg Oral Q24H Dorothea Ogle, MD   500 mg at 07/08/12 1753  . carbamazepine (TEGRETOL XR) 12 hr tablet 600 mg  600 mg Oral Daily Lars Mage, MD   600 mg at 07/08/12 0914  . cefTRIAXone (ROCEPHIN) 1 g in dextrose 5 % 50 mL IVPB  1 g Intravenous Q24H Dorothea Ogle, MD   1 g at 07/08/12 1700  . citalopram (CELEXA) tablet 20 mg  20 mg Oral Daily Lars Mage, MD   20 mg at 07/08/12 0914  . enoxaparin (LOVENOX) injection 40 mg  40 mg Subcutaneous QHS Lars Mage, MD   40 mg at 07/06/12 2221  . hydrALAZINE (APRESOLINE) injection 10 mg  10 mg Intravenous Q6H PRN Rolan Lipa, NP   10 mg at 07/07/12 1346  . labetalol (NORMODYNE) tablet 100 mg  100 mg Oral BID Dorothea Ogle, MD   100 mg at 07/08/12 0913  . nicotine (NICODERM CQ - dosed in mg/24 hours) patch 21 mg  21 mg Transdermal Daily Iskra M  Izola Price, MD   21 mg at 07/08/12 0915  . ondansetron (ZOFRAN) tablet 4 mg  4 mg Oral Q6H PRN Lars Mage, MD         Or  . ondansetron (ZOFRAN) injection 4 mg  4 mg Intravenous Q6H PRN Lars Mage, MD      . oxyCODONE-acetaminophen (PERCOCET/ROXICET) 5-325 MG per tablet 1-2 tablet  1-2 tablet Oral Q6H PRN Dorothea Ogle, MD   2 tablet at 07/07/12 1638  . sodium chloride 0.9 % injection 3 mL  3 mL Intravenous Q12H Lars Mage, MD   3 mL at 07/06/12 2200    Psychiatric Specialty Exam: Physical Exam  Vitals reviewed. Constitutional: She appears well-developed and well-nourished. No distress.  Skin: She is not diaphoretic.    Review of Systems  Constitutional:       Reviewed from PCP note.    Blood pressure 159/95, pulse 100, temperature 97.8 F (36.6 C), temperature source Oral, resp. rate 20, height 5\' 3"  (1.6 m), weight 71.714 kg (158 lb 1.6 oz), SpO2 98.00%.Body mass index is 28.01 kg/(m^2).  General Appearance: Fairly Groomed  Patent attorney::  good  Speech:  Clear and Coherent and Normal Rate  Volume:  Normal  Mood:  better  Affect:  Restricted  Thought Process:  Coherent, Linear and Logical  Orientation:  Full (Time, Place, and Person)  Thought Content:  WDL  Suicidal Thoughts:  Patient denies.  Homicidal Thoughts:  Patient denies.  Memory:  fair  Judgement: fair  Insight:  fair  Psychomotor Activity:  fair  Concentration:  Fair  Akathisia:  No  AIMS (if indicated):   Not indicated  Assets:  Housing Talents/Skills  Sleep:   Poor   Wonda Cerise 07/08/2012 6:21 PM

## 2012-07-08 NOTE — Discharge Summary (Signed)
Patient discharged home with husband via wheelchair... Discharge instructions given and understood per patient.Marland KitchenMarland KitchenMarland Kitchen

## 2012-07-08 NOTE — Progress Notes (Signed)
Met with Pt today.  Pt apologized for omitting information yesterday.  She stated that she should not have done that and expressed remorse.  Pt stated that she does not want to go to [facility], as she and her husband are now on the same page with regard to the neighbor and that she needs to go back to work before her job is jeopardized.  Pt stated, again, that she was not attempting suicide.  Pt reported that her husband talked with the neighbor last night and that he has begun to distance himself from her.  Pt stated that her husband told her (Pt) that he wanted her to come home and that she is his priority.  CSW thanked Pt for her time.  Psych MD to assess Pt for [facility] appropriateness.  CSW to continue to follow.  Providence Crosby, LCSWA Clinical Social Work [phone]

## 2012-07-08 NOTE — Progress Notes (Signed)
Pt made an outpt appt with [facility] on 07/23/12 at 3:15.  Notified psych MD.  Providence Crosby, LCSWA Clinical Social Work [phone]

## 2012-07-09 ENCOUNTER — Ambulatory Visit: Payer: Managed Care, Other (non HMO) | Admitting: Family Medicine

## 2012-07-09 VITALS — BP 168/89 | HR 94 | Temp 98.1°F | Resp 18 | Ht 62.5 in | Wt 155.0 lb

## 2012-07-09 DIAGNOSIS — R110 Nausea: Secondary | ICD-10-CM

## 2012-07-09 DIAGNOSIS — R05 Cough: Secondary | ICD-10-CM

## 2012-07-09 MED ORDER — PROMETHAZINE HCL 12.5 MG PO TABS: 12.5000 mg | ORAL_TABLET | Freq: Three times a day (TID) | ORAL | Status: DC | PRN

## 2012-07-09 MED ORDER — CEFDINIR 300 MG PO CAPS: 300.0000 mg | ORAL_CAPSULE | Freq: Two times a day (BID) | ORAL | Status: DC

## 2012-07-09 NOTE — Patient Instructions (Addendum)
Instead of picking up azithromycin, pick up the omnicef (cefdnir) instead for your lungs.  Let me know if you are not better.  Let's recheck in about 2 weeks to look at your labs

## 2012-07-09 NOTE — Progress Notes (Signed)
[facility] [address] [place] [address] [phone]  Date:  07/09/2012   Name:  Brenda Rice   DOB:  28-Feb-1956   MRN:  [id number]  PCP:  No primary provider on file.    Chief Complaint: Hospital f/u and Medication Refill   History of Present Illness:  Brenda Rice is a 57 y.o. very pleasant female patient who presents with the following:  Brenda Rice was recently admitted from 1/11 to 1/14 due to a benzodiazepine overdose.  EMS brought her in after she was found to be lethargic at home.  She was drinking alcohol and then took several xanax.  However, she states she was not intending to harm herself, she "lost track of how many xanax" she had taken. She also denies any current desire to harm herself.   She was told to come see [place] in 2 or 3 weeks for BMP and CBC.  However, she came in today due to her difficulties last night: She was given a medication prior to discharge in an IV- she is not sure what this was. She was also given two pills which she thinks may have been her azithromycin.  She states that something caused her to become very ill and she developed dry heaves and threw up all the way home and for some of the night last night.  Today her stomach is feeling a little bit better, but she is concerned that she needs some more of her phenergan in case the vomiting comes back.  She was able to eat a little bit around noon.  She is not sure if the azithromycin made her ill-  She might like to change her antibiotic if possible.   Her notes and labs state that she was being treated for possible mild pneumonia/ bronchitis PORTABLE CHEST - 1 VIEW  Comparison: 09/02/2011.  Findings: The cardiac silhouette, mediastinal and hilar contours  are within normal limits. Low lung volumes with vascular crowding  and atelectasis. Increased interstitial markings could reflect  bronchitis or interstitial edema. No pleural effusion. Remote  healed rib fractures are noted.    IMPRESSION:  1. Low lung volumes with vascular crowding and atelectasis.  2. Increased interstitial markings could be due to bronchitis or  interstitial edema.  She also has a positive urine antigen for strep pneumo Patient Active Problem List  Diagnosis  . Respiratory failure  . Overdose of benzodiazepine  . Benzodiazepine overdose  . Adjustment disorder with mixed disturbance of emotions and conduct  . Hypertension  . Nicotine addiction  . Hyponatremia  . Atypical chest pain    Past Medical History  Diagnosis Date  . Hypertension   . Depression   . Suicide attempt     x 3  . Osteoarthritis     right hip  . Fx clavicle     Hx of left clavicle fx  . Hx: UTI (urinary tract infection)     Past Surgical History  Procedure Date  . Orif left clavicle fracture and pinning 2nd metacarpal 01/03/2010    History  Substance Use Topics  . Smoking status: Current Every Day Smoker -- 2.0 packs/day for 40 years    Types: Cigarettes  . Smokeless tobacco: Never Used  . Alcohol Use: 2.4 oz/week    3 Glasses of wine, 1 Cans of beer per week    No family history on file.  Allergies  Allergen Reactions  . No Known Allergies  Medication list has been reviewed and updated.  Current Outpatient Prescriptions on File Prior to Visit  Medication Sig Dispense Refill  . citalopram (CELEXA) 20 MG tablet Take 1 tablet (20 mg total) by mouth daily.  30 tablet  5  . lisinopril (PRINIVIL,ZESTRIL) 40 MG tablet Take 1 tablet (40 mg total) by mouth daily.  30 tablet  5  . metoprolol succinate (TOPROL-XL) 50 MG 24 hr tablet Take 1 tablet (50 mg total) by mouth daily. Take with or immediately following a meal.  30 tablet  1  . azithromycin (ZITHROMAX) 250 MG tablet Take 2 tablets (500 mg total) by mouth daily.  5 tablet  0  . carbamazepine (TEGRETOL XR) 200 MG 12 hr tablet Take 3 tablets (600 mg total) by mouth daily.  30 tablet  0  . promethazine (PHENERGAN) 12.5 MG tablet Take 1 tablet  (12.5 mg total) by mouth every 8 (eight) hours as needed for nausea.  20 tablet  0    Review of Systems:  As per HPI- otherwise negative.   Physical Examination: Filed Vitals:   07/09/12 1531  BP: 168/89  Pulse: 94  Temp: 98.1 F (36.7 C)  Resp: 18   Filed Vitals:   07/09/12 1531  Height: 5' 2.5" (1.588 m)  Weight: 155 lb (70.308 kg)   Body mass index is 27.90 kg/(m^2). Ideal Body Weight: Weight in (lb) to have BMI = 25: 138.6   GEN: WDWN, NAD, Non-toxic, A & O x 3, tobacco odor HEENT: Atraumatic, Normocephalic. Neck supple. No masses, No LAD. Ears and Nose: No external deformity. CV: RRR, No M/G/R. No JVD. No thrill. No extra heart sounds. PULM: CTA B, no wheezes, crackles, rhonchi. No retractions. No resp. distress. No accessory muscle use. ABD: S, NT, ND, +BS. No rebound. No HSM. Abdomen is benign EXTR: No c/c/e NEURO Normal gait.  PSYCH: Normally interactive. Conversant. Not depressed or anxious appearing.  Calm demeanor.    Assessment and Plan: 1. Cough  cefdinir (OMNICEF) 300 MG capsule  2. Nausea  promethazine (PHENERGAN) 12.5 MG tablet   Will change her coverage to [facility] as she may have had an adverse reaction to azithromycin. This should cover her strep pneumo without a problem.  Also refilled her phenergan to use if needed for nausea.  Cautioned her to use this sparingly as she already had a low sodium when she was discharged from the hospital and phenergan can cause hyponatremia.  She will only use it if absolutely necessary and will call me if still having nausea requiring phenergan use tomorrow   [name] will come back in for a lab recheck in 2 weeks.   Abbe Amsterdam, MD

## 2012-07-13 LAB — CULTURE, BLOOD (ROUTINE X 2)

## 2012-07-17 DIAGNOSIS — Z0271 Encounter for disability determination: Secondary | ICD-10-CM

## 2012-08-09 ENCOUNTER — Encounter (HOSPITAL_COMMUNITY): Payer: Self-pay | Admitting: Nurse Practitioner

## 2012-08-09 ENCOUNTER — Emergency Department (HOSPITAL_COMMUNITY)
Admission: EM | Admit: 2012-08-09 | Discharge: 2012-08-10 | Disposition: A | Discharge status: Home / Self Care | Payer: Managed Care, Other (non HMO) | Attending: Emergency Medicine | Admitting: Emergency Medicine

## 2012-08-09 DIAGNOSIS — T43591A Poisoning by other antipsychotics and neuroleptics, accidental (unintentional), initial encounter: Secondary | ICD-10-CM | POA: Insufficient documentation

## 2012-08-09 DIAGNOSIS — T50901A Poisoning by unspecified drugs, medicaments and biological substances, accidental (unintentional), initial encounter: Secondary | ICD-10-CM

## 2012-08-09 DIAGNOSIS — M169 Osteoarthritis of hip, unspecified: Secondary | ICD-10-CM | POA: Insufficient documentation

## 2012-08-09 DIAGNOSIS — Z79899 Other long term (current) drug therapy: Secondary | ICD-10-CM | POA: Insufficient documentation

## 2012-08-09 DIAGNOSIS — T424X4A Poisoning by benzodiazepines, undetermined, initial encounter: Secondary | ICD-10-CM | POA: Insufficient documentation

## 2012-08-09 DIAGNOSIS — Y929 Unspecified place or not applicable: Secondary | ICD-10-CM | POA: Insufficient documentation

## 2012-08-09 DIAGNOSIS — M1610 Unilateral primary osteoarthritis, unspecified hip: Secondary | ICD-10-CM | POA: Insufficient documentation

## 2012-08-09 DIAGNOSIS — I10 Essential (primary) hypertension: Secondary | ICD-10-CM | POA: Insufficient documentation

## 2012-08-09 DIAGNOSIS — Y9389 Activity, other specified: Secondary | ICD-10-CM | POA: Insufficient documentation

## 2012-08-09 DIAGNOSIS — F3289 Other specified depressive episodes: Secondary | ICD-10-CM | POA: Insufficient documentation

## 2012-08-09 DIAGNOSIS — Z87440 Personal history of urinary (tract) infections: Secondary | ICD-10-CM | POA: Insufficient documentation

## 2012-08-09 DIAGNOSIS — Z8781 Personal history of (healed) traumatic fracture: Secondary | ICD-10-CM | POA: Insufficient documentation

## 2012-08-09 DIAGNOSIS — F17200 Nicotine dependence, unspecified, uncomplicated: Secondary | ICD-10-CM | POA: Insufficient documentation

## 2012-08-09 DIAGNOSIS — F329 Major depressive disorder, single episode, unspecified: Secondary | ICD-10-CM | POA: Insufficient documentation

## 2012-08-09 DIAGNOSIS — F10929 Alcohol use, unspecified with intoxication, unspecified: Secondary | ICD-10-CM

## 2012-08-09 DIAGNOSIS — F1010 Alcohol abuse, uncomplicated: Secondary | ICD-10-CM | POA: Insufficient documentation

## 2012-08-09 LAB — CBC
HCT: 44 % (ref 36.0–46.0)
Hemoglobin: 15.3 g/dL — ABNORMAL HIGH (ref 12.0–15.0)
MCH: 33.8 pg (ref 26.0–34.0)
MCHC: 34.8 g/dL (ref 30.0–36.0)
MCV: 97.1 fL (ref 78.0–100.0)
Platelets: 188 10*3/uL (ref 150–400)
RBC: 4.53 MIL/uL (ref 3.87–5.11)
RDW: 13.8 % (ref 11.5–15.5)
WBC: 5.7 10*3/uL (ref 4.0–10.5)

## 2012-08-09 LAB — RAPID URINE DRUG SCREEN, HOSP PERFORMED
Amphetamines: NOT DETECTED
Barbiturates: NOT DETECTED
Benzodiazepines: POSITIVE — AB
Cocaine: NOT DETECTED
Opiates: NOT DETECTED
Tetrahydrocannabinol: NOT DETECTED

## 2012-08-09 LAB — URINALYSIS, ROUTINE W REFLEX MICROSCOPIC
Bilirubin Urine: NEGATIVE
Glucose, UA: NEGATIVE mg/dL
Hgb urine dipstick: NEGATIVE
Ketones, ur: NEGATIVE mg/dL
Leukocytes, UA: NEGATIVE
Nitrite: NEGATIVE
Protein, ur: NEGATIVE mg/dL
Specific Gravity, Urine: 1.007 (ref 1.005–1.030)
Urobilinogen, UA: 0.2 mg/dL (ref 0.0–1.0)
pH: 6 (ref 5.0–8.0)

## 2012-08-09 LAB — COMPREHENSIVE METABOLIC PANEL
ALT: 18 U/L (ref 0–35)
AST: 28 U/L (ref 0–37)
Albumin: 3.9 g/dL (ref 3.5–5.2)
Alkaline Phosphatase: 85 U/L (ref 39–117)
BUN: 5 mg/dL — ABNORMAL LOW (ref 6–23)
CO2: 22 mEq/L (ref 19–32)
Calcium: 8.9 mg/dL (ref 8.4–10.5)
Chloride: 94 mEq/L — ABNORMAL LOW (ref 96–112)
Creatinine, Ser: 0.54 mg/dL (ref 0.50–1.10)
GFR calc Af Amer: >90 mL/min (ref >90)
GFR calc non Af Amer: >90 mL/min (ref >90)
Glucose, Bld: 83 mg/dL (ref 70–99)
Potassium: 4.2 mEq/L (ref 3.5–5.1)
Sodium: 129 mEq/L — ABNORMAL LOW (ref 135–145)
Total Bilirubin: 0.4 mg/dL (ref 0.3–1.2)
Total Protein: 8.3 g/dL (ref 6.0–8.3)

## 2012-08-09 LAB — ACETAMINOPHEN LEVEL: Acetaminophen (Tylenol), Serum: <15 ug/mL (ref 10–30)

## 2012-08-09 LAB — ETHANOL: Alcohol, Ethyl (B): 202 mg/dL — ABNORMAL HIGH (ref 0–11)

## 2012-08-09 LAB — SALICYLATE LEVEL: Salicylate Lvl: <2 mg/dL — ABNORMAL LOW (ref 2.8–20.0)

## 2012-08-09 MED ORDER — SODIUM CHLORIDE 0.9 % IV BOLUS (SEPSIS): 1000.0000 mL | Freq: Once | INTRAVENOUS | Status: DC

## 2012-08-09 NOTE — ED Notes (Signed)
Pt states that her husband's phine number is [phone].

## 2012-08-09 NOTE — ED Notes (Addendum)
Pr EMS: pt consumed 8-9 Xanex pills today.  Pt stated that she is a "little bit sleepy and drank some whiskey".  En route VS: 140/70, 80 HR, 2L [place] 98%, EKG ok en route. Upon arrival, pt needed a fair amount of mouth suctioning.  Dr. Juleen China spoke with patient and pt denied this being an attempt to hurt herself.

## 2012-08-09 NOTE — ED Provider Notes (Signed)
History    57 year old female brought in for decreased mental status. Patient reportedly drinking whiskey and took an unknown number of husbands alprazolam. Happened shortly before arrival. Apparently there has been recent marital discord. Per review her records patient has a previous history of suicide attempt. Denies any other ingestion. No reported trauma. Patient unable to provide any additional useful history.     CSN: [id number]  Arrival date & time 08/09/12  [place]   First MD Initiated Contact with Patient 08/09/12 1848      Chief Complaint  Patient presents with  . Drug Overdose    (Consider location/radiation/quality/duration/timing/severity/associated sxs/prior treatment) HPI  Past Medical History  Diagnosis Date  . Hypertension   . Depression   . Suicide attempt     x 3  . Osteoarthritis     right hip  . Fx clavicle     Hx of left clavicle fx  . Hx: UTI (urinary tract infection)     Past Surgical History  Procedure Laterality Date  . Orif left clavicle fracture and pinning 2nd metacarpal  01/03/2010    History reviewed. No pertinent family history.  History  Substance Use Topics  . Smoking status: Current Every Day Smoker -- 2.00 packs/day for 40 years    Types: Cigarettes  . Smokeless tobacco: Never Used  . Alcohol Use: 2.4 oz/week    3 Glasses of wine, 1 Cans of beer per week    OB History   Grav Para Term Preterm Abortions TAB SAB Ect Mult Living                  Review of Systems  Level V caveat applies because of decreased mental status  Allergies  No known allergies  Home Medications   Current Outpatient Rx  Name  Route  Sig  Dispense  Refill  . EXPIRED: carbamazepine (TEGRETOL XR) 200 MG 12 hr tablet   Oral   Take 3 tablets (600 mg total) by mouth daily.   30 tablet   0   . cefdinir (OMNICEF) 300 MG capsule   Oral   Take 1 capsule (300 mg total) by mouth 2 (two) times daily.   20 capsule   0   . citalopram (CELEXA) 20 MG  tablet   Oral   Take 1 tablet (20 mg total) by mouth daily.   30 tablet   5   . lisinopril (PRINIVIL,ZESTRIL) 40 MG tablet   Oral   Take 1 tablet (40 mg total) by mouth daily.   30 tablet   5     Needs office visit for more refills   . metoprolol succinate (TOPROL-XL) 50 MG 24 hr tablet   Oral   Take 1 tablet (50 mg total) by mouth daily. Take with or immediately following a meal.   30 tablet   1   . promethazine (PHENERGAN) 12.5 MG tablet   Oral   Take 1 tablet (12.5 mg total) by mouth every 8 (eight) hours as needed for nausea.   20 tablet   0     BP 142/87  Pulse 77  Temp(Src) 98.1 F (36.7 C) (Oral)  Resp 17  SpO2 100%  Physical Exam  Nursing note and vitals reviewed. Constitutional: She appears well-developed.  No external signs of acute trauma  HENT:  Head: Normocephalic and atraumatic.  Eyes: Conjunctivae are normal. Pupils are equal, round, and reactive to light. Right eye exhibits no discharge. Left eye exhibits no discharge.  Neck: Neck supple.  Cardiovascular: Normal rate, regular rhythm and normal heart sounds.  Exam reveals no gallop and no friction rub.   No murmur heard. Pulmonary/Chest: Effort normal and breath sounds normal. No respiratory distress.  Abdominal: Soft. She exhibits no distension. There is no tenderness.  Musculoskeletal: She exhibits no edema and no tenderness.  Neurological:  GCS 10 E2, V3, M5. No obvious focal motor deficits.   Skin: Skin is warm and dry. She is not diaphoretic.    ED Course  Procedures (including critical care time)  Labs Reviewed  CBC - Abnormal; Notable for the following:    Hemoglobin 15.3 (*)    All other components within normal limits  COMPREHENSIVE METABOLIC PANEL - Abnormal; Notable for the following:    Sodium 129 (*)    Chloride 94 (*)    BUN 5 (*)    All other components within normal limits  ETHANOL - Abnormal; Notable for the following:    Alcohol, Ethyl (B) 202 (*)    All other  components within normal limits  URINE RAPID DRUG SCREEN (HOSP PERFORMED) - Abnormal; Notable for the following:    Benzodiazepines POSITIVE (*)    All other components within normal limits  SALICYLATE LEVEL - Abnormal; Notable for the following:    Salicylate Lvl <2.0 (*)    All other components within normal limits  ACETAMINOPHEN LEVEL  URINALYSIS, ROUTINE W REFLEX MICROSCOPIC   No results found.  EKG:  Rhythm:normal sinus Rate: 80 Axis: Normal Intervals:normal ST segments:normal   1. Drug overdose   2. Alcohol intoxication       MDM  57 year old female with alcohol and benzodiazepine ingestion. Intent of this overdose is not clear. Per review her records, patient does have a history of previous suicide attempts. Patient is protecting her airway and is HD stable. Will observe until clinically more sober and will obtain psychiatric consultation.        Raeford Razor, MD 08/10/12 [phone]

## 2012-08-10 MED ORDER — LISINOPRIL 20 MG PO TABS
40.0000 mg | ORAL_TABLET | Freq: Every day | ORAL | Status: DC
  Administered 2012-08-10: 40 mg via ORAL
  Filled 2012-08-10 (×2): qty 2

## 2012-08-10 MED ORDER — NICOTINE 21 MG/24HR TD PT24
21.0000 mg | MEDICATED_PATCH | Freq: Once | TRANSDERMAL | Status: DC
  Administered 2012-08-10: 21 mg via TRANSDERMAL

## 2012-08-10 MED ORDER — IBUPROFEN 600 MG PO TABS
600.0000 mg | ORAL_TABLET | Freq: Three times a day (TID) | ORAL | Status: DC | PRN
  Administered 2012-08-10: 600 mg via ORAL
  Filled 2012-08-10: qty 1

## 2012-08-10 MED ORDER — PROMETHAZINE HCL 25 MG PO TABS: 12.5000 mg | ORAL_TABLET | Freq: Three times a day (TID) | ORAL | Status: DC | PRN

## 2012-08-10 MED ORDER — ONDANSETRON HCL 4 MG PO TABS: 4.0000 mg | ORAL_TABLET | Freq: Three times a day (TID) | ORAL | Status: DC | PRN

## 2012-08-10 MED ORDER — ACETAMINOPHEN 325 MG PO TABS
650.0000 mg | ORAL_TABLET | ORAL | Status: DC | PRN
  Administered 2012-08-10: 650 mg via ORAL
  Filled 2012-08-10: qty 2

## 2012-08-10 MED ORDER — METOPROLOL SUCCINATE ER 50 MG PO TB24
50.0000 mg | ORAL_TABLET | Freq: Every day | ORAL | Status: DC
  Administered 2012-08-10: 50 mg via ORAL
  Filled 2012-08-10 (×2): qty 1

## 2012-08-10 MED ORDER — ACETAMINOPHEN 325 MG PO TABS
650.0000 mg | ORAL_TABLET | Freq: Once | ORAL | Status: AC
Start: 2012-08-10 — End: 2012-08-10
  Administered 2012-08-10: 650 mg via ORAL
  Filled 2012-08-10: qty 2

## 2012-08-10 MED ORDER — LORAZEPAM 1 MG PO TABS: 1.0000 mg | ORAL_TABLET | Freq: Three times a day (TID) | ORAL | Status: DC | PRN

## 2012-08-10 MED ORDER — ALUM & MAG HYDROXIDE-SIMETH 200-200-20 MG/5ML PO SUSP: 30.0000 mL | ORAL | Status: DC | PRN

## 2012-08-10 MED ORDER — CARBAMAZEPINE ER 400 MG PO TB12
600.0000 mg | ORAL_TABLET | Freq: Every day | ORAL | Status: DC
  Administered 2012-08-10: 600 mg via ORAL
  Filled 2012-08-10: qty 1

## 2012-08-10 MED ORDER — CITALOPRAM HYDROBROMIDE 20 MG PO TABS
20.0000 mg | ORAL_TABLET | Freq: Every day | ORAL | Status: DC
  Administered 2012-08-10: 20 mg via ORAL
  Filled 2012-08-10 (×2): qty 1

## 2012-08-10 NOTE — ED Notes (Signed)
Pt dc'd w/ writen instructions.  Pt encouraged to follow up w/ her psychiatrist, and contact her medical dr tomorrow concerning her BP, and return for any suicidal/homicidal thoughts.  Pt verbalized understanding.  Pt ambulatory to dc window w/ security, belongings returned after leaving the unit.  Pt's husband is here to pick her up.

## 2012-08-10 NOTE — ED Provider Notes (Addendum)
Appreciate telepsych recs. Dr. Rob Bunting feels that she is not suicidal or homicidal and stable to d/c. Medically stable otherwise. Patient no longer intoxicated and is not withdrawing.   Richardean Canal, MD 08/10/12 1447  Richardean Canal, MD 08/10/12 [phone]

## 2012-08-10 NOTE — ED Notes (Signed)
Dr Silverio Lay updated w/ BP and meds that are scheduled to be given

## 2012-08-10 NOTE — ED Notes (Signed)
Dr Juleen China aware of BP--have pt follow up with her family Dr. Per EDP

## 2012-08-10 NOTE — BHH Counselor (Signed)
Pt gave verbal and written consent to release information to her husband. Consent placed in pt's chart. She also said "yes" when writer asked if she wanted husband to visit her.  Evette Cristal, [place] Assessment Counselor

## 2012-08-10 NOTE — ED Notes (Signed)
To the desk on the phone 

## 2012-08-10 NOTE — ED Notes (Signed)
telepsych in progress 

## 2012-08-10 NOTE — ED Notes (Signed)
ACT Team at bedside.  

## 2012-08-10 NOTE — ED Notes (Signed)
Pharmacy into see 

## 2012-08-10 NOTE — ED Notes (Signed)
Dr. Silverio Lay aware of Pt's BP
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
## 2012-08-10 NOTE — ED Notes (Signed)
OK to dc home per Dr. Horvath 

## 2012-08-10 NOTE — ED Notes (Signed)
Up to the bathroom 

## 2012-08-10 NOTE — ED Notes (Signed)
Up to the desk on the phone 

## 2012-08-10 NOTE — BHH Counselor (Signed)
Patient has been accepted at [facility] by Verne Spurr PA pending bed availability.

## 2012-08-27 ENCOUNTER — Other Ambulatory Visit: Payer: Self-pay | Admitting: Emergency Medicine

## 2012-10-05 ENCOUNTER — Ambulatory Visit: Payer: Managed Care, Other (non HMO) | Admitting: Family Medicine

## 2012-10-05 VITALS — BP 190/106 | HR 116 | Temp 98.2°F | Resp 18 | Ht 63.0 in | Wt 158.0 lb

## 2012-10-05 DIAGNOSIS — H6122 Impacted cerumen, left ear: Secondary | ICD-10-CM

## 2012-10-05 DIAGNOSIS — I10 Essential (primary) hypertension: Secondary | ICD-10-CM

## 2012-10-05 DIAGNOSIS — H6120 Impacted cerumen, unspecified ear: Secondary | ICD-10-CM

## 2012-10-05 DIAGNOSIS — J441 Chronic obstructive pulmonary disease with (acute) exacerbation: Secondary | ICD-10-CM

## 2012-10-05 DIAGNOSIS — H833X2 Noise effects on left inner ear: Secondary | ICD-10-CM

## 2012-10-05 DIAGNOSIS — H833X9 Noise effects on inner ear, unspecified ear: Secondary | ICD-10-CM

## 2012-10-05 DIAGNOSIS — F411 Generalized anxiety disorder: Secondary | ICD-10-CM

## 2012-10-05 MED ORDER — ALBUTEROL SULFATE HFA 108 (90 BASE) MCG/ACT IN AERS: 2.0000 | INHALATION_SPRAY | Freq: Four times a day (QID) | RESPIRATORY_TRACT | Status: DC | PRN

## 2012-10-05 MED ORDER — METOPROLOL SUCCINATE ER 50 MG PO TB24: 50.0000 mg | ORAL_TABLET | Freq: Every day | ORAL | Status: DC

## 2012-10-05 MED ORDER — ALPRAZOLAM 1 MG PO TABS: 1.0000 mg | ORAL_TABLET | Freq: Two times a day (BID) | ORAL | Status: DC | PRN

## 2012-10-05 MED ORDER — LISINOPRIL 40 MG PO TABS: 40.0000 mg | ORAL_TABLET | Freq: Every day | ORAL | Status: DC

## 2012-10-05 MED ORDER — PREDNISONE 20 MG PO TABS: ORAL_TABLET | ORAL | Status: DC

## 2012-10-05 NOTE — Progress Notes (Signed)
Left ear full x 7 days, worse the last 24 hours.  Also, the right ear is starting to feel full.  Also, patient needs refills on her medicines  Also, patient is a smoker with chronic cough and wheezing.  Objective: No acute distress patient looks somewhat Haggard and has odor not unlike that of cigarettes HEENT: Left ear is obstructed with cerumen, right ear shows serous otitis media. Oropharynx shows mild postnasal drainage with poor dentition Chest: Diffuse and bilateral or wheezes Heart: Rapid about 100 beats per minute with no murmur, gallop or rub Extremities show no edema Skin warm and dry Blood pressure recheck 160/84

## 2012-11-22 ENCOUNTER — Emergency Department (HOSPITAL_COMMUNITY): Payer: Managed Care, Other (non HMO)

## 2012-11-22 ENCOUNTER — Emergency Department (HOSPITAL_COMMUNITY)
Admission: EM | Admit: 2012-11-22 | Discharge: 2012-11-22 | Discharge status: Against Medical Advice | Payer: Managed Care, Other (non HMO) | Attending: Emergency Medicine | Admitting: Emergency Medicine

## 2012-11-22 DIAGNOSIS — Y9289 Other specified places as the place of occurrence of the external cause: Secondary | ICD-10-CM | POA: Insufficient documentation

## 2012-11-22 DIAGNOSIS — F1010 Alcohol abuse, uncomplicated: Secondary | ICD-10-CM | POA: Insufficient documentation

## 2012-11-22 DIAGNOSIS — S0990XA Unspecified injury of head, initial encounter: Secondary | ICD-10-CM | POA: Insufficient documentation

## 2012-11-22 DIAGNOSIS — W1809XA Striking against other object with subsequent fall, initial encounter: Secondary | ICD-10-CM | POA: Insufficient documentation

## 2012-11-22 DIAGNOSIS — Y9389 Activity, other specified: Secondary | ICD-10-CM | POA: Insufficient documentation

## 2012-11-22 MED ORDER — IBUPROFEN 800 MG PO TABS
800.0000 mg | ORAL_TABLET | Freq: Once | ORAL | Status: AC
  Administered 2012-11-22: 800 mg via ORAL
  Filled 2012-11-22: qty 1

## 2012-11-22 MED ORDER — SODIUM CHLORIDE 0.9 % IV SOLN: INTRAVENOUS | Status: DC

## 2012-11-22 NOTE — ED Provider Notes (Signed)
Patient broughtto ER by EMS for fall with head laceration and hand injury. The patient is accompanied by her husband who is sober, the patient appears to be intoxicated and smells as alcohol, and a family friend who also appears sober.   Prior to my signing up for the patient the EMT told me the patient was getting dressed and trying to leave. I went over to talk to her and family/friends and said that she could have a head bleed and die if she leaves. The patient refuses to stay and the husband who is here as well as the friend say if she wants to leave that's fine, they are taking her home and not going to make her stay. We told her insurance will not cover her visit and that she needs to sign out AMA. The nurse, EMT and myself both told family members and charge that if she leaves without being seen she may not survive. They still left and refused to sign papers per nurse.  I was unable to examine patient but by appearance she was slurring her speech, and a wound on her head that was no longer bleeding behind her hair and bruises on her hand.   The husband and family members did not appear intoxicated and were of sound mind, both agreeing that they would not allow [place] to force her to stay.    Dorthula Matas, PA-C 11/22/12 2238

## 2012-11-22 NOTE — ED Notes (Signed)
Pt A & O x 4, with slurred speech accompanied by husband and neighbor, brought in by POV after passing out. Husband states she got dizzy after drinking a few drinks. She then fell again, losing balance striking head on the corner of a crock pot sustaining laceration to occipital area 3 hours after hitting her left head when she fell initially.

## 2012-11-22 NOTE — ED Notes (Signed)
Pt encouraged to stay at ED to seek medical treatment for fall and injuries to lt hand and possible head trauma. She is accompanied with husband who is encouraging her to seek treatment as well. She is aware of possible complications at home that may happen if medical treatment not sought.

## 2012-11-22 NOTE — ED Notes (Signed)
Pt states she had surgery on the lt. Hand, has pin in her hand currently.

## 2012-11-22 NOTE — ED Notes (Signed)
MD at bedside- Tiffany, [place] encouraged pt to seek medical treatment. A brief history was taken for patient by family and friend that lead to the cause of the visit tonight. Medical provider, Tiffany PA is unable to give thorough exam b/c pt is attempting to leave out of the room while medical provider is talking to her.

## 2012-11-23 NOTE — ED Provider Notes (Signed)
    Medical screening examination/treatment/procedure(s) were performed by non-physician practitioner and as supervising physician I was immediately available for consultation/collaboration.  Shelda Jakes, MD 11/23/12 [phone]

## 2013-02-15 ENCOUNTER — Encounter (HOSPITAL_COMMUNITY): Payer: Self-pay

## 2013-02-15 ENCOUNTER — Emergency Department (HOSPITAL_COMMUNITY)
Admission: EM | Admit: 2013-02-15 | Discharge: 2013-02-16 | Disposition: A | Discharge status: Home / Self Care | Payer: Managed Care, Other (non HMO) | Attending: Emergency Medicine | Admitting: Emergency Medicine

## 2013-02-15 DIAGNOSIS — Y9389 Activity, other specified: Secondary | ICD-10-CM | POA: Insufficient documentation

## 2013-02-15 DIAGNOSIS — Z87440 Personal history of urinary (tract) infections: Secondary | ICD-10-CM | POA: Insufficient documentation

## 2013-02-15 DIAGNOSIS — F4325 Adjustment disorder with mixed disturbance of emotions and conduct: Secondary | ICD-10-CM

## 2013-02-15 DIAGNOSIS — T50912A Poisoning by multiple unspecified drugs, medicaments and biological substances, intentional self-harm, initial encounter: Secondary | ICD-10-CM

## 2013-02-15 DIAGNOSIS — Z8781 Personal history of (healed) traumatic fracture: Secondary | ICD-10-CM | POA: Insufficient documentation

## 2013-02-15 DIAGNOSIS — T424X4A Poisoning by benzodiazepines, undetermined, initial encounter: Secondary | ICD-10-CM

## 2013-02-15 DIAGNOSIS — F10929 Alcohol use, unspecified with intoxication, unspecified: Secondary | ICD-10-CM

## 2013-02-15 DIAGNOSIS — F17200 Nicotine dependence, unspecified, uncomplicated: Secondary | ICD-10-CM | POA: Insufficient documentation

## 2013-02-15 DIAGNOSIS — F10229 Alcohol dependence with intoxication, unspecified: Secondary | ICD-10-CM | POA: Insufficient documentation

## 2013-02-15 DIAGNOSIS — J449 Chronic obstructive pulmonary disease, unspecified: Secondary | ICD-10-CM | POA: Diagnosis present

## 2013-02-15 DIAGNOSIS — F3289 Other specified depressive episodes: Secondary | ICD-10-CM | POA: Insufficient documentation

## 2013-02-15 DIAGNOSIS — Z79899 Other long term (current) drug therapy: Secondary | ICD-10-CM | POA: Insufficient documentation

## 2013-02-15 DIAGNOSIS — F329 Major depressive disorder, single episode, unspecified: Secondary | ICD-10-CM | POA: Insufficient documentation

## 2013-02-15 DIAGNOSIS — T424X1A Poisoning by benzodiazepines, accidental (unintentional), initial encounter: Secondary | ICD-10-CM | POA: Insufficient documentation

## 2013-02-15 DIAGNOSIS — Z8739 Personal history of other diseases of the musculoskeletal system and connective tissue: Secondary | ICD-10-CM | POA: Insufficient documentation

## 2013-02-15 DIAGNOSIS — Z7982 Long term (current) use of aspirin: Secondary | ICD-10-CM | POA: Insufficient documentation

## 2013-02-15 DIAGNOSIS — Y9289 Other specified places as the place of occurrence of the external cause: Secondary | ICD-10-CM | POA: Insufficient documentation

## 2013-02-15 DIAGNOSIS — F1994 Other psychoactive substance use, unspecified with psychoactive substance-induced mood disorder: Secondary | ICD-10-CM | POA: Diagnosis present

## 2013-02-15 DIAGNOSIS — X838XXA Intentional self-harm by other specified means, initial encounter: Secondary | ICD-10-CM

## 2013-02-15 DIAGNOSIS — R45851 Suicidal ideations: Secondary | ICD-10-CM | POA: Insufficient documentation

## 2013-02-15 DIAGNOSIS — I10 Essential (primary) hypertension: Secondary | ICD-10-CM | POA: Insufficient documentation

## 2013-02-15 DIAGNOSIS — F1020 Alcohol dependence, uncomplicated: Secondary | ICD-10-CM

## 2013-02-15 LAB — COMPREHENSIVE METABOLIC PANEL
ALT: 36 U/L — ABNORMAL HIGH (ref 0–35)
BUN: 5 mg/dL — ABNORMAL LOW (ref 6–23)
Calcium: 8.4 mg/dL (ref 8.4–10.5)
Chloride: 95 mEq/L — ABNORMAL LOW (ref 96–112)
Creatinine, Ser: 0.64 mg/dL (ref 0.50–1.10)
GFR calc Af Amer: >90 mL/min (ref >90)
GFR calc non Af Amer: >90 mL/min (ref >90)
Potassium: 4.3 mEq/L (ref 3.5–5.1)
Sodium: 129 mEq/L — ABNORMAL LOW (ref 135–145)

## 2013-02-15 LAB — URINALYSIS, ROUTINE W REFLEX MICROSCOPIC
Glucose, UA: NEGATIVE mg/dL
Hgb urine dipstick: NEGATIVE
Leukocytes, UA: NEGATIVE
Protein, ur: NEGATIVE mg/dL
Specific Gravity, Urine: 1.014 (ref 1.005–1.030)
pH: 5 (ref 5.0–8.0)

## 2013-02-15 LAB — CBC
Hemoglobin: 14.4 g/dL (ref 12.0–15.0)
MCH: 33.3 pg (ref 26.0–34.0)
MCHC: 34.3 g/dL (ref 30.0–36.0)
Platelets: 168 10*3/uL (ref 150–400)
RDW: 13.3 % (ref 11.5–15.5)

## 2013-02-15 LAB — RAPID URINE DRUG SCREEN, HOSP PERFORMED
Amphetamines: NOT DETECTED
Opiates: NOT DETECTED

## 2013-02-15 LAB — AMMONIA: Ammonia: 14 umol/L (ref 11–60)

## 2013-02-15 LAB — ACETAMINOPHEN LEVEL: Acetaminophen (Tylenol), Serum: <15 ug/mL (ref 10–30)

## 2013-02-15 LAB — ETHANOL: Alcohol, Ethyl (B): 153 mg/dL — ABNORMAL HIGH (ref 0–11)

## 2013-02-15 MED ORDER — CITALOPRAM HYDROBROMIDE 20 MG PO TABS
20.0000 mg | ORAL_TABLET | Freq: Every day | ORAL | Status: DC
  Administered 2013-02-16: 20 mg via ORAL
  Filled 2013-02-15: qty 1

## 2013-02-15 MED ORDER — ACETAMINOPHEN 325 MG PO TABS: 650.0000 mg | ORAL_TABLET | ORAL | Status: DC | PRN

## 2013-02-15 MED ORDER — VITAMIN B-1 100 MG PO TABS
100.0000 mg | ORAL_TABLET | Freq: Once | ORAL | Status: AC
  Administered 2013-02-15: 100 mg via ORAL
  Filled 2013-02-15: qty 1

## 2013-02-15 MED ORDER — LISINOPRIL 40 MG PO TABS
40.0000 mg | ORAL_TABLET | Freq: Every day | ORAL | Status: DC
Start: 2013-02-16 — End: 2013-02-16
  Administered 2013-02-16: 40 mg via ORAL
  Filled 2013-02-15: qty 1

## 2013-02-15 MED ORDER — ALBUTEROL SULFATE HFA 108 (90 BASE) MCG/ACT IN AERS: 2.0000 | INHALATION_SPRAY | Freq: Four times a day (QID) | RESPIRATORY_TRACT | Status: DC | PRN

## 2013-02-15 MED ORDER — ASPIRIN 325 MG PO TABS
325.0000 mg | ORAL_TABLET | Freq: Every day | ORAL | Status: DC
Start: 2013-02-15 — End: 2013-02-16
  Administered 2013-02-15 – 2013-02-16 (×2): 325 mg via ORAL
  Filled 2013-02-15 (×2): qty 1

## 2013-02-15 MED ORDER — METOPROLOL SUCCINATE ER 50 MG PO TB24
50.0000 mg | ORAL_TABLET | Freq: Every day | ORAL | Status: DC
  Administered 2013-02-15 – 2013-02-16 (×2): 50 mg via ORAL
  Filled 2013-02-15 (×2): qty 1

## 2013-02-15 MED ORDER — NICOTINE 21 MG/24HR TD PT24
21.0000 mg | MEDICATED_PATCH | Freq: Every day | TRANSDERMAL | Status: DC
  Administered 2013-02-15 – 2013-02-16 (×2): 21 mg via TRANSDERMAL
  Filled 2013-02-15 (×2): qty 1

## 2013-02-15 MED ORDER — SODIUM CHLORIDE 0.9 % IV SOLN
1000.0000 mL | INTRAVENOUS | Status: DC
  Administered 2013-02-15: 1000 mL via INTRAVENOUS

## 2013-02-15 MED ORDER — IBUPROFEN 200 MG PO TABS: 600.0000 mg | ORAL_TABLET | Freq: Three times a day (TID) | ORAL | Status: DC | PRN

## 2013-02-15 MED ORDER — THIAMINE HCL 100 MG/ML IJ SOLN: 100.0000 mg | Freq: Once | INTRAMUSCULAR | Status: DC

## 2013-02-15 MED ORDER — FOLIC ACID 1 MG PO TABS
1.0000 mg | ORAL_TABLET | Freq: Once | ORAL | Status: AC
  Administered 2013-02-15: 1 mg via ORAL
  Filled 2013-02-15 (×2): qty 1

## 2013-02-15 MED ORDER — ONDANSETRON HCL 4 MG PO TABS: 4.0000 mg | ORAL_TABLET | Freq: Three times a day (TID) | ORAL | Status: DC | PRN

## 2013-02-15 MED ORDER — SODIUM CHLORIDE 0.9 % IV SOLN: 1000.0000 mL | Freq: Once | INTRAVENOUS | Status: DC

## 2013-02-15 NOTE — Consult Note (Signed)
Telepsych Consultation   Reason for Consult:  ED referral Referring Physician:  ED providers Brenda Rice is an 57 y.o. female.  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 Hypertension   . Depression   . Suicide attempt     x 3  . Osteoarthritis     right hip  . Fx clavicle     Hx of left clavicle fx  . Hx: UTI (urinary tract infection)    AXIS IV:  problems with primary support group AXIS V:  11-20 some danger of hurting self or others possible OR occasionally fails to maintain minimal personal hygiene OR gross impairment in communication  Plan:  Recommend psychiatric Inpatient admission when medically cleared.  Subjective:   Brenda Rice is a 57 y.o. female patient admitted with alcohol dependence/acute intoxication./SIMD/Suicide attempt by OD  HPI:  Pt presented to ER for 6th time since 2003 with pattern of C/O marital discord leading to OD with xanax on top her alcoholism .See ED provider note and old chart HPI Elements:   Context:  pattern of alcoholism and suicide attemt as noted beginning in 2003.  Past Psychiatric History: Past Medical History  Diagnosis Date  . Hypertension   . Depression   . Suicide attempt     x 3  . Osteoarthritis     right hip  . Fx clavicle     Hx of left clavicle fx  . Hx: UTI (urinary tract infection)     reports that she has been smoking Cigarettes.  She has a 80 pack-year smoking history. She has never used smokeless tobacco. She reports that she drinks about 2.4 ounces of alcohol per week. She reports that she does not use illicit drugs. No family history on file.       Allergies:   Allergies  Allergen Reactions  . No Known Allergies     ACT Assessment Complete:  Yes:    Educational Status    Risk to Self: Risk to self Suicidal Ideation: Yes-Currently  Present Suicidal Intent: Yes-Currently Present Is patient at risk for suicide?: Yes Suicidal Plan?: Yes-Currently Present Specify Current Suicidal Plan: overdose Access to Means: Yes Specify Access to Suicidal Means: own pills What has been your use of drugs/alcohol within the last 12 months?: current significant alcohol use Previous Attempts/Gestures: Yes How many times?: 2 Triggers for Past Attempts: Spouse contact;Other (Comment) (husband's gambling and previous affair) Intentional Self Injurious Behavior: None Family Suicide History: No Recent stressful life event(s): Divorce;Other (Comment) (pt's son also having marital issues) Persecutory voices/beliefs?: No Depression: Yes Depression Symptoms: Fatigue;Loss of interest in usual pleasures Substance abuse history and/or treatment for substance abuse?: Yes Suicide prevention information given to non-admitted patients: Not applicable  Risk to Others: Risk to Others Homicidal Ideation: No Thoughts of Harm to Others: No Current Homicidal Intent: No Current Homicidal Plan: No Access to Homicidal Means: No History of harm to others?: No Assessment of Violence: None Noted Does patient have access to weapons?: No Criminal Charges Pending?: No Does patient have a court date: No  Abuse: Abuse/Neglect Assessment (Assessment to be complete while patient is alone) Physical Abuse: Denies Verbal Abuse: Denies Sexual Abuse: Denies Exploitation of patient/patient's resources: Denies  Prior Inpatient Therapy: Prior Inpatient Therapy Prior Inpatient Therapy: Yes ([facility] 2006) Prior Therapy Dates: 2013.   (3 total [facility] admits) Prior Therapy  Facilty/Provider(s): [facility] Reason for Treatment: psych/alcohol  Prior Outpatient Therapy: Prior Outpatient Therapy Prior Outpatient Therapy: No  Additional Information: Additional Information 1:1 In Past 12 Months?: No CIRT Risk: No Elopement Risk: No Does patient have medical clearance?: Yes                   Objective: Blood pressure 154/87, pulse 93, temperature 97.6 F (36.4 C), temperature source Oral, resp. rate 18, SpO2 97.00%.There is no weight on file to calculate BMI. Results for orders placed during the hospital encounter of 02/15/13 (from the past 72 hour(s))  CBC     Status: None   Collection Time    02/15/13  5:45 PM      Result Value Range   WBC 5.4  4.0 - 10.5 K/uL   RBC 4.32  3.87 - 5.11 MIL/uL   Hemoglobin 14.4  12.0 - 15.0 g/dL   HCT 45.4  09.8 - 11.9 %   MCV 97.2  78.0 - 100.0 fL   MCH 33.3  26.0 - 34.0 pg   MCHC 34.3  30.0 - 36.0 g/dL   RDW 14.7  82.9 - 56.2 %   Platelets 168  150 - 400 K/uL  URINE RAPID DRUG SCREEN (HOSP PERFORMED)     Status: Abnormal   Collection Time    02/15/13  7:17 PM      Result Value Range   Opiates NONE DETECTED  NONE DETECTED   Cocaine NONE DETECTED  NONE DETECTED   Benzodiazepines POSITIVE (*) NONE DETECTED   Amphetamines NONE DETECTED  NONE DETECTED   Tetrahydrocannabinol NONE DETECTED  NONE DETECTED   Barbiturates NONE DETECTED  NONE DETECTED   Comment:            DRUG SCREEN FOR MEDICAL PURPOSES     ONLY.  IF CONFIRMATION IS NEEDED     FOR ANY PURPOSE, NOTIFY LAB     WITHIN 5 DAYS.                LOWEST DETECTABLE LIMITS     FOR URINE DRUG SCREEN     Drug Class       Cutoff (ng/mL)     Amphetamine      1000     Barbiturate      200     Benzodiazepine   200     Tricyclics       300     Opiates          300     Cocaine          300     THC              50  URINALYSIS, ROUTINE W REFLEX MICROSCOPIC     Status: None   Collection Time    02/15/13  7:17 PM      Result Value Range   Color, Urine YELLOW  YELLOW   APPearance CLEAR  CLEAR   Specific Gravity, Urine 1.014  1.005 - 1.030   pH 5.0  5.0 - 8.0   Glucose, UA NEGATIVE  NEGATIVE mg/dL   Hgb urine dipstick NEGATIVE  NEGATIVE   Bilirubin Urine NEGATIVE  NEGATIVE   Ketones, ur NEGATIVE  NEGATIVE mg/dL   Protein, ur NEGATIVE  NEGATIVE  mg/dL   Urobilinogen, UA 0.2  0.0 - 1.0 mg/dL   Nitrite NEGATIVE  NEGATIVE   Leukocytes, UA NEGATIVE  NEGATIVE   Comment: MICROSCOPIC NOT DONE ON URINES WITH NEGATIVE PROTEIN, BLOOD,  LEUKOCYTES, NITRITE, OR GLUCOSE <1000 mg/dL.  AMMONIA     Status: None   Collection Time    02/15/13  7:55 PM      Result Value Range   Ammonia 14  11 - 60 umol/L  ACETAMINOPHEN LEVEL     Status: None   Collection Time    02/15/13  7:55 PM      Result Value Range   Acetaminophen (Tylenol), Serum <15.0  10 - 30 ug/mL   Comment:            THERAPEUTIC CONCENTRATIONS VARY     SIGNIFICANTLY. A RANGE OF 10-30     ug/mL MAY BE AN EFFECTIVE     CONCENTRATION FOR MANY PATIENTS.     HOWEVER, SOME ARE BEST TREATED     AT CONCENTRATIONS OUTSIDE THIS     RANGE.     ACETAMINOPHEN CONCENTRATIONS     >150 ug/mL AT 4 HOURS AFTER     INGESTION AND >50 ug/mL AT 12     HOURS AFTER INGESTION ARE     OFTEN ASSOCIATED WITH TOXIC     REACTIONS.  ETHANOL     Status: Abnormal   Collection Time    02/15/13  7:55 PM      Result Value Range   Alcohol, Ethyl (B) 153 (*) 0 - 11 mg/dL   Comment:            LOWEST DETECTABLE LIMIT FOR     SERUM ALCOHOL IS 11 mg/dL     FOR MEDICAL PURPOSES ONLY  COMPREHENSIVE METABOLIC PANEL     Status: Abnormal   Collection Time    02/15/13  7:55 PM      Result Value Range   Sodium 129 (*) 135 - 145 mEq/L   Potassium 4.3  3.5 - 5.1 mEq/L   Chloride 95 (*) 96 - 112 mEq/L   CO2 23  19 - 32 mEq/L   Glucose, Bld 84  70 - 99 mg/dL   BUN 5 (*) 6 - 23 mg/dL   Creatinine, Ser 8.29  0.50 - 1.10 mg/dL   Calcium 8.4  8.4 - 56.2 mg/dL   Total Protein 6.9  6.0 - 8.3 g/dL   Albumin 3.6  3.5 - 5.2 g/dL   AST 38 (*) 0 - 37 U/L   ALT 36 (*) 0 - 35 U/L   Alkaline Phosphatase 66  39 - 117 U/L   Total Bilirubin 0.3  0.3 - 1.2 mg/dL   GFR calc non Af Amer >90  >90 mL/min   GFR calc Af Amer >90  >90 mL/min   Comment: (NOTE)     The eGFR has been calculated using the CKD EPI equation.     This  calculation has not been validated in all clinical situations.     eGFR's persistently <90 mL/min signify possible Chronic Kidney     Disease.  SALICYLATE LEVEL     Status: Abnormal   Collection Time    02/15/13  7:55 PM      Result Value Range   Salicylate Lvl <2.0 (*) 2.8 - 20.0 mg/dL   Labs are reviewed and are pertinent for electrolyte disturbance./hypoxemia (copd-drug related)/alcoholic liver disease  Current Facility-Administered Medications  Medication Dose Route Frequency Provider Last Rate Last Dose  . 0.9 %  sodium chloride infusion  1,000 mL Intravenous Once Lyanne Co, MD       Followed by  . 0.9 %  sodium chloride infusion  1,000 mL  Intravenous Continuous Lyanne Co, MD      . acetaminophen (TYLENOL) tablet 650 mg  650 mg Oral Q4H PRN Lyanne Co, MD      . albuterol (PROVENTIL HFA;VENTOLIN HFA) 108 (90 BASE) MCG/ACT inhaler 2 puff  2 puff Inhalation Q6H PRN Lyanne Co, MD      . aspirin tablet 325 mg  325 mg Oral Daily Lyanne Co, MD      . citalopram (CELEXA) tablet 20 mg  20 mg Oral Daily Lyanne Co, MD      . folic acid (FOLVITE) tablet 1 mg  1 mg Oral Once Lyanne Co, MD      . ibuprofen (ADVIL,MOTRIN) tablet 600 mg  600 mg Oral Q8H PRN Lyanne Co, MD      . lisinopril (PRINIVIL,ZESTRIL) tablet 40 mg  40 mg Oral Daily Lyanne Co, MD      . metoprolol succinate (TOPROL-XL) 24 hr tablet 50 mg  50 mg Oral Daily Lyanne Co, MD      . nicotine (NICODERM CQ - dosed in mg/24 hours) patch 21 mg  21 mg Transdermal Daily Lyanne Co, MD   21 mg at 02/15/13 2128  . ondansetron (ZOFRAN) tablet 4 mg  4 mg Oral Q8H PRN Lyanne Co, MD       Current Outpatient Prescriptions  Medication Sig Dispense Refill  . albuterol (PROVENTIL HFA;VENTOLIN HFA) 108 (90 BASE) MCG/ACT inhaler Inhale 2 puffs into the lungs every 6 (six) hours as needed for wheezing.  3 Inhaler  3  . ALPRAZolam (XANAX) 1 MG tablet Take 1 tablet (1 mg total) by mouth 2 (two)  times daily as needed for sleep or anxiety.  180 tablet  1  . Aspirin-Acetaminophen-Caffeine (GOODY HEADACHE PO) Take 1 packet by mouth every 8 (eight) hours as needed (pain).      . citalopram (CELEXA) 20 MG tablet Take 1 tablet (20 mg total) by mouth daily.  30 tablet  5  . lisinopril (PRINIVIL,ZESTRIL) 40 MG tablet Take 1 tablet (40 mg total) by mouth daily.  90 tablet  3  . metoprolol succinate (TOPROL-XL) 50 MG 24 hr tablet Take 1 tablet (50 mg total) by mouth daily. Take with meal. Must have office visit for further refills.  90 tablet  3    Psychiatric Specialty Exam:     Blood pressure 154/87, pulse 93, temperature 97.6 F (36.4 C), temperature source Oral, resp. rate 18, SpO2 97.00%.There is no weight on file to calculate BMI.  General Appearance: Disheveled  Eye Solicitor::  Fair  Speech:  Slurred  Volume:  Normal  Mood:  Dysphoric  Affect:  Congruent  Thought Process:  Circumstantial  Orientation:  Full (Time, Place, and Person)  Thought Content:  Rumination over husband cheating /same as all 6 previous episodes  Suicidal Thoughts:  No denies  Homicidal Thoughts:  No  Memory:  Immediate;   Fair  Judgement:  Impaired  Insight:  Lacking  Psychomotor Activity:  Decreased  Concentration:  Poor  Recall:  Fair  Akathisia:  NA  Handed:  Right  AIMS (if indicated):     Assets:  Resilience  Sleep:   No disturbance reported   Treatment Plan Summary: Admit to inpatient dual diagnosis facility for detox and treatment when medically cleared  Disposition:  PER TREATMENT PLAN  Maryjean Morn E 02/15/2013 9:50 PM

## 2013-02-15 NOTE — ED Notes (Signed)
Per EMS patient has been drinking since yesterday. (Wine, beer, shots) Information obtained from husband. Husband called after finding pt passed out on floor. Hx of SI and OD. Patient has had thoughts of killing herself per EMS. EMS at home multiple times for same reason.

## 2013-02-15 NOTE — BH Assessment (Signed)
Assessment Note  Brenda Rice is an 57 y.o. female. Pt comes to [facility] due to overdose of 20 one mg xanax, along with alcohol.  Pt reports that her husband informed her last night that he wants a divorce.  He is having an affair with a neighbor.  Pt is not admitting to this being a suicide attempt, however pt is judged to be a suicide risk at this time.  Pt denies HI/AV.  Pt has history of at least 2 prior suicide attempts related to marital issues and several prior [facility] admits as well.  Pt reports she drinks 5x per week, 2-4 beers per time and is reporting withdrawal symptoms.    Axis I: Depressive Disorder NOS and alcohol dependence Axis II: Deferred Axis III:  Past Medical History  Diagnosis Date  . Hypertension   . Depression   . Suicide attempt     x 3  . Osteoarthritis     right hip  . Fx clavicle     Hx of left clavicle fx  . Hx: UTI (urinary tract infection)    Axis IV: problems with primary support group Axis V: 21-30 behavior considerably influenced by delusions or hallucinations OR serious impairment in judgment, communication OR inability to function in almost all areas  Past Medical History:  Past Medical History  Diagnosis Date  . Hypertension   . Depression   . Suicide attempt     x 3  . Osteoarthritis     right hip  . Fx clavicle     Hx of left clavicle fx  . Hx: UTI (urinary tract infection)     Past Surgical History  Procedure Laterality Date  . Orif left clavicle fracture and pinning 2nd metacarpal  01/03/2010    Family History: No family history on file.  Social History:  reports that she has been smoking Cigarettes.  She has a 80 pack-year smoking history. She has never used smokeless tobacco. She reports that she drinks about 2.4 ounces of alcohol per week. She reports that she does not use illicit drugs.  Additional Social History:  Alcohol / Drug Use Pain Medications: pt denies Prescriptions: pt denies Over the Counter: pt denies History of  alcohol / drug use?: Yes Negative Consequences of Use: Personal relationships Withdrawal Symptoms: Nausea / Vomiting Substance #1 Name of Substance 1: alcohol 1 - Age of First Use: 14 1 - Amount (size/oz): 2-4 beers 1 - Frequency: 5x week 1 - Duration: 5 years 1 - Last Use / Amount: 8/24, 4 beers  CIWA: CIWA-Ar BP: 154/87 mmHg Pulse Rate: 93 Nausea and Vomiting: 3 Tactile Disturbances: none Tremor: no tremor Auditory Disturbances: not present Paroxysmal Sweats: no sweat visible Visual Disturbances: very mild sensitivity Anxiety: mildly anxious Headache, Fullness in Head: mild Agitation: normal activity Orientation and Clouding of Sensorium: oriented and can do serial additions CIWA-Ar Total: 7 COWS:    Allergies:  Allergies  Allergen Reactions  . No Known Allergies     Home Medications:  (Not in a hospital admission)  OB/GYN Status:  No LMP recorded. Patient is postmenopausal.  General Assessment Data Location of Assessment: [facility] ED ACT Assessment: Yes Is this a Tele or Face-to-Face Assessment?: Face-to-Face Is this an Initial Assessment or a Re-assessment for this encounter?: Initial Assessment Living Arrangements: Spouse/significant other Can pt return to current living arrangement?: Yes Admission Status: Voluntary Is patient capable of signing voluntary admission?: Yes Transfer from: Acute Hospital Referral Source: Self/Family/Friend     [facility] Crisis  Care Plan Living Arrangements: Spouse/significant other Name of Psychiatrist: none Name of Therapist: none     Risk to self Suicidal Ideation: Yes-Currently Present Suicidal Intent: Yes-Currently Present Is patient at risk for suicide?: Yes Suicidal Plan?: Yes-Currently Present Specify Current Suicidal Plan: overdose Access to Means: Yes Specify Access to Suicidal Means: own pills What has been your use of drugs/alcohol within the last 12 months?: current significant alcohol use Previous  Attempts/Gestures: Yes How many times?: 2 Triggers for Past Attempts: Spouse contact;Other (Comment) (husband's gambling and previous affair) Intentional Self Injurious Behavior: None Family Suicide History: No Recent stressful life event(s): Divorce;Other (Comment) (pt's son also having marital issues) Persecutory voices/beliefs?: No Depression: Yes Depression Symptoms: Fatigue;Loss of interest in usual pleasures Substance abuse history and/or treatment for substance abuse?: Yes Suicide prevention information given to non-admitted patients: Not applicable  Risk to Others Homicidal Ideation: No Thoughts of Harm to Others: No Current Homicidal Intent: No Current Homicidal Plan: No Access to Homicidal Means: No History of harm to others?: No Assessment of Violence: None Noted Does patient have access to weapons?: No Criminal Charges Pending?: No Does patient have a court date: No  Psychosis Hallucinations: None noted Delusions: None noted  Mental Status Report Appear/Hygiene: Disheveled Eye Contact: Fair Motor Activity: Unremarkable Speech: Logical/coherent Level of Consciousness: Quiet/awake Mood: Depressed Affect: Appropriate to circumstance Anxiety Level: Minimal Thought Processes: Coherent;Relevant Judgement: Unimpaired Orientation: Person;Place;Time;Situation Obsessive Compulsive Thoughts/Behaviors: None  Cognitive Functioning Concentration: Normal Memory: Recent Intact;Remote Intact IQ: Average Insight: Good Impulse Control: Poor Appetite: Fair Weight Loss: 0 Weight Gain: 25 Sleep: No Change Total Hours of Sleep: 5 Vegetative Symptoms: None  ADLScreening [facility] Assessment Services) Patient's cognitive ability adequate to safely complete daily activities?: Yes Patient able to express need for assistance with ADLs?: Yes Independently performs ADLs?: Yes (appropriate for developmental age)  Prior Inpatient Therapy Prior Inpatient Therapy: Yes ([facility] 2006) Prior Therapy Dates: 2013.   (3 total [facility] admits) Prior Therapy Facilty/Provider(s): [facility] Reason for Treatment: psych/alcohol  Prior Outpatient Therapy Prior Outpatient Therapy: No  ADL Screening (condition at time of admission) Patient's cognitive ability adequate to safely complete daily activities?: Yes Patient able to express need for assistance with ADLs?: Yes Independently performs ADLs?: Yes (appropriate for developmental age)       Abuse/Neglect Assessment (Assessment to be complete while patient is alone) Physical Abuse: Denies Verbal Abuse: Denies Sexual Abuse: Denies Exploitation of patient/patient's resources: Denies Values / Beliefs Cultural Requests During Hospitalization: None Spiritual Requests During Hospitalization: None   Advance Directives (For Healthcare) Advance Directive: Patient does not have advance directive;Patient would like information Patient requests advance directive information: Advance directive packet given    Additional Information 1:1 In Past 12 Months?: No CIRT Risk: No Elopement Risk: No Does patient have medical clearance?: Yes     Disposition:  Disposition Initial Assessment Completed for this Encounter: Yes Disposition of Patient: Inpatient treatment program Type of inpatient treatment program: Adult  On Site Evaluation by:   Reviewed with Physician:    Lorri Frederick 02/15/2013 10:01 PM

## 2013-02-15 NOTE — ED Notes (Signed)
140palpated, 92hr, cbg 117, resp16

## 2013-02-15 NOTE — ED Provider Notes (Signed)
CSN: [id number]     Arrival date & time 02/15/13  1647 History     First MD Initiated Contact with Patient 02/15/13 1655     Chief Complaint  Patient presents with  . Alcohol Intoxication   (Consider location/radiation/quality/duration/timing/severity/associated sxs/prior Treatment) The history is provided by the EMS personnel. History limited by: Level V caveat: Altered mental status.   EMS was called the patient's house because of ongoing alcohol intoxication.  The patient reportedly has been having some marital issues with her husband as reported that she's been drinking consistently since yesterday.  It is unknown as to whether or not she ingested anything else.  She is on Xanax at home.  She has a history of depression and suicide attempts before in the past.  No empty pill bottles were noted.  No suicide notes were written.  Blood sugar was normal per EMS.  Patient was transported to ER.  Patient is unable to provide any significant history at this time.  I attempted to call the husband and is not answering his phone.   Past Medical History  Diagnosis Date  . Hypertension   . Depression   . Suicide attempt     x 3  . Osteoarthritis     right hip  . Fx clavicle     Hx of left clavicle fx  . Hx: UTI (urinary tract infection)    Past Surgical History  Procedure Laterality Date  . Orif left clavicle fracture and pinning 2nd metacarpal  01/03/2010   No family history on file. History  Substance Use Topics  . Smoking status: Current Every Day Smoker -- 2.00 packs/day for 40 years    Types: Cigarettes  . Smokeless tobacco: Never Used  . Alcohol Use: 2.4 oz/week    3 Glasses of wine, 1 Cans of beer per week     Comment: drinks daily   OB History   Grav Para Term Preterm Abortions TAB SAB Ect Mult Living                 Review of Systems  Unable to perform ROS   Allergies  No known allergies  Home Medications   Current Outpatient Rx  Name  Route  Sig  Dispense   Refill  . albuterol (PROVENTIL HFA;VENTOLIN HFA) 108 (90 BASE) MCG/ACT inhaler   Inhalation   Inhale 2 puffs into the lungs every 6 (six) hours as needed for wheezing.   3 Inhaler   3   . ALPRAZolam (XANAX) 1 MG tablet   Oral   Take 1 tablet (1 mg total) by mouth 2 (two) times daily as needed for sleep or anxiety.   180 tablet   1   . aspirin 325 MG tablet   Oral   Take 325 mg by mouth daily.         . Aspirin-Acetaminophen-Caffeine (GOODY HEADACHE PO)   Oral   Take 1 packet by mouth every 8 (eight) hours as needed (pain).         . citalopram (CELEXA) 20 MG tablet   Oral   Take 1 tablet (20 mg total) by mouth daily.   30 tablet   5   . lisinopril (PRINIVIL,ZESTRIL) 40 MG tablet   Oral   Take 1 tablet (40 mg total) by mouth daily.   90 tablet   3     Needs office visit for more refills   . metoprolol succinate (TOPROL-XL) 50 MG 24 hr tablet   Oral  Take 1 tablet (50 mg total) by mouth daily. Take with meal. Must have office visit for further refills.   90 tablet   3   . promethazine (PHENERGAN) 12.5 MG tablet   Oral   Take 1 tablet (12.5 mg total) by mouth every 8 (eight) hours as needed for nausea.   20 tablet   0    BP 154/87  Pulse 93  Temp(Src) 97.6 F (36.4 C) (Oral)  Resp 18  SpO2 97% Physical Exam  Nursing note and vitals reviewed. Constitutional: She appears well-developed and well-nourished. No distress.  HENT:  Head: Normocephalic and atraumatic.  Eyes: EOM are normal.  Neck: Normal range of motion.  Cardiovascular: Normal rate, regular rhythm and normal heart sounds.   Pulmonary/Chest: Effort normal and breath sounds normal.  Abdominal: Soft. She exhibits no distension. There is no tenderness.  Musculoskeletal: Normal range of motion.  Neurological:  Opens eyes to sternal rub.  Follows only very simple commands  Skin: Skin is warm and dry.  Psychiatric: She has a normal mood and affect. Judgment normal.    ED Course   Procedures  (including critical care time)  Labs Reviewed  URINE RAPID DRUG SCREEN (HOSP PERFORMED) - Abnormal; Notable for the following:    Benzodiazepines POSITIVE (*)    All other components within normal limits  ETHANOL - Abnormal; Notable for the following:    Alcohol, Ethyl (B) 153 (*)    All other components within normal limits  COMPREHENSIVE METABOLIC PANEL - Abnormal; Notable for the following:    Sodium 129 (*)    Chloride 95 (*)    BUN 5 (*)    AST 38 (*)    ALT 36 (*)    All other components within normal limits  SALICYLATE LEVEL - Abnormal; Notable for the following:    Salicylate Lvl <2.0 (*)    All other components within normal limits  CBC  AMMONIA  URINALYSIS, ROUTINE W REFLEX MICROSCOPIC  ACETAMINOPHEN LEVEL   No results found. No diagnosis found.  MDM  8:58 PM Patient now admits that she took 21 mg Xanax was drinking alcohol today.  She states "I was trying to get my husband's attention".  It's unclear if this was a true suicide attempt or more a cry for help.  History depression and the fact that she found out that her husband is divorcing her yesterday think it's worthwhile to have the behavior health team see her.  Lyanne Co, MD 02/15/13 2101

## 2013-02-15 NOTE — ED Notes (Signed)
Patient states that her husband is cheating on her and the mistress came over to the house, which is why she began drinking.

## 2013-02-16 DIAGNOSIS — F1010 Alcohol abuse, uncomplicated: Secondary | ICD-10-CM

## 2013-02-16 DIAGNOSIS — F332 Major depressive disorder, recurrent severe without psychotic features: Secondary | ICD-10-CM

## 2013-02-16 LAB — COMPREHENSIVE METABOLIC PANEL
Alkaline Phosphatase: 74 U/L (ref 39–117)
BUN: 5 mg/dL — ABNORMAL LOW (ref 6–23)
CO2: 25 mEq/L (ref 19–32)
Calcium: 9.1 mg/dL (ref 8.4–10.5)
GFR calc Af Amer: >90 mL/min (ref >90)
GFR calc non Af Amer: >90 mL/min (ref >90)
Glucose, Bld: 84 mg/dL (ref 70–99)
Total Protein: 7.1 g/dL (ref 6.0–8.3)

## 2013-02-16 MED ORDER — LORAZEPAM 1 MG PO TABS: 0.0000 mg | ORAL_TABLET | Freq: Two times a day (BID) | ORAL | Status: DC

## 2013-02-16 MED ORDER — LORAZEPAM 1 MG PO TABS: 0.0000 mg | ORAL_TABLET | Freq: Four times a day (QID) | ORAL | Status: DC

## 2013-02-16 NOTE — Progress Notes (Signed)
Patient Identification:  Brenda Rice Date of Evaluation:  02/16/2013   History of Present Illness:  This is one of ER visits for this patient starting from 2003 for same reasons.  Patient overdosed on her Xanax when she realized her husband is ending their marriage.  She states  They have long hx of marital issues and her husband cheating on her.  She reports taking 10 tablets of her Xanax and not 20 as first reported by her earlier.  She reports past OD on Celexa a while ago but does not remember when.  She denies having alcohol problem but states she drinks socially.  She drank 3 beers the day she took the Xanax but reports that was not her daily drinking pattern.  She states she took both xanax and beer to "forget and end" her problems.  Today she is remorseful, tearful and cried in between the assessment.  She denies she is feeling depressed but states what her husband did made her felt hopeless and worthless.  Her deterrent from hurting herself are a loving son, 3 grand sons and a wonder job.  She plans to move to [place] with her sister, move her job there and secure a therapist.  She denies SI/HI/AVH.  Past Psychiatric History:  Yes, [facility]   Past Medical History:     Past Medical History  Diagnosis Date  . Hypertension   . Depression   . Suicide attempt     x 3  . Osteoarthritis     right hip  . Fx clavicle     Hx of left clavicle fx  . Hx: UTI (urinary tract infection)        Past Surgical History  Procedure Laterality Date  . Orif left clavicle fracture and pinning 2nd metacarpal  01/03/2010    Allergies:  Allergies  Allergen Reactions  . No Known Allergies     Current Medications:  Prior to Admission medications   Medication Sig Start Date End Date Taking? Authorizing Provider  ALPRAZolam Prudy Feeler) 1 MG tablet Take 1 tablet (1 mg total) by mouth 2 (two) times daily as needed for sleep or anxiety. 10/05/12  Yes Elvina Sidle, MD  Aspirin-Acetaminophen-Caffeine (GOODY  HEADACHE PO) Take 1 packet by mouth every 8 (eight) hours as needed (pain).   Yes Historical Provider, MD  citalopram (CELEXA) 20 MG tablet Take 1 tablet (20 mg total) by mouth daily. 07/01/12 07/01/13 Yes Phillips Odor, MD  lisinopril (PRINIVIL,ZESTRIL) 40 MG tablet Take 1 tablet (40 mg total) by mouth daily. 10/05/12  Yes Elvina Sidle, MD  tetrahydrozoline-zinc (VISINE-AC) 0.05-0.25 % ophthalmic solution Place 2 drops into both eyes 3 (three) times daily as needed (dry eyes).   Yes Historical Provider, MD  albuterol (PROVENTIL HFA;VENTOLIN HFA) 108 (90 BASE) MCG/ACT inhaler Inhale 2 puffs into the lungs every 6 (six) hours as needed for wheezing. 10/05/12   Elvina Sidle, MD  metoprolol succinate (TOPROL-XL) 50 MG 24 hr tablet Take 1 tablet (50 mg total) by mouth daily. Take with meal. Must have office visit for further refills. 10/05/12   Elvina Sidle, MD    Social History:    reports that she has been smoking Cigarettes.  She has a 80 pack-year smoking history. She has never used smokeless tobacco. She reports that she drinks about 2.4 ounces of alcohol per week. She reports that she does not use illicit drugs.   Family History:    No family history on file.  Mental Status Examination/Evaluation:  Alert,  disheveled, caucasian emale who appears stated age is seen this am.  She is calm and cooperative and willingly answered questions.   She exhibited good eye eye contact through out the interview.   She denies feeling depressed and her affect is inconruent.   Her attention and concentration are normal, her thought process and content are WNL.  She denies SI/HI/AVH and she does not exhibit any paranoia.  Her insight and judgement is fair.   DIAGNOSIS:   AXIS I   ALCOHOL ABUSE, SUICIDE ATTEMPT, Major depressive d/o recurrent and severe.  AXIS II  Deffered  AXIS III See medical notes.  AXIS IV housing problems, other psychosocial or environmental problems and problems related to social  environment  AXIS V 61-70 mild symptoms     Assessment/Plan:  Consult and face to face interview with Dr Lolly Mustache Patient will be d/c home today with plan to move in with her support-sister in [place] Patient plans to seek a psychiatrist and therapist in [place] Patient will ask for job transfer to [place] when she relocates.  I have personally seen the patient and agreed with the findings and involved in the treatment plan. Kathryne Sharper, MD

## 2013-02-16 NOTE — Progress Notes (Signed)
ED CM discussed noted elevations in bp with EDP

## 2013-02-16 NOTE — ED Provider Notes (Signed)
Patient presented to the ER for evaluation of alcohol problems. She has been evaluated and arrangements are being made for the patient to have outpatient treatments. I did reevaluate the patient this morning and she contracts for safety, denies any suicidality or homicidality.  Gilda Crease, MD 02/16/13 [phone]

## 2013-02-16 NOTE — ED Notes (Signed)
Pt transferred from main ed, presents for medical clearance.  Pt SI, depressed, states husband of 34 yrs asked her for a Divorce yesterday.  Pt reports husbands mistress came to house, thereafter she drank beer and ingested 20/1mg  Xanax tabs as suicide attempt.  Denies HI or AV hallucinations, admits to drinking 1-2 beers/day.  Attempted SI, 3-4 yrs ago--took pills also.  Pt calm & cooperative.

## 2013-02-16 NOTE — Progress Notes (Signed)
Per psychiatric NP, patient psychiatrically stable for discharge home. Pt plans to be moving this week to [place] with pt sister, and plans to follow up with a therapist and psychiatrist in [place]. CSW will inform Rn and EDP.   Marland KitchenCatha Gosselin, [place] [phone]  ED CSW .02/16/2013 11:15am

## 2013-05-24 ENCOUNTER — Encounter (HOSPITAL_COMMUNITY): Payer: Self-pay | Admitting: Emergency Medicine

## 2013-05-24 ENCOUNTER — Emergency Department (HOSPITAL_COMMUNITY)
Admission: EM | Admit: 2013-05-24 | Discharge: 2013-05-25 | Disposition: A | Discharge status: Other Acute Inpatient Hospital | Payer: Managed Care, Other (non HMO) | Attending: Emergency Medicine | Admitting: Emergency Medicine

## 2013-05-24 DIAGNOSIS — F17200 Nicotine dependence, unspecified, uncomplicated: Secondary | ICD-10-CM | POA: Insufficient documentation

## 2013-05-24 DIAGNOSIS — Z79899 Other long term (current) drug therapy: Secondary | ICD-10-CM | POA: Insufficient documentation

## 2013-05-24 DIAGNOSIS — T424X4A Poisoning by benzodiazepines, undetermined, initial encounter: Secondary | ICD-10-CM | POA: Insufficient documentation

## 2013-05-24 DIAGNOSIS — F1010 Alcohol abuse, uncomplicated: Secondary | ICD-10-CM | POA: Insufficient documentation

## 2013-05-24 DIAGNOSIS — F329 Major depressive disorder, single episode, unspecified: Secondary | ICD-10-CM | POA: Insufficient documentation

## 2013-05-24 DIAGNOSIS — Z8781 Personal history of (healed) traumatic fracture: Secondary | ICD-10-CM | POA: Insufficient documentation

## 2013-05-24 DIAGNOSIS — Z8739 Personal history of other diseases of the musculoskeletal system and connective tissue: Secondary | ICD-10-CM | POA: Insufficient documentation

## 2013-05-24 DIAGNOSIS — I10 Essential (primary) hypertension: Secondary | ICD-10-CM | POA: Insufficient documentation

## 2013-05-24 DIAGNOSIS — F3289 Other specified depressive episodes: Secondary | ICD-10-CM | POA: Insufficient documentation

## 2013-05-24 DIAGNOSIS — R45851 Suicidal ideations: Secondary | ICD-10-CM

## 2013-05-24 DIAGNOSIS — T43502A Poisoning by unspecified antipsychotics and neuroleptics, intentional self-harm, initial encounter: Secondary | ICD-10-CM | POA: Insufficient documentation

## 2013-05-24 DIAGNOSIS — Z87440 Personal history of urinary (tract) infections: Secondary | ICD-10-CM | POA: Insufficient documentation

## 2013-05-24 DIAGNOSIS — T424X2A Poisoning by benzodiazepines, intentional self-harm, initial encounter: Secondary | ICD-10-CM

## 2013-05-24 LAB — COMPREHENSIVE METABOLIC PANEL
ALT: 25 U/L (ref 0–35)
AST: 31 U/L (ref 0–37)
Alkaline Phosphatase: 61 U/L (ref 39–117)
CO2: 24 mEq/L (ref 19–32)
Chloride: 92 mEq/L — ABNORMAL LOW (ref 96–112)
Creatinine, Ser: 0.55 mg/dL (ref 0.50–1.10)
GFR calc non Af Amer: >90 mL/min (ref >90)
Potassium: 4.5 mEq/L (ref 3.5–5.1)
Sodium: 130 mEq/L — ABNORMAL LOW (ref 135–145)
Total Bilirubin: 0.3 mg/dL (ref 0.3–1.2)

## 2013-05-24 LAB — GLUCOSE, CAPILLARY: Glucose-Capillary: 66 mg/dL — ABNORMAL LOW (ref 70–99)

## 2013-05-24 LAB — CBC
MCV: 97.3 fL (ref 78.0–100.0)
Platelets: 136 10*3/uL — ABNORMAL LOW (ref 150–400)
RBC: 4.12 MIL/uL (ref 3.87–5.11)
WBC: 7.3 10*3/uL (ref 4.0–10.5)

## 2013-05-24 LAB — RAPID URINE DRUG SCREEN, HOSP PERFORMED
Amphetamines: NOT DETECTED
Barbiturates: NOT DETECTED
Benzodiazepines: POSITIVE — AB
Cocaine: NOT DETECTED
Tetrahydrocannabinol: NOT DETECTED

## 2013-05-24 LAB — ACETAMINOPHEN LEVEL: Acetaminophen (Tylenol), Serum: <15 ug/mL (ref 10–30)

## 2013-05-24 MED ORDER — IBUPROFEN 200 MG PO TABS
600.0000 mg | ORAL_TABLET | Freq: Three times a day (TID) | ORAL | Status: DC | PRN
  Administered 2013-05-24: 600 mg via ORAL
  Filled 2013-05-24: qty 3

## 2013-05-24 MED ORDER — ONDANSETRON HCL 4 MG PO TABS: 4.0000 mg | ORAL_TABLET | Freq: Three times a day (TID) | ORAL | Status: DC | PRN

## 2013-05-24 MED ORDER — NICOTINE 21 MG/24HR TD PT24
21.0000 mg | MEDICATED_PATCH | Freq: Once | TRANSDERMAL | Status: DC
  Administered 2013-05-24: 21 mg via TRANSDERMAL
  Filled 2013-05-24: qty 1

## 2013-05-24 MED ORDER — ZOLPIDEM TARTRATE 5 MG PO TABS: 5.0000 mg | ORAL_TABLET | Freq: Every evening | ORAL | Status: DC | PRN

## 2013-05-24 MED ORDER — SODIUM CHLORIDE 0.9 % IV SOLN
INTRAVENOUS | Status: DC
  Administered 2013-05-24: 15:00:00 via INTRAVENOUS

## 2013-05-24 MED ORDER — ONDANSETRON HCL 4 MG/2ML IJ SOLN
4.0000 mg | Freq: Once | INTRAMUSCULAR | Status: AC
  Administered 2013-05-24: 4 mg via INTRAVENOUS
  Filled 2013-05-24: qty 2

## 2013-05-24 MED ORDER — ACETAMINOPHEN 325 MG PO TABS: 650.0000 mg | ORAL_TABLET | ORAL | Status: DC | PRN

## 2013-05-24 MED ORDER — ALBUTEROL SULFATE HFA 108 (90 BASE) MCG/ACT IN AERS: 2.0000 | INHALATION_SPRAY | Freq: Four times a day (QID) | RESPIRATORY_TRACT | Status: DC | PRN

## 2013-05-24 MED ORDER — ALUM & MAG HYDROXIDE-SIMETH 200-200-20 MG/5ML PO SUSP: 30.0000 mL | ORAL | Status: DC | PRN

## 2013-05-24 MED ORDER — LORAZEPAM 1 MG PO TABS: 1.0000 mg | ORAL_TABLET | Freq: Four times a day (QID) | ORAL | Status: DC | PRN

## 2013-05-24 MED ORDER — LISINOPRIL 40 MG PO TABS
40.0000 mg | ORAL_TABLET | Freq: Every morning | ORAL | Status: DC
  Administered 2013-05-25: 40 mg via ORAL
  Filled 2013-05-24: qty 1

## 2013-05-24 MED ORDER — NICOTINE 21 MG/24HR TD PT24
21.0000 mg | MEDICATED_PATCH | Freq: Every day | TRANSDERMAL | Status: DC
  Administered 2013-05-25: 21 mg via TRANSDERMAL
  Filled 2013-05-24: qty 1

## 2013-05-24 NOTE — ED Notes (Signed)
Bed: RESB Expected date:  Expected time:  Means of arrival:  Comments: EMS-Xanax O.D.

## 2013-05-24 NOTE — ED Notes (Addendum)
Pt reports that she has recently lost her mother and her sister and she was just upset. Pt denies trying to harm herself. Pt oriented x4, but is slow to answer and drowsy. Pt reports that she took approx 30 xanax and states that she drank 8 beers and 5 whiskey shots today

## 2013-05-24 NOTE — ED Notes (Signed)
Pt husband called pt room . Pt is talking to husband on the phone at this time. Pt is calm and cooperative at this time.

## 2013-05-24 NOTE — ED Notes (Signed)
Pt is doing the TTS at this time

## 2013-05-24 NOTE — ED Notes (Addendum)
Pt changed into blue scrubs, security wanded, pt clothes placed into locker 26 Pt has 1 pair brown sweatpants and 1 blue tshirt

## 2013-05-24 NOTE — ED Notes (Signed)
Pt from home via EMS, pt reports taking approx 30 xanax and drinking beer and whiskey today. Per EMS, pt states that "I failed my grandson." EMS reports that this was an intentional OD

## 2013-05-24 NOTE — ED Notes (Signed)
LCSWA consulted with Rankin,NP who will reassess patient once patient is alert and oriented. Disposition pending.   Janann Colonel. MSW, LCSW-A Triage Specialist   Phone: [phone] Fax: [phone]

## 2013-05-24 NOTE — ED Notes (Signed)
TTS being completed at this time. ?

## 2013-05-24 NOTE — ED Notes (Signed)
Pt ambulatory to bathroom with no assist-slow gait

## 2013-05-24 NOTE — BH Assessment (Signed)
Tele Assessment Note   Brenda Rice is an 57 y.o. female who presents to [facility] Emergency Department via EMS with the chief compliant of severe depression and intent to overdose by consuming 30 pills of Xanax which was reported to patient's RN by patient's husband. Patient states that she has been very depressed for the past couple of days due to relational stressors with her husband and financial stressors due to losing employment. Patient reports that she has worked at [employer] for 9 years and that now she is currently being terminated. Patient did not specify the causation of her termination with assessor. Patient was observed to be drowsy throughout assessment and required several prompts of redirection to answer questions posed. Patient reports "I'm just tired of it all!" as she reported that she has limited support at home and that her depressive symptoms have exacerbated due to constant expectations by her husband to "cook, clean, and pack his lunch". Patient reports excessive alcohol consumption evidenced by daily consumption of 8 beers with 5 shots of whiskey. Patient verbalized past history of suicidal ideations with previous attempts through overdose. Patient has a previous ED admission history of overdose on separate occurrences from January of 2013, and January, February, and August of 2014. On admission patient's BAL was 252. Patient continues to endorse suicidal ideations and is unable to contract for safety at this time. Patient denies HI/AVH as well.   Axis I: Major Depression, Recurrent severe and Alcohol Use Disorder Axis II: Deferred Axis III:  Past Medical History  Diagnosis Date  . Hypertension   . Depression   . Suicide attempt     x 3  . Osteoarthritis     right hip  . Fx clavicle     Hx of left clavicle fx  . Hx: UTI (urinary tract infection)    Axis IV: economic problems, occupational problems, other psychosocial or environmental problems, problems related to social  environment and problems with primary support group Axis V: 11-20 some danger of hurting self or others possible OR occasionally fails to maintain minimal personal hygiene OR gross impairment in communication  Past Medical History:  Past Medical History  Diagnosis Date  . Hypertension   . Depression   . Suicide attempt     x 3  . Osteoarthritis     right hip  . Fx clavicle     Hx of left clavicle fx  . Hx: UTI (urinary tract infection)     Past Surgical History  Procedure Laterality Date  . Orif left clavicle fracture and pinning 2nd metacarpal  01/03/2010    Family History: No family history on file.  Social History:  reports that she has been smoking Cigarettes.  She has a 120 pack-year smoking history. She has never used smokeless tobacco. She reports that she drinks about 7.8 ounces of alcohol per week. She reports that she does not use illicit drugs.  Additional Social History:  Alcohol / Drug Use History of alcohol / drug use?: Yes Substance #1 Name of Substance 1: ETOH 1 - Age of First Use: 14 1 - Amount (size/oz): 8 beers 1 - Frequency: daily 1 - Duration: years 1 - Last Use / Amount: 05/23/13- 8 beers and 5 whiskey shots  CIWA: CIWA-Ar BP: 110/77 mmHg Pulse Rate: 100 COWS:    Allergies:  Allergies  Allergen Reactions  . No Known Allergies     Home Medications:  (Not in a hospital admission)  OB/GYN Status:  No LMP recorded.  Patient is postmenopausal.  General Assessment Data Location of Assessment: [facility] Is this a Tele or Face-to-Face Assessment?: Tele Assessment Is this an Initial Assessment or a Re-assessment for this encounter?: Initial Assessment Living Arrangements: Spouse/significant other Can pt return to current living arrangement?: Yes Admission Status: Voluntary Is patient capable of signing voluntary admission?: Yes Transfer from: Home Referral Source: Self/Family/Friend     [facility] Crisis Care Plan Living  Arrangements: Spouse/significant other Name of Psychiatrist: Unknown Name of Therapist: None  Education Status Is patient currently in school?: No  Risk to self Suicidal Ideation: Yes-Currently Present Suicidal Intent: Yes-Currently Present Is patient at risk for suicide?: Yes Suicidal Plan?: Yes-Currently Present Specify Current Suicidal Plan: Plan to overdose on Xanax pills Access to Means: Yes Specify Access to Suicidal Means: Access to medications  What has been your use of drugs/alcohol within the last 12 months?: ETOH Previous Attempts/Gestures: Yes How many times?:  ("Several") Other Self Harm Risks: ETOH intoxication Triggers for Past Attempts: Unpredictable Intentional Self Injurious Behavior: None Family Suicide History: No Recent stressful life event(s): Job Loss (Patient reports that she recently lost her job from [employer]) Persecutory voices/beliefs?: No Depression: Yes Depression Symptoms: Isolating;Guilt;Loss of interest in usual pleasures;Feeling worthless/self pity Substance abuse history and/or treatment for substance abuse?: Yes Suicide prevention information given to non-admitted patients: Not applicable  Risk to Others Homicidal Ideation: No Thoughts of Harm to Others: No Current Homicidal Intent: No Current Homicidal Plan: No Access to Homicidal Means: No Identified Victim: None per patient History of harm to others?: No Assessment of Violence: None Noted Violent Behavior Description: None  Does patient have access to weapons?: No Criminal Charges Pending?: No Does patient have a court date: No  Psychosis Hallucinations: None noted Delusions: None noted  Mental Status Report Appear/Hygiene: Disheveled Eye Contact: Poor Motor Activity: Freedom of movement Speech: Slow;Slurred Level of Consciousness: Sedated (From alcohol intoxication) Mood: Depressed;Sad Affect: Blunted;Depressed Anxiety Level: None Thought Processes:  Coherent;Relevant Judgement: Impaired Orientation: Person;Place;Time;Situation Obsessive Compulsive Thoughts/Behaviors: None  Cognitive Functioning Concentration: Decreased Memory: Recent Intact;Remote Intact IQ: Average Insight: Poor Impulse Control: Poor Appetite: Good Weight Loss: 0 Weight Gain: 10 Sleep: No Change Total Hours of Sleep:  (Patient would not report ) Vegetative Symptoms: None  ADLScreening [facility]) Patient's cognitive ability adequate to safely complete daily activities?:  (Unable to assess) Patient able to express need for assistance with ADLs?: Yes Independently performs ADLs?:  (Unable to assess)  Prior Inpatient Therapy Prior Inpatient Therapy:  (Patient would not disclose) Prior Therapy Dates:  (Prior ED admissions for overdose)  Prior Outpatient Therapy Prior Outpatient Therapy:  (Unknown) Prior Therapy Dates:  (Unknown) Prior Therapy Facilty/Provider(s):  (Unknown)  ADL Screening (condition at time of admission) Patient's cognitive ability adequate to safely complete daily activities?:  (Unable to assess) Is the patient deaf or have difficulty hearing?: No Does the patient have difficulty seeing, even when wearing glasses/contacts?: No Does the patient have difficulty concentrating, remembering, or making decisions?:  (Unable to assess due to alcohol intoxication) Patient able to express need for assistance with ADLs?: Yes Does the patient have difficulty dressing or bathing?:  (Unable to assess) Independently performs ADLs?:  (Unable to assess) Does the patient have difficulty walking or climbing stairs?: No Weakness of Legs: None Weakness of Arms/Hands: None  Home Assistive Devices/Equipment Home Assistive Devices/Equipment:  (Unable to assess)  Therapy Consults (therapy consults require a physician order) PT Evaluation Needed: No OT Evalulation Needed: No SLP Evaluation Needed: No Abuse/Neglect Assessment (  Assessment to  be complete while patient is alone) Physical Abuse:  (Unable to assess) Verbal Abuse:  (Unable to assess) Sexual Abuse:  (Unable to assess) Exploitation of patient/patient's resources: Denies Self-Neglect: Denies Values / Beliefs Cultural Requests During Hospitalization: None Spiritual Requests During Hospitalization: None Consults Spiritual Care Consult Needed: No Social Work Consult Needed: No      Additional Information 1:1 In Past 12 Months?: No CIRT Risk: No Elopement Risk: No Does patient have medical clearance?: No     Disposition: Assessor recommends reassessment of patient by mid level or MD upon lowered BAL. Patient continues to endorse suicidal ideations with excessive alcohol consumption, subsequently endorsing the inability to contract for safety at this time.  Disposition Initial Assessment Completed for this Encounter: Yes Disposition of Patient: Inpatient treatment program Type of inpatient treatment program: Adult  Haskel Khan 05/24/2013 3:58 PM

## 2013-05-24 NOTE — ED Notes (Signed)
Updated pt's spouse at this time.

## 2013-05-24 NOTE — ED Provider Notes (Signed)
TIME SEEN: 2:58 PM  CHIEF COMPLAINT: Intentional overdose  HPI: Patient is a 57 year old female with a history of hypertension, depression, multiple prior suicide attempts who presents the emergency department after an intentional overdose on alcohol and Xanax.  Patient reports that she got a fight with her neighbor, husband, son and then drank multiple beers, multiple shots of whiskey and took approximately thirty 1mg  Xanax tablets earlier today. She denies any other coingestants. She is unable to tell what time she took his medications.  She denies this was an attempt to hurt herself but admits to suicidal thoughts over the past several weeks and prior history of suicide attempts by overdose.  ROS: See HPI Constitutional: no fever  Eyes: no drainage  ENT: no runny nose   Cardiovascular:  no chest pain  Resp: no SOB  GI: no vomiting GU: no dysuria Integumentary: no rash  Allergy: no hives  Musculoskeletal: no leg swelling  Neurological:  slurred speech ROS otherwise negative  PAST MEDICAL HISTORY/PAST SURGICAL HISTORY:  Past Medical History  Diagnosis Date  . Hypertension   . Depression   . Suicide attempt     x 3  . Osteoarthritis     right hip  . Fx clavicle     Hx of left clavicle fx  . Hx: UTI (urinary tract infection)     MEDICATIONS:  Prior to Admission medications   Medication Sig Start Date End Date Taking? Authorizing Provider  albuterol (PROVENTIL HFA;VENTOLIN HFA) 108 (90 BASE) MCG/ACT inhaler Inhale 2 puffs into the lungs every 6 (six) hours as needed for wheezing. 10/05/12   Elvina Sidle, MD  ALPRAZolam Prudy Feeler) 1 MG tablet Take 1 tablet (1 mg total) by mouth 2 (two) times daily as needed for sleep or anxiety. 10/05/12   Elvina Sidle, MD  Aspirin-Acetaminophen-Caffeine (GOODY HEADACHE PO) Take 1 packet by mouth every 8 (eight) hours as needed (pain).    Historical Provider, MD  citalopram (CELEXA) 20 MG tablet Take 1 tablet (20 mg total) by mouth daily. 07/01/12  07/01/13  Phillips Odor, MD  lisinopril (PRINIVIL,ZESTRIL) 40 MG tablet Take 1 tablet (40 mg total) by mouth daily. 10/05/12   Elvina Sidle, MD  metoprolol succinate (TOPROL-XL) 50 MG 24 hr tablet Take 1 tablet (50 mg total) by mouth daily. Take with meal. Must have office visit for further refills. 10/05/12   Elvina Sidle, MD  tetrahydrozoline-zinc (VISINE-AC) 0.05-0.25 % ophthalmic solution Place 2 drops into both eyes 3 (three) times daily as needed (dry eyes).    Historical Provider, MD    ALLERGIES:  Allergies  Allergen Reactions  . No Known Allergies     SOCIAL HISTORY:  History  Substance Use Topics  . Smoking status: Current Every Day Smoker -- 2.00 packs/day for 40 years    Types: Cigarettes  . Smokeless tobacco: Never Used  . Alcohol Use: 2.4 oz/week    3 Glasses of wine, 1 Cans of beer per week     Comment: drinks daily    FAMILY HISTORY: No family history on file.  EXAM: BP 110/77  Pulse 88  Temp(Src) 97.5 F (36.4 C) (Oral)  Resp 20  SpO2 100% CONSTITUTIONAL: Alert and oriented and responds appropriately to questions. Well-appearing; well-nourished; intoxicated, smells of alcohol HEAD: Normocephalic EYES: Conjunctivae clear, PERRL ENT: normal nose; no rhinorrhea; moist mucous membranes; pharynx without lesions noted NECK: Supple, no meningismus, no LAD  CARD: RRR; S1 and S2 appreciated; no murmurs, no clicks, no rubs, no gallops RESP: Normal chest  excursion without splinting or tachypnea; breath sounds clear and equal bilaterally; no wheezes, no rhonchi, no rales,  ABD/GI: Normal bowel sounds; non-distended; soft, non-tender, no rebound, no guarding BACK:  The back appears normal and is non-tender to palpation, there is no CVA tenderness EXT: Normal ROM in all joints; non-tender to palpation; no edema; normal capillary refill; no cyanosis    SKIN: Normal color for age and race; warm NEURO: Moves all extremities equally; mild slurred speech due to  intoxication; no facial droop PSYCH: The patient's mood and manner are appropriate. Grooming and personal hygiene are appropriate.  MEDICAL DECISION MAKING: Patient here after an intentional overdose. She has mild hypoxia initially when sleeping. She is now hemodynamically stable, oxygen saturation 100% on 2 L nasal cannula. No signs of trauma on exam. No current medical complaints. Will obtain labs, drug screen, continue to monitor closely. Will discuss with behavioral health.  Will IVC pt as I feel she is a danger to herself.  ED PROGRESS: Labs shows mild hyponatremia and hypochloremia.  Likely from dehydration from alcohol use.  Pt is receiving IVF.  ETOH is 252.  Urine pending.  Pt stable.  Will continue to monitor.  4:45 PM  Drug screen is positive for benzodiazepines. Patient is still stable, sleeping. We'll continue to closely monitor.  7:30 PM  Pt more awake.  Vitals stable.  Able to tolerate po.  Medically clear.  8:30 PM  Pt satting 95% on RA.  Sleeping comfortably.  Behavioral health has seen patient when she was intoxicated and she was endorsing suicidal ideation and was not able to contract for safety. They plan on reassessing patient when she is more sober.  11:30 PM  Drenda Freeze, NP has reassessed pt.  Awaiting [facility] plan.   EKG Interpretation    Date/Time:  Sunday May 24 2013 14:39:55 EST Ventricular Rate:  87 PR Interval:  152 QRS Duration: 78 QT Interval:  369 QTC Calculation: 444 R Axis:   83 Text Interpretation:  Sinus rhythm Probable anteroseptal infarct, old ST elevation consistent with J point elevation Unchanged compared to ECG Nov 22 2012 Confirmed by WARD  DO, KRISTEN [phone]) on 05/24/2013 3:06:28 PM             Layla Maw Ward, DO 05/24/13 2330

## 2013-05-24 NOTE — Consult Note (Signed)
[facility] Face-to-Face Psychiatry Consult   Reason for Consult:  Referral for evaluation for inpatient psychiatric treatment Referring Physician:  EDP Ward Brenda Rice is an 57 y.o. female.  Assessment: AXIS I:  Alcohol Abuse and Major Depression, Recurrent severe AXIS II:  Deferred AXIS III:   Past Medical History  Diagnosis Date  . Hypertension   . Depression   . Suicide attempt     x 3  . Osteoarthritis     right hip  . Fx clavicle     Hx of left clavicle fx  . Hx: UTI (urinary tract infection)    AXIS IV:  economic problems, occupational problems, other psychosocial or environmental problems and problems related to social environment AXIS V:  21-30 behavior considerably influenced by delusions or hallucinations OR serious impairment in judgment, communication OR inability to function in almost all areas  Plan:  Patient to remain in ED overnight. Re-evaluate by psychiatrist in the morning.   Subjective:   Brenda Rice is a 57 y.o. female patient admitted with intentional ingestion of Xanax and alcohol this evening. Patient currently under IVC taken out by the EDP Ward. Patient states that she took an "unknown" amount of Xanax (states she dumped some pills into her hand and did not count them) today because she wanted "to get some rest," "to get error off my mind," and "to go to sleep." When asked what she was referring to when she stated she wanted "to go to sleep," she answered that she wanted to rest and was not trying to hurt herself. Patient continues to deny SI and HI at this time. Patient states that she is having "financial problems and husband is not working". 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 States that she called her EAP program at work and was given a list of psychiatrists for her to choose from. States that is as far as she has gotten in the process. Patient  acknowledges that she needs to talk to someone about her stressors. In addition to financial difficulties, patient also reports having conflicts with her neighbors. Patient admits to drinking 3-4 beers today and states that she drinks beer daily and has done so since the age of 89. States the longest she has gone without using alcohol has been 3 days.Patient states that she does not sleep well and sleeps 3-4 hours per night because "I can't shut my mind off." States she has to take OTC [employer] 50 mg sleep aid and has been taking this nightly for 8-9 years.  States she has a Veterinary surgeon, Dr. Dallas Rice, on [address] but "it has been a while" since she has seen this provider. Patient states that people have told her she needs help with her drinking and she feels that she needs help as well. States "I want help but I don't want inpatient." States that she is fearful of losing her job at [employer] if she does inpatient because "it is the end of the year and it is a really busy time for [place]" and "when I did inpatient 10 years ago at [facility], it didn't do nothing for me."    Past Psychiatric History: Past Medical History  Diagnosis Date  . Hypertension   . Depression   . Suicide attempt     x 3  . Osteoarthritis     right hip  . Fx clavicle  Hx of left clavicle fx  . Hx: UTI (urinary tract infection)     reports that she has been smoking Cigarettes.  She has a 120 pack-year smoking history. She has never used smokeless tobacco. She reports that she drinks about 7.8 ounces of alcohol per week. She reports that she does not use illicit drugs. No family history on file. Family History Substance Abuse:  (Unable to assess) Family Supports: No Living Arrangements: Spouse/significant other Can pt return to current living arrangement?: Yes Abuse/Neglect [facility] Physical Abuse:  (Unable to assess) Verbal Abuse:  (Unable to assess) Sexual Abuse:  (Unable to assess) Allergies:  No Known Allergies  ACT  Assessment Complete:  Yes:    Educational Status    Risk to Self: Risk to self Suicidal Ideation: Yes-Currently Present Suicidal Intent: Yes-Currently Present Is patient at risk for suicide?: Yes Suicidal Plan?: Yes-Currently Present Specify Current Suicidal Plan: Plan to overdose on Xanax pills Access to Means: Yes Specify Access to Suicidal Means: Access to medications  What has been your use of drugs/alcohol within the last 12 months?: ETOH Previous Attempts/Gestures: Yes How many times?:  ("Several") Other Self Harm Risks: ETOH intoxication Triggers for Past Attempts: Unpredictable Intentional Self Injurious Behavior: None Family Suicide History: No Recent stressful life event(s): Job Loss (Patient reports that she recently lost her job from [employer]) Persecutory voices/beliefs?: No Depression: Yes Depression Symptoms: Isolating;Guilt;Loss of interest in usual pleasures;Feeling worthless/self pity Substance abuse history and/or treatment for substance abuse?: Yes Suicide prevention information given to non-admitted patients: Not applicable  Risk to Others: Risk to Others Homicidal Ideation: No Thoughts of Harm to Others: No Current Homicidal Intent: No Current Homicidal Plan: No Access to Homicidal Means: No Identified Victim: None per patient History of harm to others?: No Assessment of Violence: None Noted Violent Behavior Description: None  Does patient have access to weapons?: No Criminal Charges Pending?: No Does patient have a court date: No  Abuse: Abuse/Neglect Assessment (Assessment to be complete while patient is alone) Physical Abuse:  (Unable to assess) Verbal Abuse:  (Unable to assess) Sexual Abuse:  (Unable to assess) Exploitation of patient/patient's resources: Denies Self-Neglect: Denies  Prior Inpatient Therapy: Prior Inpatient Therapy Prior Inpatient Therapy:  (Patient would not disclose) Prior Therapy Dates:  (Prior ED admissions for overdose)  Prior  Outpatient Therapy: Prior Outpatient Therapy Prior Outpatient Therapy:  (Unknown) Prior Therapy Dates:  (Unknown) Prior Therapy Facilty/Provider(s):  (Unknown)  Additional Information: Additional Information 1:1 In Past 12 Months?: No CIRT Risk: No Elopement Risk: No Does patient have medical clearance?: No                  Objective: Blood pressure 181/102, pulse 99, temperature 98 F (36.7 C), temperature source Oral, resp. rate 14, SpO2 95.00%.There is no weight on file to calculate BMI. Results for orders placed during the hospital encounter of 05/24/13 (from the past 72 hour(s))  GLUCOSE, CAPILLARY     Status: Abnormal   Collection Time    05/24/13  2:52 PM      Result Value Range   Glucose-Capillary 66 (*) 70 - 99 mg/dL  CBC     Status: Abnormal   Collection Time    05/24/13  3:04 PM      Result Value Range   WBC 7.3  4.0 - 10.5 K/uL   RBC 4.12  3.87 - 5.11 MIL/uL   Hemoglobin 14.1  12.0 - 15.0 g/dL   HCT 29.5  62.1 - 30.8 %  MCV 97.3  78.0 - 100.0 fL   MCH 34.2 (*) 26.0 - 34.0 pg   MCHC 35.2  30.0 - 36.0 g/dL   RDW 96.0  45.4 - 09.8 %   Platelets 136 (*) 150 - 400 K/uL  COMPREHENSIVE METABOLIC PANEL     Status: Abnormal   Collection Time    05/24/13  3:04 PM      Result Value Range   Sodium 130 (*) 135 - 145 mEq/L   Potassium 4.5  3.5 - 5.1 mEq/L   Chloride 92 (*) 96 - 112 mEq/L   CO2 24  19 - 32 mEq/L   Glucose, Bld 79  70 - 99 mg/dL   BUN 7  6 - 23 mg/dL   Creatinine, Ser 1.19  0.50 - 1.10 mg/dL   Calcium 8.9  8.4 - 14.7 mg/dL   Total Protein 7.3  6.0 - 8.3 g/dL   Albumin 3.9  3.5 - 5.2 g/dL   AST 31  0 - 37 U/L   ALT 25  0 - 35 U/L   Alkaline Phosphatase 61  39 - 117 U/L   Total Bilirubin 0.3  0.3 - 1.2 mg/dL   GFR calc non Af Amer >90  >90 mL/min   GFR calc Af Amer >90  >90 mL/min   Comment: (NOTE)     The eGFR has been calculated using the CKD EPI equation.     This calculation has not been validated in all clinical situations.      eGFR's persistently <90 mL/min signify possible Chronic Kidney     Disease.  ETHANOL     Status: Abnormal   Collection Time    05/24/13  3:04 PM      Result Value Range   Alcohol, Ethyl (B) 252 (*) 0 - 11 mg/dL   Comment:            LOWEST DETECTABLE LIMIT FOR     SERUM ALCOHOL IS 11 mg/dL     FOR MEDICAL PURPOSES ONLY  ACETAMINOPHEN LEVEL     Status: None   Collection Time    05/24/13  3:04 PM      Result Value Range   Acetaminophen (Tylenol), Serum <15.0  10 - 30 ug/mL   Comment:            THERAPEUTIC CONCENTRATIONS VARY     SIGNIFICANTLY. A RANGE OF 10-30     ug/mL MAY BE AN EFFECTIVE     CONCENTRATION FOR MANY PATIENTS.     HOWEVER, SOME ARE BEST TREATED     AT CONCENTRATIONS OUTSIDE THIS     RANGE.     ACETAMINOPHEN CONCENTRATIONS     >150 ug/mL AT 4 HOURS AFTER     INGESTION AND >50 ug/mL AT 12     HOURS AFTER INGESTION ARE     OFTEN ASSOCIATED WITH TOXIC     REACTIONS.  SALICYLATE LEVEL     Status: Abnormal   Collection Time    05/24/13  3:04 PM      Result Value Range   Salicylate Lvl <2.0 (*) 2.8 - 20.0 mg/dL  URINE RAPID DRUG SCREEN (HOSP PERFORMED)     Status: Abnormal   Collection Time    05/24/13  4:01 PM      Result Value Range   Opiates NONE DETECTED  NONE DETECTED   Cocaine NONE DETECTED  NONE DETECTED   Benzodiazepines POSITIVE (*) NONE DETECTED   Amphetamines NONE DETECTED  NONE DETECTED  Tetrahydrocannabinol NONE DETECTED  NONE DETECTED   Barbiturates NONE DETECTED  NONE DETECTED   Comment:            DRUG SCREEN FOR MEDICAL PURPOSES     ONLY.  IF CONFIRMATION IS NEEDED     FOR ANY PURPOSE, NOTIFY LAB     WITHIN 5 DAYS.                LOWEST DETECTABLE LIMITS     FOR URINE DRUG SCREEN     Drug Class       Cutoff (ng/mL)     Amphetamine      1000     Barbiturate      200     Benzodiazepine   200     Tricyclics       300     Opiates          300     Cocaine          300     THC              50   Labs are reviewed and are pertinent for  Na+ 130; UDS positive for benzodiazepines, ETOH 252 and glucose 66.  Current Facility-Administered Medications  Medication Dose Route Frequency Provider Last Rate Last Dose  . 0.9 %  sodium chloride infusion   Intravenous Continuous Kristen N Ward, DO 125 mL/hr at 05/24/13 1521    . acetaminophen (TYLENOL) tablet 650 mg  650 mg Oral Q4H PRN Kristen N Ward, DO      . alum & mag hydroxide-simeth (MAALOX/MYLANTA) 200-200-20 MG/5ML suspension 30 mL  30 mL Oral PRN Kristen N Ward, DO      . ibuprofen (ADVIL,MOTRIN) tablet 600 mg  600 mg Oral Q8H PRN Kristen N Ward, DO      . LORazepam (ATIVAN) tablet 1 mg  1 mg Oral Q6H PRN Kristen N Ward, DO      . nicotine (NICODERM CQ - dosed in mg/24 hours) patch 21 mg  21 mg Transdermal Once [employer], DO   21 mg at 05/24/13 1520  . nicotine (NICODERM CQ - dosed in mg/24 hours) patch 21 mg  21 mg Transdermal Daily Kristen N Ward, DO      . ondansetron (ZOFRAN) tablet 4 mg  4 mg Oral Q8H PRN Kristen N Ward, DO      . zolpidem (AMBIEN) tablet 5 mg  5 mg Oral QHS PRN Layla Maw Ward, DO       Current Outpatient Prescriptions  Medication Sig Dispense Refill  . albuterol (PROVENTIL HFA;VENTOLIN HFA) 108 (90 BASE) MCG/ACT inhaler Inhale 2 puffs into the lungs every 6 (six) hours as needed for wheezing.  3 Inhaler  3  . ALPRAZolam (XANAX) 1 MG tablet Take 1 tablet (1 mg total) by mouth 2 (two) times daily as needed for sleep or anxiety.  180 tablet  1  . chlorpheniramine-pseudoephedrine-acetaminophen (SINUTAB) 2-30-500 MG per tablet Take 1 tablet by mouth every 4 (four) hours as needed for allergies or congestion.      Marland Kitchen lisinopril (PRINIVIL,ZESTRIL) 40 MG tablet Take 40 mg by mouth every morning.        Psychiatric Specialty Exam:     Blood pressure 181/102, pulse 99, temperature 98 F (36.7 C), temperature source Oral, resp. rate 14, SpO2 95.00%.There is no weight on file to calculate BMI.  General Appearance: Fairly Groomed  Patent attorney::  Good  Speech:   Clear and  Coherent  Volume:  Normal  Mood:  Depressed  Affect:  Congruent  Thought Process:  Coherent  Orientation:  Other:  Oriented to person and place. Time orientation off by 3 days.  Thought Content:  NA  Suicidal Thoughts:  No  Homicidal Thoughts:  No  Memory:  Immediate;   Fair Recent;   Poor Remote;   Poor  Judgement:  Impaired  Insight:  Fair  Psychomotor Activity:  Decreased  Concentration:  Fair  Recall:  Fair  Akathisia:  No  Handed:  Right  AIMS (if indicated):     Assets:  Desire for Improvement Financial Resources/Insurance  Sleep:      Treatment Plan Summary: Patient to remain in ED overnight. Patient to be evaluated by psychiatrist in the morning.   Alberteen Sam, FNP-BC 05/24/2013 10:48 PM Agree with assessment and plan Reymundo Poll. Dub Mikes, M.D.

## 2013-05-24 NOTE — ED Notes (Signed)
Poison control notified of pt ingestion and status. Revonda Standard at [facility] stated to monitor pt for 6 hrs and she will call back.

## 2013-05-25 ENCOUNTER — Inpatient Hospital Stay (HOSPITAL_COMMUNITY)
Admission: AD | Admit: 2013-05-25 | Discharge: 2013-05-29 | DRG: 897 | Disposition: A | Discharge status: Home / Self Care | Payer: Managed Care, Other (non HMO) | Source: Intra-hospital | Attending: Psychiatry | Admitting: Psychiatry

## 2013-05-25 ENCOUNTER — Encounter (HOSPITAL_COMMUNITY): Payer: Self-pay | Admitting: *Deleted

## 2013-05-25 DIAGNOSIS — T6592XA Toxic effect of unspecified substance, intentional self-harm, initial encounter: Secondary | ICD-10-CM

## 2013-05-25 DIAGNOSIS — F32A Depression, unspecified: Secondary | ICD-10-CM | POA: Diagnosis present

## 2013-05-25 DIAGNOSIS — T43502A Poisoning by unspecified antipsychotics and neuroleptics, intentional self-harm, initial encounter: Secondary | ICD-10-CM

## 2013-05-25 DIAGNOSIS — F1320 Sedative, hypnotic or anxiolytic dependence, uncomplicated: Principal | ICD-10-CM | POA: Diagnosis present

## 2013-05-25 DIAGNOSIS — F339 Major depressive disorder, recurrent, unspecified: Secondary | ICD-10-CM | POA: Diagnosis present

## 2013-05-25 DIAGNOSIS — T5191XA Toxic effect of unspecified alcohol, accidental (unintentional), initial encounter: Secondary | ICD-10-CM

## 2013-05-25 DIAGNOSIS — J441 Chronic obstructive pulmonary disease with (acute) exacerbation: Secondary | ICD-10-CM

## 2013-05-25 DIAGNOSIS — Z79899 Other long term (current) drug therapy: Secondary | ICD-10-CM

## 2013-05-25 DIAGNOSIS — F411 Generalized anxiety disorder: Secondary | ICD-10-CM | POA: Diagnosis present

## 2013-05-25 DIAGNOSIS — F329 Major depressive disorder, single episode, unspecified: Secondary | ICD-10-CM | POA: Diagnosis present

## 2013-05-25 DIAGNOSIS — T424X4A Poisoning by benzodiazepines, undetermined, initial encounter: Secondary | ICD-10-CM

## 2013-05-25 DIAGNOSIS — I10 Essential (primary) hypertension: Secondary | ICD-10-CM | POA: Diagnosis present

## 2013-05-25 DIAGNOSIS — F1020 Alcohol dependence, uncomplicated: Secondary | ICD-10-CM | POA: Diagnosis present

## 2013-05-25 MED ORDER — HYDROXYZINE HCL 25 MG PO TABS: 25.0000 mg | ORAL_TABLET | Freq: Four times a day (QID) | ORAL | Status: DC | PRN

## 2013-05-25 MED ORDER — ADULT MULTIVITAMIN W/MINERALS CH
1.0000 | ORAL_TABLET | Freq: Every day | ORAL | Status: DC
  Administered 2013-05-26 – 2013-05-29 (×4): 1 via ORAL
  Filled 2013-05-25 (×8): qty 1

## 2013-05-25 MED ORDER — VITAMIN B-1 100 MG PO TABS
100.0000 mg | ORAL_TABLET | Freq: Every day | ORAL | Status: DC
  Administered 2013-05-26 – 2013-05-29 (×4): 100 mg via ORAL
  Filled 2013-05-25 (×7): qty 1

## 2013-05-25 MED ORDER — CHLORDIAZEPOXIDE HCL 25 MG PO CAPS
25.0000 mg | ORAL_CAPSULE | Freq: Every day | ORAL | Status: AC
  Administered 2013-05-29: 25 mg via ORAL
  Filled 2013-05-25: qty 1

## 2013-05-25 MED ORDER — ALUM & MAG HYDROXIDE-SIMETH 200-200-20 MG/5ML PO SUSP: 30.0000 mL | ORAL | Status: DC | PRN

## 2013-05-25 MED ORDER — ALBUTEROL SULFATE HFA 108 (90 BASE) MCG/ACT IN AERS: 2.0000 | INHALATION_SPRAY | Freq: Four times a day (QID) | RESPIRATORY_TRACT | Status: DC | PRN

## 2013-05-25 MED ORDER — ACETAMINOPHEN 325 MG PO TABS: 650.0000 mg | ORAL_TABLET | Freq: Four times a day (QID) | ORAL | Status: DC | PRN

## 2013-05-25 MED ORDER — CHLORDIAZEPOXIDE HCL 25 MG PO CAPS: 25.0000 mg | ORAL_CAPSULE | Freq: Four times a day (QID) | ORAL | Status: AC | PRN

## 2013-05-25 MED ORDER — TRAZODONE HCL 50 MG PO TABS
50.0000 mg | ORAL_TABLET | Freq: Every evening | ORAL | Status: DC | PRN
  Administered 2013-05-25 – 2013-05-28 (×3): 50 mg via ORAL
  Filled 2013-05-25 (×2): qty 1
  Filled 2013-05-25: qty 7
  Filled 2013-05-25: qty 1

## 2013-05-25 MED ORDER — ADULT MULTIVITAMIN W/MINERALS CH
1.0000 | ORAL_TABLET | Freq: Every day | ORAL | Status: DC
  Administered 2013-05-25: 1 via ORAL
  Filled 2013-05-25: qty 1

## 2013-05-25 MED ORDER — HYDROXYZINE HCL 25 MG PO TABS: 25.0000 mg | ORAL_TABLET | Freq: Four times a day (QID) | ORAL | Status: AC | PRN

## 2013-05-25 MED ORDER — MAGNESIUM HYDROXIDE 400 MG/5ML PO SUSP: 30.0000 mL | Freq: Every day | ORAL | Status: DC | PRN

## 2013-05-25 MED ORDER — CHLORDIAZEPOXIDE HCL 25 MG PO CAPS
25.0000 mg | ORAL_CAPSULE | ORAL | Status: AC
  Administered 2013-05-28 (×2): 25 mg via ORAL
  Filled 2013-05-25 (×2): qty 1

## 2013-05-25 MED ORDER — CHLORDIAZEPOXIDE HCL 25 MG PO CAPS: 25.0000 mg | ORAL_CAPSULE | Freq: Four times a day (QID) | ORAL | Status: DC | PRN

## 2013-05-25 MED ORDER — CLONIDINE HCL 0.1 MG PO TABS
0.1000 mg | ORAL_TABLET | Freq: Once | ORAL | Status: AC
  Administered 2013-05-25: 0.1 mg via ORAL
  Filled 2013-05-25: qty 1

## 2013-05-25 MED ORDER — LISINOPRIL 20 MG PO TABS
40.0000 mg | ORAL_TABLET | Freq: Every morning | ORAL | Status: DC
  Administered 2013-05-26 – 2013-05-29 (×4): 40 mg via ORAL
  Filled 2013-05-25 (×7): qty 2

## 2013-05-25 MED ORDER — CHLORDIAZEPOXIDE HCL 25 MG PO CAPS
25.0000 mg | ORAL_CAPSULE | Freq: Four times a day (QID) | ORAL | Status: AC
  Administered 2013-05-25 – 2013-05-26 (×6): 25 mg via ORAL
  Filled 2013-05-25 (×6): qty 1

## 2013-05-25 MED ORDER — LOPERAMIDE HCL 2 MG PO CAPS: 2.0000 mg | ORAL_CAPSULE | ORAL | Status: AC | PRN

## 2013-05-25 MED ORDER — THIAMINE HCL 100 MG/ML IJ SOLN: 100.0000 mg | Freq: Once | INTRAMUSCULAR | Status: DC

## 2013-05-25 MED ORDER — TRAZODONE HCL 50 MG PO TABS: 50.0000 mg | ORAL_TABLET | Freq: Every evening | ORAL | Status: DC | PRN

## 2013-05-25 MED ORDER — ONDANSETRON 4 MG PO TBDP: 4.0000 mg | ORAL_TABLET | Freq: Four times a day (QID) | ORAL | Status: AC | PRN

## 2013-05-25 MED ORDER — LOPERAMIDE HCL 2 MG PO CAPS: 2.0000 mg | ORAL_CAPSULE | ORAL | Status: DC | PRN

## 2013-05-25 MED ORDER — CHLORDIAZEPOXIDE HCL 25 MG PO CAPS
25.0000 mg | ORAL_CAPSULE | Freq: Three times a day (TID) | ORAL | Status: AC
  Administered 2013-05-27 (×3): 25 mg via ORAL
  Filled 2013-05-25 (×3): qty 1

## 2013-05-25 MED ORDER — VITAMIN B-1 100 MG PO TABS
100.0000 mg | ORAL_TABLET | Freq: Every day | ORAL | Status: DC
  Administered 2013-05-25: 13:00:00 100 mg via ORAL
  Filled 2013-05-25: qty 1

## 2013-05-25 MED ORDER — ACETAMINOPHEN 325 MG PO TABS
650.0000 mg | ORAL_TABLET | Freq: Four times a day (QID) | ORAL | Status: DC | PRN
  Administered 2013-05-25 – 2013-05-28 (×3): 650 mg via ORAL
  Filled 2013-05-25 (×2): qty 2

## 2013-05-25 MED ORDER — ONDANSETRON 4 MG PO TBDP: 4.0000 mg | ORAL_TABLET | Freq: Four times a day (QID) | ORAL | Status: DC | PRN

## 2013-05-25 MED ORDER — NICOTINE 21 MG/24HR TD PT24
21.0000 mg | MEDICATED_PATCH | Freq: Every day | TRANSDERMAL | Status: DC
  Administered 2013-05-26 – 2013-05-29 (×4): 21 mg via TRANSDERMAL
  Filled 2013-05-25 (×6): qty 1

## 2013-05-25 MED ORDER — CHLORDIAZEPOXIDE HCL 25 MG PO CAPS: 25.0000 mg | ORAL_CAPSULE | Freq: Once | ORAL | Status: AC

## 2013-05-25 NOTE — Progress Notes (Signed)
Patient has been accepted to Bed 301-1 at [facility].  IVC has been faxed to [facility].  Writer informed the nurse and the ER MD (Dr. Adriana Simas).

## 2013-05-25 NOTE — Tx Team (Signed)
Initial Interdisciplinary Treatment Plan  PATIENT STRENGTHS: (choose at least two) Ability for insight Average or above average intelligence Communication skills Work skills  PATIENT STRESSORS: Financial difficulties Loss of marital problems Marital or family conflict Substance abuse   PROBLEM LIST: Problem List/Patient Goals Date to be addressed Date deferred Reason deferred Estimated date of resolution  Substance abuse 05/25/2013     depression 05/25/2013                                                 DISCHARGE CRITERIA:  Ability to meet basic life and health needs Improved stabilization in mood, thinking, and/or behavior Verbal commitment to aftercare and medication compliance Withdrawal symptoms are absent or subacute and managed without 24-hour nursing intervention  PRELIMINARY DISCHARGE PLAN: Outpatient therapy Return to previous living arrangement Return to previous work or school arrangements  PATIENT/FAMIILY INVOLVEMENT: This treatment plan has been presented to and reviewed with the patient, Brenda Rice, and/or family member, .  The patient and family have been given the opportunity to ask questions and make suggestions.  Brenda Rice 05/25/2013, 5:36 PM

## 2013-05-25 NOTE — Progress Notes (Deleted)
Adult Psychoeducational Group Note  Date:  05/25/2013 Time:  6:51 PM  Group Topic/Focus:  Wellness Toolbox:   The focus of this group is to discuss various aspects of wellness, balancing those aspects and exploring ways to increase the ability to experience wellness.  Patients will create a wellness toolbox for use upon discharge.  Participation Level:  Did Not Attend   Additional Comments:  Pt did not attend group despite being encouraged by staff to do so.  Reinaldo Raddle K 05/25/2013, 6:51 PM

## 2013-05-25 NOTE — Progress Notes (Signed)
D:Pt is appropriate in affect and mood. She reports a plan to leave her husband and move into her own apartment. She reports being financially stable enough to make the move. She is ready for a positive change in her life. She reports having a strong support system with family, friends, AA members, and church. She presents with no concerns she wishes for this writer to address at this time. She is denying any withdrawal symptoms at this time.  A: Writer administered scheduled and prn medications to pt. Continued support and availability as needed was extended to this pt. Staff continue to monitor pt with q86min checks.  R: No adverse drug reactions noted. Pt receptive to treatment. Pt remains safe at this time.

## 2013-05-25 NOTE — Progress Notes (Signed)
Writer consulted with the NP Julieanne Cotton) regarding the patient meeting criteria for inpatient hospitalization.  Writer informed the [facility] Are[facility]) and referred the placement for placement on the [address].   Writer faxed the patient to [facility] for placement.

## 2013-05-25 NOTE — Progress Notes (Signed)
Pt was interviewed with NP. Note reviewed. Agree with above assessment and plan.

## 2013-05-25 NOTE — Progress Notes (Signed)
Patient ID: Brenda Rice, female   DOB: 12-Sep-1955, 57 y.o.   MRN: [id number] Saw patient with Dr Tawni Carnes this am.  It is of note that she was here back in August this year with the same exact scenerio.  At that time she was discharged home to move to [place] to stay with his sister.  Then it was also agreed upon discharge that she will move her job to [place].  Today, she is tearful and told this Clinical research associate and DR Tawni Carnes that moving to [place] did not work out.  She ended up moving back with her husband who has alcohol issues.  Patient cried through out the interview period.  She took 30 tablets of 1 mg  (30 mg po) of Xanax with some alcohol yesterday.  Her husband IVC her and he was the one that called EMS to bring her to the hospital.  Patient states she did not take the medications to hurt or kill herself but to sleep.  Her stressors includes pending divorce from her husband, about to lose her job and  relapse on alcohol.  She was taking Prozac for depression a while ago and stopped taking it.  Patient reports daily drinking of 1-2 beer before dinner.  She reports racing thoughts " I cannot turn off my brain off, I keep thinking about my job and other stuff".  Patient reports sleeping only 3-4 hours at night but reports her appetite is good.  At this time she feels hopeless, helpless and worthless but is requesting to be discharged home.  Patient states she is going to move in with her son this time in [place].  Patient states she does not want want to lose her job if she comes in inpatient.  She denies SI/HI/AVH however, we will admit her to our inpatient Chemical dependency unit for alcohol and Xanax detox.  We will use our Librium protocol.  We will also start her on antidepressant when she is sober and at discharge will refer her to outpatient rehabilitation facility. Psychiatric Specialty Exam: Physical Exam  ROS  Blood pressure 167/113, pulse 108, temperature 98.6 F (37 C), temperature source Oral, resp. rate 20, SpO2  98.00%.There is no weight on file to calculate BMI.  General Appearance: Disheveled  Eye Contact::  Poor  Speech:  Clear and Coherent and Normal Rate  Volume:  Normal  Mood:  Angry, Anxious, Depressed, Hopeless and Worthless  Affect:  Congruent, Depressed, Flat and Tearful  Thought Process:  Coherent  Orientation:  Full (Time, Place, and Person)  Thought Content:  NA  Suicidal Thoughts:  No  Homicidal Thoughts:  No  Memory:  Immediate;   Good Recent;   Good Remote;   Good  Judgement:  Poor  Insight:  Shallow  Psychomotor Activity:  Normal  Concentration:  Fair  Recall:  NA  Akathisia:  NA  Handed:  Right  AIMS (if indicated):     Assets:  Desire for Improvement  Sleep:        Plan: Admit to our [address] hall unit for safety Start detox with Librium protocol. Dahlia Byes   PMHNP-BC

## 2013-05-25 NOTE — [facility] Group Notes (Signed)
[facility] LCSW Group Therapy  05/25/2013 2:35 PM  Type of Therapy:  Group Therapy  Participation Level:  Did Not Attend (pt just arrived on unit)   Smart, Brenda Rice LCSWA  05/25/2013, 2:35 PM

## 2013-05-25 NOTE — Progress Notes (Signed)
57 year old female  Involuntary admitted for substance abuse and SI attempt.  She overdosed last night on 30 xanax es, that were her husbands,  and drank . She found out that her husband had been cheating on her with their neighbor. UDS positive benzo's and alcohol . She is concerned about loosing her job.  H/o HTN  NKDA.  She has  Been cooperative and pleasant.

## 2013-05-26 ENCOUNTER — Encounter (HOSPITAL_COMMUNITY): Payer: Self-pay | Admitting: Psychiatry

## 2013-05-26 DIAGNOSIS — F1994 Other psychoactive substance use, unspecified with psychoactive substance-induced mood disorder: Secondary | ICD-10-CM

## 2013-05-26 DIAGNOSIS — F321 Major depressive disorder, single episode, moderate: Secondary | ICD-10-CM

## 2013-05-26 DIAGNOSIS — F1020 Alcohol dependence, uncomplicated: Secondary | ICD-10-CM

## 2013-05-26 DIAGNOSIS — F1320 Sedative, hypnotic or anxiolytic dependence, uncomplicated: Principal | ICD-10-CM

## 2013-05-26 DIAGNOSIS — F411 Generalized anxiety disorder: Secondary | ICD-10-CM | POA: Diagnosis present

## 2013-05-26 DIAGNOSIS — F329 Major depressive disorder, single episode, unspecified: Secondary | ICD-10-CM | POA: Diagnosis present

## 2013-05-26 MED ORDER — GUAIFENESIN 100 MG/5ML PO SYRP
200.0000 mg | ORAL_SOLUTION | ORAL | Status: DC | PRN
  Administered 2013-05-26 – 2013-05-28 (×4): 200 mg via ORAL
  Filled 2013-05-26: qty 10

## 2013-05-26 NOTE — Progress Notes (Signed)
Date: 05/26/2013  Time: 11:38 AM  Group Topic/Focus:  Recovery Goals: The focus of this group is to identify appropriate goals for recovery and establish a plan to achieve them.  Participation Level: Active  Participation Quality: Appropriate, Sharing and Supportive  Affect: Appropriate  Cognitive: Appropriate  Insight: Appropriate  Engagement in Group: Engaged and Supportive  Modes of Intervention: Discussion, Education and Support  Additional Comments: Pt attended group.  Bronwen Pendergraft M  05/26/2013, 11:38 AM  
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
## 2013-05-26 NOTE — Progress Notes (Signed)
Patient ID: Brenda Rice, female   DOB: [birth year], 57 y.o.   MRN: [id number]  D: Patient pleasant and cooperative with staff, interacting well with peers on unit. Pt rates her depression at a three. A: Medication management, encourage staff/peer interaction and group participation. R: Patient complaint with medications, but did not attend morning group session.

## 2013-05-26 NOTE — [facility] Group Notes (Signed)
[facility] Group Notes:  (Nursing/MHT/Case Management/Adjunct)  Date:  05/26/2013  Time:  9:25 AM  Type of Therapy:  did not attend  Participation Level:  Did Not Attend  Participation Quality:  did not attend  Affect:  did not attend  Cognitive:  did not attend  Insight:  None  Engagement in Group:  did not attend  Modes of Intervention:  did not attend  Summary of Progress/Problems: did not attend  Renaee Munda 05/26/2013, 9:25 AM

## 2013-05-26 NOTE — Progress Notes (Signed)
Recreation Therapy Notes  Animal-Assisted Activity/Therapy (AAA/T) Program Checklist/Progress Notes Patient Eligibility Criteria Checklist & Daily Group note for Rec Tx Intervention  Date: 12.02.2014 Time: 2:30pm Location: 300 [address] Dayroom   AAA/T Program Assumption of Risk Form signed by Patient/ or Parent Legal Guardian yes  Patient is free of allergies or sever asthma  yes  Patient reports no fear of animals yes  Patient reports no history of cruelty to animals yes.   Patient understands his/her participation is voluntary yes.  Patient washes hands before animal contact yes  Patient washes hands after animal contact yes  Behavioral Response: Engaged, Appropriate   Education: Charity fundraiser, Appropriate Animal Interaction   Education Outcome: Acknowledges understanding   Clinical Observations/Feedback: Patient interacted appropriately with dog team and LRT.    Marykay Lex Tywone Bembenek, LRT/CTRS  Jearl Klinefelter 05/26/2013 4:33 PM

## 2013-05-26 NOTE — B[facility] Group Notes (Signed)
[facility] LCSW Group Therapy  05/26/2013 1:31 PM  Type of Therapy:  Group Therapy  Participation Level:  Active  Participation Quality:  Attentive  Affect:  Appropriate  Cognitive:  Alert and Oriented  Insight:  Engaged  Engagement in Therapy:  Engaged and Improving  Modes of Intervention:  Confrontation, Discussion, Education, Exploration, Socialization and Support  Summary of Progress/Problems: MHA Speaker came to talk about his personal journey with substance abuse and addiction. The pt processed ways by which to relate to the speaker. MHA speaker provided handouts and educational information pertaining to groups and services offered by the [facility]. [name] was engaged and attentive throughout today's therapy group. She actively listened as speaker shared his personal experiences with MI and SA. [name] demonstrates progress in the group setting AEB her ability to actively listen and remain attentive throughout today's group. [name] thanked the speaker for coming and sharing his story.   Smart, Durrell Barajas LCSWA 05/26/2013, 1:31 PM

## 2013-05-26 NOTE — B[facility] Suicide Risk Assessment (Signed)
[facility] INPATIENT:  Family/Significant Other Suicide Prevention Education  Suicide Prevention Education:  Contact Attempts: Shiva Karis (pt's son) [phone] has been identified by the patient as the family member/significant other with whom the patient will be residing, and identified as the person(s) who will aid the patient in the event of a mental health crisis.  With written consent from the patient, two attempts were made to provide suicide prevention education, prior to and/or following the patient's discharge.  We were unsuccessful in providing suicide prevention education.  A suicide education pamphlet was given to the patient to share with family/significant other.  Date and time of first attempt: 11:30AM  Date and time of second attempt: 3:20PM (voicemail left)   Smart, Claire Dolores LCSWA  05/26/2013, 3:21 PM
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
## 2013-05-26 NOTE — [facility] Suicide Risk Assessment (Signed)
Suicide Risk Assessment  Admission Assessment     Nursing information obtained from:  Patient Demographic factors:  Caucasian Current Mental Status:  Suicidal ideation indicated by patient Loss Factors:  Loss of significant relationship Historical Factors:  Prior suicide attempts Risk Reduction Factors:     CLINICAL FACTORS:   Depression:   Comorbid alcohol abuse/dependence Alcohol/Substance Abuse/Dependencies  COGNITIVE FEATURES THAT CONTRIBUTE TO RISK:  Closed-mindedness Polarized thinking Thought constriction (tunnel vision)    SUICIDE RISK:   Moderate:   PLAN OF CARE: Supportive approach/coping skills/relapse prevention                               [facility] detox                               Reassess and address the comorbidities  I certify that inpatient services furnished can reasonably be expected to improve the patient's condition.  Chrys Landgrebe A 05/26/2013, 4:16 PM

## 2013-05-26 NOTE — Progress Notes (Signed)
Pt reports she is doing ok this evening.  She denies SI/HI/AV at this time.  Pt reports minimal withdrawal symptoms tonight.  She says she has been going to groups and participating in unit activities.  Pt says her husband has been cheating on her with a neighbor and she plans to leave him and find her own apartment.  She wants to stay sober and try to find another job as she recently was fired from [employer].  Pt says she feels hopeful and says her son is supportive.  Pt makes her needs known to staff.  Support and encouragement offered.  Safety maintained with q15 minute checks.

## 2013-05-26 NOTE — Clinical Social Work Note (Signed)
CSW called [phone] per pt request to ask for FMLA/STD paperwork to be faxed to CSW. Spoke with Paulina who stated that she will give information (incuding fax) to case manager who will then fax STD paperwork ONLY to [phone]. Per Senaida Ores, pt must bring FMLA to doctor/they are unable to fax this paperwork.  [employer], LCSWA 05/26/2013 3:12 PM

## 2013-05-27 NOTE — Tx Team (Signed)
Interdisciplinary Treatment Plan Update (Adult)  Date: 05/27/2013  Time Reviewed:10:09 AM  Progress in Treatment:  Attending groups: Yes  Participating in groups:  Yes  Taking medication as prescribed: Yes  Tolerating medication: Yes  Family/Significant othe contact made: No. Contact attempts made with pt's son. SPE completed with pt.  Patient understands diagnosis: Yes, AEB seeking treatment for SI with plan, mood stabilization, and medication management.  Discussing patient identified problems/goals with staff: Yes  Medical problems stabilized or resolved: Yes  Denies suicidal/homicidal ideation: Yes during group/self report.  Patient has not harmed self or Others: Yes  New problem(s) identified: n/a  Discharge Plan or Barriers: Pt to follow up at the [facility] for med management and therapy. Pt also stated that she plans [facility]. She will return home briefly until moving into apt after d/c.  Additional comments: 57 Y/O female who states that she has been on Xanax for years. She was here couple of years ago. More recently in August she was depressed as thought her husband was "fooling around" Took about 28 xanax. States that she wanted to go to sleep. She found out "he was sleeping with next door neighbor." She left and staid with her sister. She states she did well when she staid with her. She eventually came back to him as states that he lured her to come back.. She was taking Xanax one or two a day. She used his Xanax as she was not getting a prescription herself. He gets a 90 day supply. .States that she gets upset with anniversaries, birthdays, of mother, close friend who have died. She cries. She stated that she used to drink a lot, now drinks a beer or two couple of times a week ( the information given on this assessment does not go with what was recorded at the ED where she is supposed to be drinking every day up to 8 beers and some  liquor.) She states that the trigger this time is the fact that she confirmed he is still having the affair. She is afraid she could lose her job. She is wanting to leave and find a place for herself. Her husband reported she had endorsed suicidal thoughts but she denies it  Reason for Continuation of Hospitalization: [facility] taper-withdrawals Mood stabilization Estimated length of stay: 1-2 days  For review of initial/current patient goals, please see plan of care.  Attendees:  Patient:    Family:    Physician: Geoffery Lyons MD 05/27/2013 10:08 AM   Nursing: Maureen Ralphs RN 05/27/2013 10:08 AM   Clinical Social Worker Apphia Cropley Smart, LCSWA  05/27/2013 10:08 AM   Other: Darden Dates Nurse CM  05/27/2013 10:08 AM   Other:    Other:    Other:    Scribe for Treatment Team:  [employer] LCSWA 05/27/2013 10:09 AM

## 2013-05-27 NOTE — [facility] Group Notes (Signed)
[facility] LCSW Group Therapy  05/27/2013 3:06 PM  Type of Therapy:  Group Therapy  Participation Level:  Active  Participation Quality:  Attentive  Affect:  Depressed and Tearful  Cognitive:  Alert  Insight:  Engaged  Engagement in Therapy:  Engaged  Modes of Intervention:  Confrontation, Discussion, Education, Exploration, Limit-setting, Problem-solving, Rapport Building, Socialization and Support  Summary of Progress/Problems: Emotion Regulation: This group focused on both positive and negative emotion identification and allowed group members to process ways to identify feelings, regulate negative emotions, and find healthy ways to manage internal/external emotions. Group members were asked to reflect on a time when their reaction to an emotion led to a negative outcome and explored how alternative responses using emotion regulation would have benefited them. Group members were also asked to discuss a time when emotion regulation was utilized when a negative emotion was experienced. Brenda Rice was attentive and engaged throughout today's therapy group. She stated that anger is an emotion that she struggles with regulating. Brenda Rice was able to describe how anger feels to her both physically and emotionally. Brenda Rice talked about her "weakness of crying when I am upset." CSW and the group encouraged Brenda Rice to share why she viewed crying as weakness. Brenda Rice shows progress in the group setting AEB her ability to process how her difficulties regulating negative emotions like "anger and anxiety" fuel her alcohol abuse. She stated that she plans to attend the [facility] in order to "increase my supports and learn how to open up without feeling ashamed."    Brenda Rice, Brenda Rice 05/27/2013, 3:06 PM

## 2013-05-27 NOTE — Progress Notes (Signed)
Adult Psychoeducational Group Note  Date:  05/27/2013 Time:  2:55 PM  Group Topic/Focus:  Crisis Planning:   The purpose of this group is to help patients create a crisis plan for use upon discharge or in the future, as needed.  Participation Level:  Active  Participation Quality:  Appropriate  Affect:  Appropriate  Cognitive:  Alert and Appropriate  Insight: Appropriate and Good  Engagement in Group:  Engaged and Improving  Modes of Intervention:  Discussion, Exploration and Support  Additional Comments:  Pt participated in group and shared her signs of "crisis" which include: not answering the phone, not getting dressed, isolating herself and not going outside. She realized creating a new circle of friends will her. Reynolds Bowl 05/27/2013, 2:55 PM

## 2013-05-27 NOTE — [facility] Group Notes (Signed)
[facility] LCSW Aftercare Discharge Planning Group Note   05/27/2013 9:31 AM  Participation Quality:  Appropriate   Mood/Affect:  Appropriate  Depression Rating:  0  Anxiety Rating:  0  Thoughts of Suicide:  No Will you contract for safety?   NA  Current AVH:  No  Plan for Discharge/Comments:  Pt has followup scheduled with the [facility] for 12/9. She plans to [facility]. Pt to d/c today and plans to return home.   Transportation Means: friend   Supports: friends, son  Smart, Lebron Quam

## 2013-05-27 NOTE — Progress Notes (Signed)
D: Patient denies SI/HI/AVH.  She has a depressed mood and flat affect as she spoke about her disappointment about not being discharged today.  She does show insight into why she is staying and has been interactive with others, calm and cooperative.  A: Patient given emotional support from RN. Patient encouraged to come to staff with concerns and/or questions. Patient's medication routine continued. Patient's orders and plan of care reviewed.   R: Patient remains appropriate and cooperative. Will continue to monitor patient q15 minutes for safety.

## 2013-05-27 NOTE — Progress Notes (Signed)
[facility] MD Progress Note  05/27/2013 1:42 PM Brenda Rice  MRN:  [id number] Subjective:  Continues to minimize. She is active in the miliu, goes to groups. Claimed that there is too much negativity around and that brings her down. She states that she is going to leave her husband and find herself an apartment closer to her job. She is counting on her son's support. She is still minimizing her alcohol use, states she is not going to have access to Xanax a husband is aware that she was using his. Diagnosis:   DSM5: Schizophrenia Disorders:  none Obsessive-Compulsive Disorders:  none Trauma-Stressor Disorders:  none Substance/Addictive Disorders:  Alcohol Related Disorder - Severe (303.90), Benzodiazepine Use Disorder Depressive Disorders:  Major Depressive Disorder - Moderate (296.22)  Axis I: Generalized Anxiety Disorder  ADL's:  Intact  Sleep: Fair  Appetite:  Fair  Suicidal Ideation:  Plan:  denies Intent:  denies Means:  denies Homicidal Ideation:  Plan:  denies Intent:  denies Means:  denies AEB (as evidenced by):  Psychiatric Specialty Exam: Review of Systems  Constitutional: Negative.   HENT: Negative.   Eyes: Negative.   Respiratory: Positive for cough and shortness of breath.   Cardiovascular: Negative.   Gastrointestinal: Negative.   Genitourinary: Negative.   Musculoskeletal: Negative.   Skin: Negative.   Neurological: Negative.   Endo/Heme/Allergies: Negative.   Psychiatric/Behavioral: Positive for depression and substance abuse. The patient is nervous/anxious.     Blood pressure 138/93, pulse 128, temperature 98.1 F (36.7 C), temperature source Oral, resp. rate 16, height 5' 3.39" (1.61 m), weight 66.225 kg (146 lb).Body mass index is 25.55 kg/(m^2).  General Appearance: Fairly Groomed  Patent attorney::  Fair  Speech:  Clear and Coherent  Volume:  Decreased  Mood:  Anxious and Depressed  Affect:  Tearful, sad, anxious  Thought Process:  Coherent and Goal  Directed  Orientation:  Full (Time, Place, and Person)  Thought Content:  wants to be D/C, minimizes, dinies, rationalizes  Suicidal Thoughts:  No  Homicidal Thoughts:  No  Memory:  Immediate;   Fair Recent;   Fair Remote;   Fair  Judgement:  Fair  Insight:  Present  Psychomotor Activity:  Restlessness  Concentration:  Fair  Recall:  Fair  Akathisia:  No  Handed:    AIMS (if indicated):     Assets:  Desire for Improvement  Sleep:  Number of Hours: 6.25   Current Medications: Current Facility-Administered Medications  Medication Dose Route Frequency Provider Last Rate Last Dose  . acetaminophen (TYLENOL) tablet 650 mg  650 mg Oral Q6H PRN Sanjuana Kava, NP   650 mg at 05/25/13 1640  . albuterol (PROVENTIL HFA;VENTOLIN HFA) 108 (90 BASE) MCG/ACT inhaler 2 puff  2 puff Inhalation Q6H PRN Sanjuana Kava, NP      . alum & mag hydroxide-simeth (MAALOX/MYLANTA) 200-200-20 MG/5ML suspension 30 mL  30 mL Oral Q4H PRN Sanjuana Kava, NP      . chlordiazePOXIDE (LIBRIUM) capsule 25 mg  25 mg Oral Q6H PRN Sanjuana Kava, NP      . chlordiazePOXIDE (LIBRIUM) capsule 25 mg  25 mg Oral TID Sanjuana Kava, NP   25 mg at 05/27/13 1308   Followed by  . [START ON 05/28/2013] chlordiazePOXIDE (LIBRIUM) capsule 25 mg  25 mg Oral BH-qamhs Sanjuana Kava, NP       Followed by  . [START ON 05/29/2013] chlordiazePOXIDE (LIBRIUM) capsule 25 mg  25 mg Oral Daily Nicole Kindred  I Nwoko, NP      . guaifenesin (ROBITUSSIN) 100 MG/5ML syrup 200 mg  200 mg Oral Q4H PRN Kerry Hough, PA-C   200 mg at 05/26/13 2149  . hydrOXYzine (ATARAX/VISTARIL) tablet 25 mg  25 mg Oral Q6H PRN Sanjuana Kava, NP      . lisinopril (PRINIVIL,ZESTRIL) tablet 40 mg  40 mg Oral q morning - 10a Sanjuana Kava, NP   40 mg at 05/27/13 0815  . loperamide (IMODIUM) capsule 2-4 mg  2-4 mg Oral PRN Sanjuana Kava, NP      . magnesium hydroxide (MILK OF MAGNESIA) suspension 30 mL  30 mL Oral Daily PRN Sanjuana Kava, NP      . multivitamin with minerals  tablet 1 tablet  1 tablet Oral Daily Sanjuana Kava, NP   1 tablet at 05/27/13 0815  . nicotine (NICODERM CQ - dosed in mg/24 hours) patch 21 mg  21 mg Transdermal Q0600 Sanjuana Kava, NP   21 mg at 05/27/13 0454  . ondansetron (ZOFRAN-ODT) disintegrating tablet 4 mg  4 mg Oral Q6H PRN Sanjuana Kava, NP      . thiamine (B-1) injection 100 mg  100 mg Intramuscular Once Sanjuana Kava, NP      . thiamine (VITAMIN B-1) tablet 100 mg  100 mg Oral Daily Sanjuana Kava, NP   100 mg at 05/27/13 0815  . traZODone (DESYREL) tablet 50 mg  50 mg Oral QHS PRN Sanjuana Kava, NP   50 mg at 05/25/13 2238    Lab Results: No results found for this or any previous visit (from the past 48 hour(s)).  Physical Findings: AIMS: Facial and Oral Movements Muscles of Facial Expression: None, normal Lips and Perioral Area: None, normal Jaw: None, normal Tongue: None, normal,Extremity Movements Upper (arms, wrists, hands, fingers): None, normal Lower (legs, knees, ankles, toes): None, normal, Trunk Movements Neck, shoulders, hips: None, normal, Overall Severity Severity of abnormal movements (highest score from questions above): None, normal Incapacitation due to abnormal movements: None, normal Patient's awareness of abnormal movements (rate only patient's report): No Awareness, Dental Status Current problems with teeth and/or dentures?: No Does patient usually wear dentures?: No  CIWA:  CIWA-Ar Total: 1 COWS:     Treatment Plan Summary: Daily contact with patient to assess and evaluate symptoms and progress in treatment Medication management  Plan: Supportive approach/coping skills/relapse prevention           Pursue the detox           CBT;mindfulness           Will start Cymbalta 30 mg daily  Medical Decision Making Problem Points:  Review of psycho-social stressors (1) Data Points:  Review of medication regiment & side effects (2) Review of new medications or change in dosage (2)  I certify that  inpatient services furnished can reasonably be expected to improve the patient's condition.   Chiffon Kittleson A 05/27/2013, 1:42 PM

## 2013-05-27 NOTE — Progress Notes (Signed)
Adult Psychoeducational Group Note  Date:  05/27/2013 Time:  9:52 PM  Group Topic/Focus:  Recovery Goals:   The focus of this group is to identify appropriate goals for recovery and establish a plan to achieve them.  Participation Level:  Active  Participation Quality:  Appropriate  Affect:  Appropriate  Cognitive:  Appropriate  Insight: Appropriate  Engagement in Group:  Engaged  Modes of Intervention:  Support  Additional Comments:  Pt went to NA.   Chanon Loney 05/27/2013, 9:52 PM

## 2013-05-27 NOTE — Progress Notes (Signed)
Manual

## 2013-05-27 NOTE — Progress Notes (Signed)
Patient ID: Brenda Rice, female   DOB: Nov 11, 1955, 57 y.o.   MRN: [id number] She has been up and to groups today interacting with peers and staff.  She voiced that she was sad that she was not discharged today but hat she understood why.  She denies withdrawal symptoms.

## 2013-05-28 MED ORDER — HYDROCHLOROTHIAZIDE 12.5 MG PO CAPS
12.5000 mg | ORAL_CAPSULE | Freq: Every day | ORAL | Status: DC
  Administered 2013-05-28 – 2013-05-29 (×2): 12.5 mg via ORAL
  Filled 2013-05-28 (×5): qty 1

## 2013-05-28 MED ORDER — POTASSIUM CHLORIDE CRYS ER 10 MEQ PO TBCR
10.0000 meq | EXTENDED_RELEASE_TABLET | Freq: Every day | ORAL | Status: DC
  Administered 2013-05-28 – 2013-05-29 (×2): 10 meq via ORAL
  Filled 2013-05-28 (×3): qty 1
  Filled 2013-05-28: qty 7

## 2013-05-28 MED ORDER — DULOXETINE HCL 30 MG PO CPEP
30.0000 mg | ORAL_CAPSULE | Freq: Every day | ORAL | Status: DC
  Administered 2013-05-28 – 2013-05-29 (×2): 30 mg via ORAL
  Filled 2013-05-28 (×3): qty 1
  Filled 2013-05-28: qty 7
  Filled 2013-05-28 (×2): qty 1

## 2013-05-28 NOTE — [facility] Group Notes (Signed)
[facility] LCSW Group Therapy  05/28/2013  1:15 PM   Type of Therapy:  Group Therapy  Participation Level:  Active  Participation Quality:  Attentive, Sharing and Supportive  Affect:  Tearful and Depressed  Cognitive:  Alert and Oriented  Insight:  Developing/Improving and Engaged  Engagement in Therapy:  Developing/Improving and Engaged  Modes of Intervention:  Clarification, Confrontation, Discussion, Education, Exploration, Limit-setting, Orientation, Problem-solving, Rapport Building, Dance movement psychotherapist, Socialization and Support  Summary of Progress/Problems: The topic for group was balance in life.  Today's group focused on defining balance in one's own words, identifying things that can knock one off balance, and exploring healthy ways to maintain balance in life. Group members were asked to provide an example of a time when they felt off balance, describe how they handled that situation,and process healthier ways to regain balance in the future. Group members were asked to share the most important tool for maintaining balance that they learned while at [facility] and how they plan to apply this method after discharge.  Pt shared that she defines life off balance as a lack of control.  Pt states that she last felt her life was balanced when she was 57 years old, when things were care free and going well in her life.  Pt was able to process taking her first drink then, and becoming a functional alcoholic since.  Pt states that she is motivated to get her life back in balance for her grand sons, so they can always remember her in a good light and not as an alcoholic.  Pt shared that she plans to get her life back in balance by making changes, getting her own apartment and attending support groups.  Pt actively participated and was engaged in group discussion.    Reyes Ivan, LCSW 05/28/2013 2:18 PM

## 2013-05-28 NOTE — Progress Notes (Signed)
Adult Psychoeducational Group Note  Date:  05/28/2013 Time:  2:49 PM  Group Topic/Focus:  Overcoming Stress:   The focus of this group is to define stress and help patients assess their triggers.  Participation Level:  Active  Participation Quality:  Appropriate and Attentive  Affect:  Appropriate  Cognitive:  Alert and Appropriate  Insight: Appropriate and Good  Engagement in Group:  Engaged and Improving  Modes of Intervention:  Discussion, Education and Support  Additional Comments:  Pt participated in group and offered ideas and suggestions for handling stress. She appears more hopeful and ready to put into action the tools she has gained while here.  Reynolds Bowl 05/28/2013, 2:49 PM

## 2013-05-28 NOTE — Progress Notes (Signed)
Patient ID: Brenda Rice, female   DOB: 08-20-1955, 57 y.o.   MRN: [id number] Pt attended the 10am AA meeting.

## 2013-05-28 NOTE — Progress Notes (Signed)
[facility] Group Notes:  (Nursing/MHT/Case Management/Adjunct)  Date:  05/28/2013  Time:  9:30 AM  Type of Therapy:  Nurse Education  Participation Level:  Active  Participation Quality:  Appropriate and Attentive  Affect:  Appropriate  Cognitive:  Alert and Appropriate  Insight:  Good  Engagement in Group:  Engaged  Modes of Intervention:  Clarification, Discussion, Education, Exploration, Orientation, Socialization and Support  Summary of Progress/Problems: Pt plans on waking up being positive and liking what she sees in the mirror every morning, then harmony in her life will follow. Pt made the daily goal of staying postive throughout the day today.  Oliva Bustard 05/28/2013, 9:30 AM

## 2013-05-28 NOTE — Progress Notes (Signed)
[facility] MD Progress Note  05/28/2013 4:29 PM Brenda Rice  MRN:  [id number] Subjective:  Continues to try to get her life back together. She is planning to go to her house, pack her things and get an apartment. A friend is going to pick her up from the hospital. If the apartment was not available, she plans to stay with her friend. She is going to notify her husband later. She states that she is not calling it quits but that she needs an extended period of time to get herself well, sober before considering getting back with her husband. She does like her job so she does not have any major concerns going back Diagnosis:   DSM5: Schizophrenia Disorders:  none Obsessive-Compulsive Disorders:  none Trauma-Stressor Disorders:  none Substance/Addictive Disorders:  Alcohol Related Disorder - Moderate (303.90), Benzodiazepine related disorder Depressive Disorders:  Major Depressive Disorder - Moderate (296.22)  Axis I: Generalized Anxiety Disorder  ADL's:  Intact  Sleep: Fair  Appetite:  Fair  Suicidal Ideation:  Plan:  denies Intent:  denies Means:  denies Homicidal Ideation:  Plan:  denies Intent:  denies Means:  denies AEB (as evidenced by):  Psychiatric Specialty Exam: Review of Systems  Constitutional: Negative.   HENT: Negative.   Eyes: Negative.   Respiratory: Negative.   Cardiovascular: Negative.   Gastrointestinal: Negative.   Genitourinary: Negative.   Musculoskeletal: Negative.   Skin: Negative.   Neurological: Negative.   Endo/Heme/Allergies: Negative.   Psychiatric/Behavioral: Positive for depression and substance abuse. The patient is nervous/anxious.     Blood pressure 158/101, pulse 98, temperature 98.2 F (36.8 C), temperature source Oral, resp. rate 16, height 5' 3.39" (1.61 m), weight 66.225 kg (146 lb).Body mass index is 25.55 kg/(m^2).  General Appearance: Fairly Groomed  Patent attorney::  Fair  Speech:  Clear and Coherent  Volume:  Normal  Mood:  sad,  worried  Affect:  Appropriate and Tearful  Thought Process:  Coherent and Goal Directed  Orientation:  Full (Time, Place, and Person)  Thought Content:  events, plans, worries, concerns  Suicidal Thoughts:  No  Homicidal Thoughts:  No  Memory:  Immediate;   Fair Recent;   Fair Remote;   Fair  Judgement:  Fair  Insight:  Present  Psychomotor Activity:  Restlessness  Concentration:  Fair  Recall:  Fair  Akathisia:  No  Handed:    AIMS (if indicated):     Assets:  Desire for Improvement Transportation Vocational/Educational  Sleep:  Number of Hours: 5.75   Current Medications: Current Facility-Administered Medications  Medication Dose Route Frequency Provider Last Rate Last Dose  . acetaminophen (TYLENOL) tablet 650 mg  650 mg Oral Q6H PRN Sanjuana Kava, NP   650 mg at 05/28/13 0805  . albuterol (PROVENTIL HFA;VENTOLIN HFA) 108 (90 BASE) MCG/ACT inhaler 2 puff  2 puff Inhalation Q6H PRN Sanjuana Kava, NP      . alum & mag hydroxide-simeth (MAALOX/MYLANTA) 200-200-20 MG/5ML suspension 30 mL  30 mL Oral Q4H PRN Sanjuana Kava, NP      . chlordiazePOXIDE (LIBRIUM) capsule 25 mg  25 mg Oral BH-qamhs Sanjuana Kava, NP   25 mg at 05/28/13 0802   Followed by  . [START ON 05/29/2013] chlordiazePOXIDE (LIBRIUM) capsule 25 mg  25 mg Oral Daily Sanjuana Kava, NP      . guaifenesin (ROBITUSSIN) 100 MG/5ML syrup 200 mg  200 mg Oral Q4H PRN Kerry Hough, PA-C   200 mg at 05/27/13  2214  . hydrochlorothiazide (MICROZIDE) capsule 12.5 mg  12.5 mg Oral Daily Sanjuana Kava, NP   12.5 mg at 05/28/13 0947  . lisinopril (PRINIVIL,ZESTRIL) tablet 40 mg  40 mg Oral q morning - 10a Sanjuana Kava, NP   40 mg at 05/28/13 0803  . magnesium hydroxide (MILK OF MAGNESIA) suspension 30 mL  30 mL Oral Daily PRN Sanjuana Kava, NP      . multivitamin with minerals tablet 1 tablet  1 tablet Oral Daily Sanjuana Kava, NP   1 tablet at 05/28/13 0803  . nicotine (NICODERM CQ - dosed in mg/24 hours) patch 21 mg  21 mg  Transdermal Q0600 Sanjuana Kava, NP   21 mg at 05/28/13 0615  . potassium chloride (K-DUR,KLOR-CON) CR tablet 10 mEq  10 mEq Oral Daily Sanjuana Kava, NP   10 mEq at 05/28/13 0946  . thiamine (B-1) injection 100 mg  100 mg Intramuscular Once Sanjuana Kava, NP      . thiamine (VITAMIN B-1) tablet 100 mg  100 mg Oral Daily Sanjuana Kava, NP   100 mg at 05/28/13 0803  . traZODone (DESYREL) tablet 50 mg  50 mg Oral QHS PRN Sanjuana Kava, NP   50 mg at 05/27/13 2215    Lab Results: No results found for this or any previous visit (from the past 48 hour(s)).  Physical Findings: AIMS: Facial and Oral Movements Muscles of Facial Expression: None, normal Lips and Perioral Area: None, normal Jaw: None, normal Tongue: None, normal,Extremity Movements Upper (arms, wrists, hands, fingers): None, normal Lower (legs, knees, ankles, toes): None, normal, Trunk Movements Neck, shoulders, hips: None, normal, Overall Severity Severity of abnormal movements (highest score from questions above): None, normal Incapacitation due to abnormal movements: None, normal Patient's awareness of abnormal movements (rate only patient's report): No Awareness, Dental Status Current problems with teeth and/or dentures?: No Does patient usually wear dentures?: No  CIWA:  CIWA-Ar Total: 0 COWS:     Treatment Plan Summary: Daily contact with patient to assess and evaluate symptoms and progress in treatment Medication management  Plan: Supportive approach/coping skills, relapse prevention           Will start the Cymbalta  Medical Decision Making Problem Points:  Review of psycho-social stressors (1) Data Points:  Review of medication regiment & side effects (2) Review of new medications or change in dosage (2)  I certify that inpatient services furnished can reasonably be expected to improve the patient's condition.   [name] 05/28/2013, 4:29 PM

## 2013-05-28 NOTE — Progress Notes (Signed)
Recreation Therapy Notes  Date: 12.04.2014 Time: 3:00pm Location: [address] Dayroom   Group Topic: Communication, Team Building, Problem Solving  Goal Area(s) Addresses:  Patient will effectively work with peer towards shared goal.  Patient will identify skill used to make activity successful.  Patient will identify how skills used during activity can be used to reach post d/c goals.   Behavioral Response: Engaged, Attentive, Appropriate   Intervention: Problem Solving Activitiy  Activity: Life Boat. Patients were given a scenario about being on a sinking yacht. Patients were informed the yacht included 15 guest, 8 of which could be placed on the life boat, along with all group members. Individuals on guest list were of varying socioeconomic classes such as a Education officer, museum, Materials engineer, Midwife, Tree surgeon.   Education: Special educational needs teacher, Team Work, Scientist, physiological, Journalist, newspaper, Discharge Planning   Education Outcome: Acknowledges understanding  Clinical Observations/Feedback: Patient actively engaged in group activity, voicing her opinion and debating with group members appropriately. Patient identified qualities that guided her decision making and related theses skills to her recovery process. Patient made no additional contributions to group discussion, but appeared to actively listen as she maintained appropriate eye contact with speaker.    Marykay Lex Veldon Wager, LRT/CTRS  Jearl Klinefelter 05/28/2013 3:56 PM

## 2013-05-28 NOTE — Progress Notes (Signed)
Patient did attend the evening karaoke group. Pt was engaged, supportive, and participated by singing a song with a group of her peers.

## 2013-05-28 NOTE — Progress Notes (Signed)
Patient ID: Brenda Rice, female   DOB: 07-09-1955, 57 y.o.   MRN: [id number] She has been up and to groups interacting with peers and staff. Her affect is brighter and she saays that she is feeling much better. Says that she a a good follow up plan.

## 2013-05-29 DIAGNOSIS — F339 Major depressive disorder, recurrent, unspecified: Secondary | ICD-10-CM

## 2013-05-29 MED ORDER — ALBUTEROL SULFATE HFA 108 (90 BASE) MCG/ACT IN AERS: 2.0000 | INHALATION_SPRAY | Freq: Four times a day (QID) | RESPIRATORY_TRACT | Status: DC | PRN

## 2013-05-29 MED ORDER — POTASSIUM CHLORIDE CRYS ER 10 MEQ PO TBCR: 10.0000 meq | EXTENDED_RELEASE_TABLET | Freq: Every day | ORAL | Status: DC

## 2013-05-29 MED ORDER — DULOXETINE HCL 30 MG PO CPEP: 30.0000 mg | ORAL_CAPSULE | Freq: Every day | ORAL | Status: DC

## 2013-05-29 MED ORDER — TRAZODONE HCL 50 MG PO TABS: 50.0000 mg | ORAL_TABLET | Freq: Every evening | ORAL | Status: DC | PRN

## 2013-05-29 MED ORDER — HYDROCHLOROTHIAZIDE 12.5 MG PO CAPS: 12.5000 mg | ORAL_CAPSULE | Freq: Every day | ORAL | Status: DC

## 2013-05-29 MED ORDER — LISINOPRIL 40 MG PO TABS: 40.0000 mg | ORAL_TABLET | Freq: Every morning | ORAL | Status: DC

## 2013-05-29 NOTE — Discharge Summary (Signed)
Physician Discharge Summary Note  Patient:  Brenda Rice is an 57 y.o., female MRN:  [id number] DOB:  05-18-1956 Patient phone:  [phone] (home)  Patient address:   [address] [place] [address],   Date of Admission:  05/25/2013 Date of Discharge: 05/29/13  Reason for Admission: Alcohol/drug detox  Discharge Diagnoses: Active Problems:   Benzodiazepine dependence   Alcohol dependence   GAD (generalized anxiety disorder)   Depressive disorder  Review of Systems  Constitutional: Negative.   HENT: Negative.   Eyes: Negative.   Respiratory: Negative.   Cardiovascular: Negative.   Gastrointestinal: Negative.   Genitourinary: Negative.   Musculoskeletal: Negative.   Skin: Negative.   Neurological: Negative.   Endo/Heme/Allergies: Negative.   Psychiatric/Behavioral: Positive for depression (Stabilized with medication prior to discharge) and substance abuse (Alcohol/benzodiazepine dependence). Negative for suicidal ideas, hallucinations and memory loss. The patient is nervous/anxious (Satbilized with medication prior to discharge) and has insomnia (Stabilized with medication prior to discharge).    DSM5: Schizophrenia Disorders:  NA Obsessive-Compulsive Disorders:  NA Trauma-Stressor Disorders:  Generalized anxiety disorder Substance/Addictive Disorders: Alcohol dependence, Benzodiazepine dependence Depressive Disorders:  Major Depressive Disorder - Moderate (296.22)  Axis Diagnosis:   AXIS I:  Alcohol dependence, Benzodiazepine dependence, Generalized anxiety disorder, Major depressive disorder, recurrent AXIS II:  Deferred AXIS III:   Past Medical History  Diagnosis Date  . Hypertension   . Depression   . Suicide attempt     x 3  . Osteoarthritis     right hip  . Fx clavicle     Hx of left clavicle fx  . Hx: UTI (urinary tract infection)    AXIS IV:  other psychosocial or environmental problems and Polysubstance abuse/dependence AXIS V:  63  Level of  Care:  OP  Hospital Course:  57 Y/O female who states that she has been on Xanax for years. She was here couple of years ago. More recently in August she was depressed as thought her husband was "fooling around" Took about 28 xanax. States that she wanted to go to sleep. She found out "he was sleeping with next door neighbor." She left and staid with her sister. She states she did well when she staid with her. She eventually came back to him as states that he lured her to come back.. She was taking Xanax one or two a day. She used his Xanax as she was not getting a prescription herself. He gets a 90 day supply. .States that she gets upset with anniversaries, birthdays, of mother, close friend who have died. She cries. She stated that she used to drink a lot, now drinks a beer or two couple of times a week ( the information given on this assessment does not go with what was recorded at the ED where she is supposed to be drinking every day up to 8 beers and some liquor.) She states that the trigger this time is the fact that she confirmed he is still having the affair. She is afraid she could lose her job. She is wanting to leave and find a place for herself. Her husband reported she had endorsed suicidal thoughts but she denies it.  Upon admission into this hospital, and after admission assessment/evaluation, it was determined based on her toxicology reports that patient will need detoxification treatment to stabilize her system of drug/al;cohol intoxications and to combat the withdrawal symptoms of these substances as well. And her discharge plans included a referral to a long term treatment facility  for more intense substance abuse treatment. Ms. Thorp was then started on Librium detoxification treatment protocols for her alcohol detoxification. She was also enrolled in group counseling sessions and activities to learn coping skills that should help her after discharge to cope better, manage her substance abuse  problems to maintain a much longer sobriety. She also was enrolled and attended AA/NA meetings being offered and held on this unit. Sintia has some other previous and or identifiable medical conditions that required treatment and or monitoring. She received medication management for all those health issues as well. she was monitored closely for any potential problems that may arise as a result of and or during detoxification treatment. Patient tolerated her treatment regimen and detoxification treatment protocols without any significant adverse effects and or reactions reported.  Besides the detoxification treatment protocols, Brinae also was ordered and received Cymbalta 30 mg daily for depression and Trazodone 50 mg Q bedtime for sleep. She also received medication management and monitoring for the medical issues/concerns. Patient attended treatment team meeting this am and met with the treatment team staff. Her reason for admission, present symptoms, substance abuse issues, response to treatment and discharge plans discussed. Patient endorsed that she is doing well and stable for discharge to pursue the next phase of her substance abuse treatment. It was agreed upon between patient and the team that she will be discharged to follow-up care for further substance abuse treatment at [facility] here in [place], [place] on 06/02/13 at 10:00 am. And for medication management and monitoring, she receive services at the [facility] behavioral health. The addresses and contact information for all these appointments provided for patient in writing.    Upon discharge, patient adamantly denies suicidal, homicidal ideations, auditory, visual hallucinations, delusional thinking and or withdrawal symptoms. Patient left [facility] with all personal belongings in no apparent distress. She received 2 weeks worth supply samples of her [facility] discharge medications. Transportation per family   Consults:  psychiatry  Significant  Diagnostic Studies:  labs: CBC with diff, CMP, UDS, Toxicology tests, U/A  Discharge Vitals:   Blood pressure 114/80, pulse 116, temperature 98 F (36.7 C), temperature source Oral, resp. rate 16, height 5' 3.39" (1.61 m), weight 66.225 kg (146 lb). Body mass index is 25.55 kg/(m^2). Lab Results:   No results found for this or any previous visit (from the past 72 hour(s)).  Physical Findings: AIMS: Facial and Oral Movements Muscles of Facial Expression: None, normal Lips and Perioral Area: None, normal Jaw: None, normal Tongue: None, normal,Extremity Movements Upper (arms, wrists, hands, fingers): None, normal Lower (legs, knees, ankles, toes): None, normal, Trunk Movements Neck, shoulders, hips: None, normal, Overall Severity Severity of abnormal movements (highest score from questions above): None, normal Incapacitation due to abnormal movements: None, normal Patient's awareness of abnormal movements (rate only patient's report): No Awareness, Dental Status Current problems with teeth and/or dentures?: No Does patient usually wear dentures?: No  CIWA:  CIWA-Ar Total: 0 COWS:     Psychiatric Specialty Exam: See Psychiatric Specialty Exam and Suicide Risk Assessment completed by Attending Physician prior to discharge.  Discharge destination:  Home  Is patient on multiple antipsychotic therapies at discharge:  No   Has Patient had three or more failed trials of antipsychotic monotherapy by history:  No  Recommended Plan for Multiple Antipsychotic Therapies: NA     Medication List       Indication   albuterol 108 (90 BASE) MCG/ACT inhaler  Commonly known as:  PROVENTIL HFA;VENTOLIN  HFA  Inhale 2 puffs into the lungs every 6 (six) hours as needed for wheezing or shortness of breath.   Indication:  Asthma, Chronic Obstructive Lung Disease     DULoxetine 30 MG capsule  Commonly known as:  CYMBALTA  Take 1 capsule (30 mg total) by mouth daily. For depression   Indication:   Major Depressive Disorder     hydrochlorothiazide 12.5 MG capsule  Commonly known as:  MICROZIDE  Take 1 capsule (12.5 mg total) by mouth daily. For hypertension   Indication:  High Blood Pressure     lisinopril 40 MG tablet  Commonly known as:  PRINIVIL,ZESTRIL  Take 1 tablet (40 mg total) by mouth every morning. For high blood pressure control   Indication:  High Blood Pressure     potassium chloride 10 MEQ tablet  Commonly known as:  K-DUR,KLOR-CON  Take 1 tablet (10 mEq total) by mouth daily. For potassium replacement   Indication:  High Blood Pressure, Low Amount of Potassium in the Blood     traZODone 50 MG tablet  Commonly known as:  DESYREL  Take 1 tablet (50 mg total) by mouth at bedtime as needed for sleep.   Indication:  Trouble Sleeping       Follow-up Information   Follow up with [facility] On 06/02/2013. (Arrive by 10AM for assessment with Mr. [facility]. )    Contact information:   [address] [place], [place] [address] Phone: [phone] Fax: [phone]      Follow up with [facility]. (Facility reviewing pt info and will call [place] on Friday morning.)    Contact information:   [address] [place], [place] [address] Phone: [phone] Fax: [phone]     Follow-up recommendations: Activity:  As tolerated Diet: As recommended by your primary care doctor. Keep all scheduled follow-up appointments as recommended.  Continue to work your relapse prevention plan Comments: Take all your medications as prescribed by your mental healthcare provider. Report any adverse effects and or reactions from your medicines to your outpatient provider promptly. Patient is instructed and cautioned to not engage in alcohol and or illegal drug use while on prescription medicines. In the event of worsening symptoms, patient is instructed to call the crisis hotline, 911 and or go to the nearest ED for appropriate evaluation and treatment of  symptoms. Follow-up with your primary care provider for your other medical issues, concerns and or health care needs.   Total Discharge Time:  Greater than 30 minutes.  Signed: Sanjuana Kava, PMHNP-BC 05/29/2013, 9:54 AM Agree with assessment and plan Reymundo Poll. Dub Mikes, M.D.

## 2013-05-29 NOTE — Progress Notes (Signed)
[facility] Case Management Discharge Plan :  Will you be returning to the same living situation after discharge: Yes,  Patient is returning to her homel At discharge, do you have transportation home?:Yes,  Patient will arrange transporation. Do you have the ability to pay for your medications:Yes,  Patient is able to afford medications.  Release of information consent forms completed and in the chart;  Patient's signature needed at discharge.  Patient to Follow up at: Follow-up Information   Follow up with [facility]. ([facility] closed for appointments on Friday. Patient's CSW will call patient on Monday or patient can call for apppointment.)    Contact information:   [address] [place], [place] [address] Phone: [phone] Fax: [phone]      Patient denies SI/HI:   Patient will no longer endorse SI/other thought self harm   Safety Planning and Suicide Prevention discussed:  .Reviewed with all patients during discharge planning group   Saidah Kempton, [place] July 05/29/2013, 11:35 AM

## 2013-05-29 NOTE — BHH Suicide Risk Assessment (Signed)
Suicide Risk Assessment  Discharge Assessment     Demographic Factors:  Caucasian  Mental Status Per Nursing Assessment::   On Admission:  Suicidal ideation indicated by patient  Current Mental Status by Physician: In full contact with reality. There are no suicidal ideas, plans or intent. Her mood is euthymic, her affect is appropriate. There are no active S/S of withdrawal. She is going to continue to work on outpatient basis. She is going to NA first then back to AA and see what works for her. Will be back at work monday   Loss Factors: Loss of significant relationship  Historical Factors: NA  Risk Reduction Factors:   Sense of responsibility to family, Employed and Positive social support  Continued Clinical Symptoms:  Depression:   Comorbid alcohol abuse/dependence Alcohol/Substance Abuse/Dependencies  Cognitive Features That Contribute To Risk:  Polarized thinking Thought constriction (tunnel vision)    Suicide Risk:  Minimal: No identifiable suicidal ideation.  Patients presenting with no risk factors but with morbid ruminations; may be classified as minimal risk based on the severity of the depressive symptoms  Discharge Diagnoses:   AXIS I:  Alcohol Dependence, Benzodiazepine Dependence, Depressive Disorder NOS, GAD AXIS II:  Deferred AXIS III:   Past Medical History  Diagnosis Date  . Hypertension   . Depression   . Suicide attempt     x 3  . Osteoarthritis     right hip  . Fx clavicle     Hx of left clavicle fx  . Hx: UTI (urinary tract infection)    AXIS IV:  problems with primary support group AXIS V:  61-70 mild symptoms  Plan Of Care/Follow-up recommendations:  Activity:  as tolerated Diet:  regular Follow up [facility] Is patient on multiple antipsychotic therapies at discharge:  No   Has Patient had three or more failed trials of antipsychotic monotherapy by history:  No  Recommended Plan for Multiple Antipsychotic  Therapies: NA  Chandon Lazcano A 05/29/2013, 9:22 AM

## 2013-05-29 NOTE — Tx Team (Signed)
Interdisciplinary Treatment Plan Update   Date Reviewed:  05/29/2013  Time Reviewed:  11:44 AM  Progress in Treatment:   Attending groups: Yes Participating in groups: Yes Taking medication as prescribed: Yes  Tolerating medication: Yes Family/Significant other contact made: Yes  Patient understands diagnosis: Yes  Discussing patient identified problems/goals with staff: Yes Medical problems stabilized or resolved: Yes Denies suicidal/homicidal ideation: Yes Patient has not harmed self or others: Yes  For review of initial/current patient goals, please see plan of care.  Estimated Length of Stay:  Discharge today.  Reasons for Continued Hospitalization:   New Problems/Goals identified:    Discharge Plan or Barriers:   Home with outpatient follow up with [facility]  Additional Comments:  Attendees:  Patient:  05/29/2013 11:44 AM   Signature: Jola Baptist, MD 05/29/2013 11:44 AM  Signature:  Kerry Kass, RN 05/29/2013 11:44 AM  Signature: 05/29/2013 11:44 AM  Signature: 05/29/2013 11:44 AM  Signature:   05/29/2013 11:44 AM  Signature:  Horace Porteous Waylan Busta, LCSW 05/29/2013 11:44 AM  Signature:  Reyes Ivan, LCSW 05/29/2013 11:44 AM  Signature: 05/29/2013 11:44 AM  Signature:   05/29/2013 11:44 AM  Signature:  05/29/2013  11:44 AM  Signature:   Onnie Boer, RN [facility] 05/29/2013  11:44 AM  Signature:   05/29/2013  11:44 AM    Scribe for Treatment Team:   Juline Patch,  05/29/2013 11:44 AM

## 2013-05-29 NOTE — BHH Group Notes (Signed)
[facility] LCSW Aftercare Discharge Planning Group Note   05/29/2013 8:45 AM  Participation Quality:  Alert and Appropriate   Mood/Affect:  Calm and Appropriate  Depression Rating:  0  Anxiety Rating:  5  Thoughts of Suicide:  Pt denies SI/HI  Will you contract for safety?   Yes  Current AVH:  Pt denies  Plan for Discharge/Comments:  Pt attended discharge planning group and actively participated in group.  CSW provided pt with today's workbook.  Pt reports feeling stable to d/c today.  Pt states that she plans to move.  Pt will follow up at [facility] for therapy.  No further needs voiced by pt at this time.    Transportation Means: Pt reports access to transportation - friend will pick pt up  Supports: No supports mentioned at this time  Brenda Ivan, LCSW 05/29/2013 10:20 AM

## 2013-05-29 NOTE — Progress Notes (Signed)
D/C instructions/meds/follow-up appointments reviewed, pt verbalized understanding, pt's belongings returned to pt, samples given. 

## 2013-06-03 NOTE — Progress Notes (Signed)
Patient Discharge Instructions:  After Visit Summary (AVS):   Faxed to:  06/03/13 Discharge Summary Note:   Faxed to:  06/03/13 Psychiatric Admission Assessment Note:   Faxed to:  06/03/13 Suicide Risk Assessment - Discharge Assessment:   Faxed to:  06/03/13 Faxed/Sent to the Next Level Care provider:  06/03/13 Faxed to [facility] @ [phone]  Jerelene Redden, 06/03/2013, 1:53 PM

## 2013-06-04 NOTE — Clinical Social Work Note (Signed)
Pt faxed FMLA to CSW. Completed by MD and faxed to employer per pt's instructions (ROI) completed/signed during pt stay at [facility]. FMLA with Chelsea Horton LCSW if pt chooses to pick up. CSW left msg with pt on voicemail letting her know that FMLA had been faxed and is ready to be picked up if pt chooses to do so.   [employer], LCSWA 06/04/2013 3:31 PM

## 2013-06-05 ENCOUNTER — Telehealth: Payer: Self-pay

## 2013-06-05 NOTE — Telephone Encounter (Signed)
We received disability ppw from [facility] for this patient. Called patient to see if she wanted to pay the $15 fee to process and she said to disregard the ppw. It was sent by mistake. ppw has been discarded.

## 2013-06-09 ENCOUNTER — Telehealth: Payer: Self-pay

## 2013-06-09 NOTE — Telephone Encounter (Signed)
[employer] disability forms in Dr. Cyndie Chime box.

## 2013-06-10 NOTE — Telephone Encounter (Signed)
Called and LMOM. [employer] sent me a copy of disability paperwork.  I think from the message from 12/12 that she does NOT need me to fill this out.  If she does need it to be filled I am glad to help her but would need to see her beforehand.  Asked her to call if she does need this to be completed.  otherwise I will shred in a few days

## 2013-09-20 ENCOUNTER — Emergency Department (HOSPITAL_COMMUNITY): Payer: Managed Care, Other (non HMO)

## 2013-09-20 ENCOUNTER — Inpatient Hospital Stay (HOSPITAL_COMMUNITY)
Admission: EM | Admit: 2013-09-20 | Discharge: 2013-09-24 | DRG: 917 | Disposition: A | Discharge status: Other Acute Inpatient Hospital | Payer: Managed Care, Other (non HMO) | Attending: Internal Medicine | Admitting: Internal Medicine

## 2013-09-20 DIAGNOSIS — R4182 Altered mental status, unspecified: Secondary | ICD-10-CM | POA: Diagnosis present

## 2013-09-20 DIAGNOSIS — R0789 Other chest pain: Secondary | ICD-10-CM

## 2013-09-20 DIAGNOSIS — E876 Hypokalemia: Secondary | ICD-10-CM | POA: Diagnosis present

## 2013-09-20 DIAGNOSIS — T424X1A Poisoning by benzodiazepines, accidental (unintentional), initial encounter: Secondary | ICD-10-CM

## 2013-09-20 DIAGNOSIS — T43502A Poisoning by unspecified antipsychotics and neuroleptics, intentional self-harm, initial encounter: Secondary | ICD-10-CM | POA: Diagnosis present

## 2013-09-20 DIAGNOSIS — F411 Generalized anxiety disorder: Secondary | ICD-10-CM

## 2013-09-20 DIAGNOSIS — T438X2A Poisoning by other psychotropic drugs, intentional self-harm, initial encounter: Secondary | ICD-10-CM

## 2013-09-20 DIAGNOSIS — J9690 Respiratory failure, unspecified, unspecified whether with hypoxia or hypercapnia: Secondary | ICD-10-CM

## 2013-09-20 DIAGNOSIS — T424X4A Poisoning by benzodiazepines, undetermined, initial encounter: Principal | ICD-10-CM | POA: Diagnosis present

## 2013-09-20 DIAGNOSIS — F32A Depression, unspecified: Secondary | ICD-10-CM

## 2013-09-20 DIAGNOSIS — F1994 Other psychoactive substance use, unspecified with psychoactive substance-induced mood disorder: Secondary | ICD-10-CM

## 2013-09-20 DIAGNOSIS — F17200 Nicotine dependence, unspecified, uncomplicated: Secondary | ICD-10-CM

## 2013-09-20 DIAGNOSIS — F4325 Adjustment disorder with mixed disturbance of emotions and conduct: Secondary | ICD-10-CM

## 2013-09-20 DIAGNOSIS — J9600 Acute respiratory failure, unspecified whether with hypoxia or hypercapnia: Secondary | ICD-10-CM

## 2013-09-20 DIAGNOSIS — Y92009 Unspecified place in unspecified non-institutional (private) residence as the place of occurrence of the external cause: Secondary | ICD-10-CM

## 2013-09-20 DIAGNOSIS — R5381 Other malaise: Secondary | ICD-10-CM | POA: Diagnosis present

## 2013-09-20 DIAGNOSIS — R5383 Other fatigue: Secondary | ICD-10-CM

## 2013-09-20 DIAGNOSIS — F1320 Sedative, hypnotic or anxiolytic dependence, uncomplicated: Secondary | ICD-10-CM

## 2013-09-20 DIAGNOSIS — R404 Transient alteration of awareness: Secondary | ICD-10-CM | POA: Diagnosis present

## 2013-09-20 DIAGNOSIS — F1020 Alcohol dependence, uncomplicated: Secondary | ICD-10-CM

## 2013-09-20 DIAGNOSIS — T50901A Poisoning by unspecified drugs, medicaments and biological substances, accidental (unintentional), initial encounter: Secondary | ICD-10-CM

## 2013-09-20 DIAGNOSIS — I10 Essential (primary) hypertension: Secondary | ICD-10-CM

## 2013-09-20 DIAGNOSIS — J449 Chronic obstructive pulmonary disease, unspecified: Secondary | ICD-10-CM

## 2013-09-20 DIAGNOSIS — E871 Hypo-osmolality and hyponatremia: Secondary | ICD-10-CM

## 2013-09-20 DIAGNOSIS — Z79899 Other long term (current) drug therapy: Secondary | ICD-10-CM

## 2013-09-20 DIAGNOSIS — R259 Unspecified abnormal involuntary movements: Secondary | ICD-10-CM | POA: Diagnosis present

## 2013-09-20 DIAGNOSIS — F332 Major depressive disorder, recurrent severe without psychotic features: Secondary | ICD-10-CM | POA: Diagnosis present

## 2013-09-20 DIAGNOSIS — IMO0002 Reserved for concepts with insufficient information to code with codable children: Secondary | ICD-10-CM

## 2013-09-20 DIAGNOSIS — J690 Pneumonitis due to inhalation of food and vomit: Secondary | ICD-10-CM

## 2013-09-20 DIAGNOSIS — T50912A Poisoning by multiple unspecified drugs, medicaments and biological substances, intentional self-harm, initial encounter: Secondary | ICD-10-CM

## 2013-09-20 DIAGNOSIS — F329 Major depressive disorder, single episode, unspecified: Secondary | ICD-10-CM

## 2013-09-20 LAB — BLOOD GAS, ARTERIAL
Acid-base deficit: 2.2 mmol/L — ABNORMAL HIGH (ref 0.0–2.0)
BICARBONATE: 21 meq/L (ref 20.0–24.0)
Drawn by: 331471
FIO2: 1 %
O2 Saturation: 99.5 %
PEEP: 5 cmH2O
PH ART: 7.425 (ref 7.350–7.450)
Patient temperature: 98.6
RATE: 14 resp/min
TCO2: 18.7 mmol/L (ref 0–100)
VT: 560 mL
pCO2 arterial: 32.6 mmHg — ABNORMAL LOW (ref 35.0–45.0)
pO2, Arterial: 351 mmHg — ABNORMAL HIGH (ref 80.0–100.0)

## 2013-09-20 LAB — COMPREHENSIVE METABOLIC PANEL
ALK PHOS: 66 U/L (ref 39–117)
ALT: 36 U/L — ABNORMAL HIGH (ref 0–35)
AST: 47 U/L — ABNORMAL HIGH (ref 0–37)
Albumin: 3.8 g/dL (ref 3.5–5.2)
BUN: 4 mg/dL — AB (ref 6–23)
CO2: 21 meq/L (ref 19–32)
Calcium: 9.3 mg/dL (ref 8.4–10.5)
Chloride: 97 mEq/L (ref 96–112)
Creatinine, Ser: 0.5 mg/dL (ref 0.50–1.10)
GLUCOSE: 83 mg/dL (ref 70–99)
Potassium: 3.9 mEq/L (ref 3.7–5.3)
SODIUM: 135 meq/L — AB (ref 137–147)
Total Bilirubin: 0.3 mg/dL (ref 0.3–1.2)
Total Protein: 7.5 g/dL (ref 6.0–8.3)

## 2013-09-20 LAB — RAPID URINE DRUG SCREEN, HOSP PERFORMED
Amphetamines: NOT DETECTED
BARBITURATES: NOT DETECTED
Benzodiazepines: POSITIVE — AB
Cocaine: NOT DETECTED
Opiates: NOT DETECTED
Tetrahydrocannabinol: NOT DETECTED

## 2013-09-20 LAB — CBC WITH DIFFERENTIAL/PLATELET
Basophils Absolute: 0 10*3/uL (ref 0.0–0.1)
Basophils Relative: 0 % (ref 0–1)
EOS PCT: 2 % (ref 0–5)
Eosinophils Absolute: 0.1 10*3/uL (ref 0.0–0.7)
HCT: 39.8 % (ref 36.0–46.0)
Hemoglobin: 14.1 g/dL (ref 12.0–15.0)
LYMPHS ABS: 3.1 10*3/uL (ref 0.7–4.0)
LYMPHS PCT: 61 % — AB (ref 12–46)
MCH: 35.1 pg — AB (ref 26.0–34.0)
MCHC: 35.4 g/dL (ref 30.0–36.0)
MCV: 99 fL (ref 78.0–100.0)
MONOS PCT: 7 % (ref 3–12)
Monocytes Absolute: 0.4 10*3/uL (ref 0.1–1.0)
Neutro Abs: 1.5 10*3/uL — ABNORMAL LOW (ref 1.7–7.7)
Neutrophils Relative %: 29 % — ABNORMAL LOW (ref 43–77)
PLATELETS: 138 10*3/uL — AB (ref 150–400)
RBC: 4.02 MIL/uL (ref 3.87–5.11)
RDW: 13.1 % (ref 11.5–15.5)
WBC: 5.1 10*3/uL (ref 4.0–10.5)

## 2013-09-20 LAB — URINALYSIS, ROUTINE W REFLEX MICROSCOPIC
Bilirubin Urine: NEGATIVE
Glucose, UA: NEGATIVE mg/dL
Hgb urine dipstick: NEGATIVE
Ketones, ur: NEGATIVE mg/dL
Leukocytes, UA: NEGATIVE
NITRITE: NEGATIVE
PH: 5 (ref 5.0–8.0)
Protein, ur: NEGATIVE mg/dL
SPECIFIC GRAVITY, URINE: 1.003 — AB (ref 1.005–1.030)
Urobilinogen, UA: 0.2 mg/dL (ref 0.0–1.0)

## 2013-09-20 LAB — SALICYLATE LEVEL

## 2013-09-20 LAB — TROPONIN I

## 2013-09-20 LAB — TRIGLYCERIDES: TRIGLYCERIDES: 69 mg/dL (ref <150)

## 2013-09-20 LAB — ETHANOL: ALCOHOL ETHYL (B): 332 mg/dL — AB (ref 0–11)

## 2013-09-20 LAB — MRSA PCR SCREENING: MRSA by PCR: NEGATIVE

## 2013-09-20 LAB — LACTIC ACID, PLASMA: LACTIC ACID, VENOUS: 2.7 mmol/L — AB (ref 0.5–2.2)

## 2013-09-20 LAB — ACETAMINOPHEN LEVEL

## 2013-09-20 MED ORDER — FENTANYL CITRATE 0.05 MG/ML IJ SOLN
100.0000 ug | INTRAMUSCULAR | Status: DC | PRN
  Administered 2013-09-21 – 2013-09-22 (×4): 100 ug via INTRAVENOUS
  Filled 2013-09-20 (×4): qty 2

## 2013-09-20 MED ORDER — CHLORHEXIDINE GLUCONATE 0.12 % MT SOLN
15.0000 mL | Freq: Two times a day (BID) | OROMUCOSAL | Status: DC
  Administered 2013-09-20 – 2013-09-22 (×4): 15 mL via OROMUCOSAL
  Filled 2013-09-20 (×4): qty 15

## 2013-09-20 MED ORDER — SODIUM CHLORIDE 0.9 % IV SOLN
20.0000 ug/h | INTRAVENOUS | Status: DC
  Administered 2013-09-20: 20 ug/h via INTRAVENOUS
  Filled 2013-09-20: qty 50

## 2013-09-20 MED ORDER — VITAMINS A & D EX OINT
TOPICAL_OINTMENT | CUTANEOUS | Status: AC
  Administered 2013-09-20: 23:00:00
  Filled 2013-09-20: qty 15

## 2013-09-20 MED ORDER — PANTOPRAZOLE SODIUM 40 MG PO PACK
40.0000 mg | PACK | Freq: Every day | ORAL | Status: DC
  Administered 2013-09-20 – 2013-09-21 (×2): 40 mg
  Filled 2013-09-20 (×3): qty 20

## 2013-09-20 MED ORDER — ENOXAPARIN SODIUM 40 MG/0.4ML ~~LOC~~ SOLN
40.0000 mg | SUBCUTANEOUS | Status: DC
  Administered 2013-09-20 – 2013-09-22 (×3): 40 mg via SUBCUTANEOUS
  Filled 2013-09-20 (×6): qty 0.4

## 2013-09-20 MED ORDER — ROCURONIUM BROMIDE 50 MG/5ML IV SOLN
INTRAVENOUS | Status: AC
  Administered 2013-09-20: 10 mg
  Filled 2013-09-20: qty 2

## 2013-09-20 MED ORDER — SUCCINYLCHOLINE CHLORIDE 20 MG/ML IJ SOLN
INTRAMUSCULAR | Status: AC
  Filled 2013-09-20: qty 1

## 2013-09-20 MED ORDER — LIDOCAINE HCL (CARDIAC) 20 MG/ML IV SOLN
INTRAVENOUS | Status: AC
  Filled 2013-09-20: qty 5

## 2013-09-20 MED ORDER — PROPOFOL 10 MG/ML IV EMUL
5.0000 ug/kg/min | INTRAVENOUS | Status: DC
  Administered 2013-09-20: 5.035 ug/kg/min via INTRAVENOUS
  Filled 2013-09-20: qty 100

## 2013-09-20 MED ORDER — THIAMINE HCL 100 MG/ML IJ SOLN
Freq: Once | INTRAVENOUS | Status: AC
  Administered 2013-09-20: 20:00:00 via INTRAVENOUS
  Filled 2013-09-20: qty 1000

## 2013-09-20 MED ORDER — SODIUM CHLORIDE 0.9 % IV SOLN
2.0000 mg/h | INTRAVENOUS | Status: DC
  Administered 2013-09-20: 2 mg/h via INTRAVENOUS
  Filled 2013-09-20: qty 10

## 2013-09-20 MED ORDER — NALOXONE HCL 1 MG/ML IJ SOLN
INTRAMUSCULAR | Status: AC
  Administered 2013-09-20: 2 mg
  Filled 2013-09-20: qty 2

## 2013-09-20 MED ORDER — SODIUM CHLORIDE 0.9 % IV SOLN
250.0000 mL | INTRAVENOUS | Status: DC | PRN
Start: 2013-09-20 — End: 2013-09-24

## 2013-09-20 MED ORDER — PROPOFOL 10 MG/ML IV EMUL
0.0000 ug/kg/min | INTRAVENOUS | Status: DC
  Administered 2013-09-21 (×3): 45 ug/kg/min via INTRAVENOUS
  Administered 2013-09-22: 25 ug/kg/min via INTRAVENOUS
  Administered 2013-09-22: 45 ug/kg/min via INTRAVENOUS
  Filled 2013-09-20 (×6): qty 100

## 2013-09-20 MED ORDER — AMMONIA AROMATIC IN INHA
0.3000 mL | Freq: Once | RESPIRATORY_TRACT | Status: DC
  Filled 2013-09-20: qty 10

## 2013-09-20 MED ORDER — LABETALOL HCL 5 MG/ML IV SOLN
20.0000 mg | Freq: Once | INTRAVENOUS | Status: AC
  Administered 2013-09-20: 20 mg via INTRAVENOUS
  Filled 2013-09-20: qty 4

## 2013-09-20 MED ORDER — ETOMIDATE 2 MG/ML IV SOLN
INTRAVENOUS | Status: AC
  Administered 2013-09-20: 20 mg
  Filled 2013-09-20: qty 20

## 2013-09-20 MED ORDER — NALOXONE HCL 0.4 MG/ML IJ SOLN
INTRAMUSCULAR | Status: AC
  Filled 2013-09-20: qty 1

## 2013-09-20 MED ORDER — BIOTENE DRY MOUTH MT LIQD
15.0000 mL | Freq: Four times a day (QID) | OROMUCOSAL | Status: DC
  Administered 2013-09-21 – 2013-09-22 (×6): 15 mL via OROMUCOSAL

## 2013-09-20 NOTE — Progress Notes (Signed)
Rudene ChristiansFarral Scherman (Husband) phoned to check on progress. Advised pt is stable and on vent support and that a SBT will be done in the morning to assess status.   During report giving status on husbands condition with law enforcement ensuring that he would not attempt to drive due to his ETOH level.   Husband was clearly still under influence, slurring words and making inappropriate comments. He requested visitation hours, he was advised that he could visit after 8 am tomorrow since pt is stable at this time.

## 2013-09-20 NOTE — Plan of Care (Signed)
Problem: Phase I Progression Outcomes Goal: VTE prophylaxis Outcome: Completed/Met Date Met:  09/20/13 SCD's and Lovenox ordered.  Goal: GIProphysixis Outcome: Completed/Met Date Met:  09/20/13 Protonix ordered. Goal: Oral Care per Protocol Outcome: Completed/Met Date Met:  09/20/13 Ventilated pt oral care protocol ordered.

## 2013-09-20 NOTE — ED Notes (Signed)
Louisa- Poison control notified of overdose of xanax and alcohol. Suggested Supportive care and watch for QRS prolongation.

## 2013-09-20 NOTE — ED Notes (Signed)
X-ray at bedside

## 2013-09-20 NOTE — ED Notes (Addendum)
Pt was drinking etoh with significant other. Witness states pt upended bottle of xanax into mouth, unsure of how much taken as there were multiple bottles of xanax in house. Pt altered. Moans with sternal rubs and yells stop.

## 2013-09-20 NOTE — ED Notes (Signed)
#  18 OG inserted by Cristal Deerhristopher, EMS Student.

## 2013-09-20 NOTE — ED Notes (Signed)
Contact-FARRAL Emami/spouse   [phone].

## 2013-09-20 NOTE — ED Notes (Signed)
Bed: RESA Expected date: 09/20/13 Expected time: 3:36 PM Means of arrival: Ambulance Comments: Overdose

## 2013-09-20 NOTE — ED Notes (Signed)
Dr. Vassie LollAlva at bedside via e-link

## 2013-09-20 NOTE — ED Notes (Signed)
1602 pt on NRB, MD at bedside 1603 attempting intubation, pt gagging and moving arms 1604 20 etomidate given 1605 100 roc given 1606 pt intubated, suctioned mouth, color change noted, bilateral breath sounds.

## 2013-09-20 NOTE — ED Notes (Signed)
Respiratory at bedside.

## 2013-09-20 NOTE — ED Provider Notes (Signed)
CSN: [id number]     Arrival date & time 09/20/13  1534 History   First MD Initiated Contact with Patient 09/20/13 1542     Chief Complaint  Patient presents with  . Drug Overdose  . Altered Mental Status     (Consider location/radiation/quality/duration/timing/severity/associated sxs/prior Treatment) HPI Comments: Patient arrives unresponsive via EMS. She reportedly ingested an unknown amount of Xanax and alcohol. The multiple empty bottles of Xanax in the house. Patient is obtunded and moans to sternal rub. She smells of alcohol. No obvious evidence of trauma.  The history is provided by the patient and the EMS personnel. The history is limited by the condition of the patient.    Past Medical History  Diagnosis Date  . Hypertension   . Depression   . Suicide attempt     x 3  . Osteoarthritis     right hip  . Fx clavicle     Hx of left clavicle fx  . Hx: UTI (urinary tract infection)    Past Surgical History  Procedure Laterality Date  . Orif left clavicle fracture and pinning 2nd metacarpal  01/03/2010   No family history on file. History  Substance Use Topics  . Smoking status: Current Every Day Smoker -- 3.00 packs/day for 40 years    Types: Cigarettes  . Smokeless tobacco: Never Used  . Alcohol Use: 7.8 oz/week    8 Cans of beer, 5 Shots of liquor per week     Comment: drinks daily   OB History   Grav Para Term Preterm Abortions TAB SAB Ect Mult Living                 Review of Systems  Unable to perform ROS: Acuity of condition      Allergies  Review of patient's allergies indicates no known allergies.  Home Medications   No current outpatient prescriptions on file. BP 139/69  Pulse 91  Temp(Src) 98.8 F (37.1 C) (Core (Comment))  Resp 14  Ht 5\' 5"  (1.651 m)  Wt 147 lb 14.9 oz (67.1 kg)  BMI 24.62 kg/m2  SpO2 100% Physical Exam  Constitutional: She appears well-developed and well-nourished. No distress.  No evidence of trauma  HENT:  Head:  Normocephalic and atraumatic.  Mouth/Throat: Oropharynx is clear and moist. No oropharyngeal exudate.  Eyes: Conjunctivae and EOM are normal. Pupils are equal, round, and reactive to light.  Neck: Normal range of motion. Neck supple.  Cardiovascular: Normal rate, regular rhythm and normal heart sounds.   Pulmonary/Chest: Effort normal and breath sounds normal. No respiratory distress.  Abdominal: Soft. There is no tenderness. There is no rebound and no guarding.  Musculoskeletal: Normal range of motion. She exhibits no edema and no tenderness.  Neurological:  Obtunded, moans to sternal rub, does not follow commands  Skin: Skin is warm.    ED Course  INTUBATION Date/Time: 09/20/2013 4:13 PM Performed by: Glynn Octave Authorized by: Glynn Octave Consent: The procedure was performed in an emergent situation. Risks and benefits: risks, benefits and alternatives were discussed Consent given by: patient Patient understanding: patient states understanding of the procedure being performed Time out: Immediately prior to procedure a "time out" was called to verify the correct patient, procedure, equipment, support staff and site/side marked as required. Indications: airway protection and  respiratory failure Intubation method: video-assisted Patient status: unconscious Preoxygenation: nonrebreather mask and BVM Sedatives: etomidate Paralytic: rocuronium Laryngoscope size: Mac 4 Tube size: 7.5 mm Tube type: cuffed Number of attempts: 1 Ventilation  between attempts: BVM Cricoid pressure: no Cords visualized: yes Post-procedure assessment: chest rise and ETCO2 monitor Breath sounds: equal Cuff inflated: yes ETT to lip: 23 cm Tube secured with: ETT holder Chest x-ray interpreted by me. Chest x-ray findings: endotracheal tube in appropriate position Patient tolerance: Patient tolerated the procedure well with no immediate complications.   (including critical care time) Labs  Review Labs Reviewed  CBC WITH DIFFERENTIAL - Abnormal; Notable for the following:    MCH 35.1 (*)    Platelets 138 (*)    Neutrophils Relative % 29 (*)    Neutro Abs 1.5 (*)    Lymphocytes Relative 61 (*)    All other components within normal limits  COMPREHENSIVE METABOLIC PANEL - Abnormal; Notable for the following:    Sodium 135 (*)    BUN 4 (*)    AST 47 (*)    ALT 36 (*)    All other components within normal limits  ETHANOL - Abnormal; Notable for the following:    Alcohol, Ethyl (B) 332 (*)    All other components within normal limits  URINE RAPID DRUG SCREEN (HOSP PERFORMED) - Abnormal; Notable for the following:    Benzodiazepines POSITIVE (*)    All other components within normal limits  URINALYSIS, ROUTINE W REFLEX MICROSCOPIC - Abnormal; Notable for the following:    Specific Gravity, Urine 1.003 (*)    All other components within normal limits  SALICYLATE LEVEL - Abnormal; Notable for the following:    Salicylate Lvl <2.0 (*)    All other components within normal limits  LACTIC ACID, PLASMA - Abnormal; Notable for the following:    Lactic Acid, Venous 2.7 (*)    All other components within normal limits  BLOOD GAS, ARTERIAL - Abnormal; Notable for the following:    pCO2 arterial 32.6 (*)    pO2, Arterial 351.0 (*)    Acid-base deficit 2.2 (*)    All other components within normal limits  MRSA PCR SCREENING  ACETAMINOPHEN LEVEL  TROPONIN I  TRIGLYCERIDES  TROPONIN I  CBC  BASIC METABOLIC PANEL  MAGNESIUM  PHOSPHORUS  TROPONIN I  TROPONIN I   Imaging Review Ct Head Wo Contrast  09/20/2013   CLINICAL DATA:  Altered mental status following and overdose.  EXAM: CT HEAD WITHOUT CONTRAST  TECHNIQUE: Contiguous axial images were obtained from the base of the skull through the vertex without intravenous contrast.  COMPARISON:  04/01/2011.  FINDINGS: The previously demonstrated small rounded area of high density in the anterior aspect of the third ventricle is  unchanged, measuring 5 mm in maximum diameter currently and previously by my measurements. Stable mildly enlarged ventricles and subarachnoid spaces. No significant change in patchy white matter low density in both cerebral hemispheres. No intracranial hemorrhage, mass lesion or CT evidence of acute infarction. Mild bilateral ethmoid sinus mucosal thickening. Small right maxillary sinus retention cyst. Unremarkable bones.  IMPRESSION: 1. No acute abnormality. 2. Stable small third ventricular colloid cyst. 3. Stable atrophy and chronic small vessel white matter ischemic changes. 4. Minimal chronic bilateral ethmoid sinusitis.   Electronically Signed   By: Gordan PaymentSteve  Reid M.D.   On: 09/20/2013 17:36   Dg Chest Portable 1 View  09/20/2013   CLINICAL DATA:  Intubation.  Drug overdose.  Altered mental status.  EXAM: PORTABLE CHEST - 1 VIEW  COMPARISON:  DG CHEST 1V PORT dated 09/20/2013; DG CHEST 1V PORT dated 07/05/2012  FINDINGS: Endotracheal tube tip 35 mm from the carina. Basilar atelectasis is  present. No airspace disease or pleural effusion. Monitoring leads project over the chest. Cardiopericardial silhouette appears within normal limits.  IMPRESSION: Interval intubation with endotracheal tube 35 mm from the carina. Basilar atelectasis.   Electronically Signed   By: Andreas Newport M.D.   On: 09/20/2013 16:34   Dg Chest Portable 1 View  09/20/2013   CLINICAL DATA:  Altered mental status.  EXAM: PORTABLE CHEST - 1 VIEW  COMPARISON:  DG CHEST 1V PORT dated 07/05/2012; DG CHEST 2V dated 09/02/2011; CT ANGIO CHEST W/CM &/OR WO/CM dated 09/30/2006  FINDINGS: Airway thickening is present, suggesting bronchitis or reactive airways disease. No cardiomegaly or compelling findings of edema. No airspace opacity noted.  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 Upper normal heart size.  IMPRESSION: 1. Airway thickening is present, suggesting bronchitis or reactive airways disease.    Electronically Signed   By: Herbie Baltimore M.D.   On: 09/20/2013 16:11     EKG Interpretation   Date/Time:  Sunday September 20 2013 15:51:39 EDT Ventricular Rate:  89 PR Interval:  171 QRS Duration: 92 QT Interval:  367 QTC Calculation: 446 R Axis:   72 Text Interpretation:  Sinus rhythm Probable anteroseptal infarct, old  Minimal ST elevation, inferior leads normalization of inferior ST segments  Confirmed by Jood Retana  MD, Samone Guhl [phone]) on 09/20/2013 4:15:49 PM      MDM   Final diagnoses:  Respiratory failure  Drug overdose    Decreased mental status after alcohol and benzodiazepine ingestion. Initial EKG is concerning for ST elevation inferior leads and appears to be likely artifact.  Repeat EKG showed normalization of ST segments in inferior leads. Discussed with Dr. Tresa Endo on-call interventional cardiologist who agrees not a STEMI at this time.   decision made to intubate patient for airway protection given her degree of somnolence. Patient intubated as above. CT head negative for acute pathology. Patient hypertensive after intubation and sedation increased. IV labetalol given times one.  She'll be admitted to the ICU. Discussed with Dr. Vassie Loll.  CRITICAL CARE Performed by: Glynn Octave Total critical care time: 40 Critical care time was exclusive of separately billable procedures and treating other patients. Critical care was necessary to treat or prevent imminent or life-threatening deterioration. Critical care was time spent personally by me on the following activities: development of treatment plan with patient and/or surrogate as well as nursing, discussions with consultants, evaluation of patient's response to treatment, examination of patient, obtaining history from patient or surrogate, ordering and performing treatments and interventions, ordering and review of laboratory studies, ordering and review of radiographic studies, pulse oximetry and re-evaluation of  patient's condition.       Glynn Octave, MD 09/20/13 2328

## 2013-09-20 NOTE — Progress Notes (Signed)
Rt helped transport pt from ED to ICU vitals stable.

## 2013-09-20 NOTE — Progress Notes (Signed)
Rt helped transport pt to CT. Pt had no adverse reaction while on trip. Pt vitals stable.

## 2013-09-20 NOTE — ED Notes (Signed)
Patient transported to CT 

## 2013-09-21 ENCOUNTER — Inpatient Hospital Stay (HOSPITAL_COMMUNITY): Payer: Managed Care, Other (non HMO)

## 2013-09-21 DIAGNOSIS — J9600 Acute respiratory failure, unspecified whether with hypoxia or hypercapnia: Secondary | ICD-10-CM

## 2013-09-21 DIAGNOSIS — T43591A Poisoning by other antipsychotics and neuroleptics, accidental (unintentional), initial encounter: Secondary | ICD-10-CM

## 2013-09-21 DIAGNOSIS — T424X4A Poisoning by benzodiazepines, undetermined, initial encounter: Secondary | ICD-10-CM

## 2013-09-21 LAB — BASIC METABOLIC PANEL
BUN: 5 mg/dL — ABNORMAL LOW (ref 6–23)
CHLORIDE: 100 meq/L (ref 96–112)
CO2: 19 mEq/L (ref 19–32)
Calcium: 8.1 mg/dL — ABNORMAL LOW (ref 8.4–10.5)
Creatinine, Ser: 0.57 mg/dL (ref 0.50–1.10)
GFR calc non Af Amer: >90 mL/min (ref >90)
GLUCOSE: 71 mg/dL (ref 70–99)
POTASSIUM: 3.7 meq/L (ref 3.7–5.3)
Sodium: 135 mEq/L — ABNORMAL LOW (ref 137–147)

## 2013-09-21 LAB — CBC
HEMATOCRIT: 34.8 % — AB (ref 36.0–46.0)
Hemoglobin: 11.8 g/dL — ABNORMAL LOW (ref 12.0–15.0)
MCH: 33.5 pg (ref 26.0–34.0)
MCHC: 33.9 g/dL (ref 30.0–36.0)
MCV: 98.9 fL (ref 78.0–100.0)
Platelets: 130 10*3/uL — ABNORMAL LOW (ref 150–400)
RBC: 3.52 MIL/uL — ABNORMAL LOW (ref 3.87–5.11)
RDW: 13.2 % (ref 11.5–15.5)
WBC: 8.4 10*3/uL (ref 4.0–10.5)

## 2013-09-21 LAB — TROPONIN I: Troponin I: <0.3 ng/mL (ref <0.30)

## 2013-09-21 LAB — MAGNESIUM: Magnesium: 1.4 mg/dL — ABNORMAL LOW (ref 1.5–2.5)

## 2013-09-21 LAB — PHOSPHORUS: Phosphorus: 3.3 mg/dL (ref 2.3–4.6)

## 2013-09-21 MED ORDER — MAGNESIUM SULFATE 40 MG/ML IJ SOLN
2.0000 g | Freq: Once | INTRAMUSCULAR | Status: AC
  Administered 2013-09-21: 2 g via INTRAVENOUS
  Filled 2013-09-21: qty 50

## 2013-09-21 MED ORDER — SODIUM CHLORIDE 0.9 % IV SOLN
3.0000 g | Freq: Four times a day (QID) | INTRAVENOUS | Status: DC
  Administered 2013-09-21 – 2013-09-23 (×8): 3 g via INTRAVENOUS
  Filled 2013-09-21 (×10): qty 3

## 2013-09-21 MED ORDER — FOLIC ACID 5 MG/ML IJ SOLN
1.0000 mg | Freq: Every day | INTRAMUSCULAR | Status: DC
  Administered 2013-09-21 – 2013-09-24 (×4): 1 mg via INTRAVENOUS
  Filled 2013-09-21 (×5): qty 0.2

## 2013-09-21 MED ORDER — THIAMINE HCL 100 MG/ML IJ SOLN
100.0000 mg | Freq: Every day | INTRAMUSCULAR | Status: DC
  Administered 2013-09-21 – 2013-09-24 (×4): 100 mg via INTRAVENOUS
  Filled 2013-09-21 (×4): qty 1

## 2013-09-21 MED ORDER — ACETAMINOPHEN 160 MG/5ML PO SOLN
650.0000 mg | Freq: Four times a day (QID) | ORAL | Status: DC | PRN
  Administered 2013-09-21 (×2): 650 mg
  Filled 2013-09-21 (×2): qty 20.3

## 2013-09-21 NOTE — H&P (Signed)
PULMONARY / CRITICAL CARE MEDICINE   Name: Brenda Rice MRN: [id number] DOB: August 30, 1955    ADMISSION DATE:  09/20/2013  PRIMARY SERVICE: PCCM  CHIEF COMPLAINT:  Benzodiazepine and alcohol overdose.   BRIEF PATIENT DESCRIPTION:  58 years old female with benzodiazepine and alcohol overdose. Intubated for airway protection. No evidence of trauma.   SIGNIFICANT EVENTS / STUDIES:  CT scan of the head: IMPRESSION:  1. No acute abnormality.  2. Stable small third ventricular colloid cyst.  3. Stable atrophy and chronic small vessel white matter ischemic  changes.  4. Minimal chronic bilateral ethmoid sinusitis.  LINES / TUBES: Peripheral IV's  CULTURES:  ANTIBIOTICS: No antibiotics  HISTORY OF PRESENT ILLNESS:   58 years old female with PMH relevant for HTN and prior suicidal attempts. Was found unresponsive and EMS was called. There were empty bottles of Xanax and the patient had alcohol breath. She was intubated for airway protection. At the time of my exam the patient is unresponsive, intubated, hemodynamically stable.   PAST MEDICAL HISTORY :  Past Medical History  Diagnosis Date  . Hypertension   . Depression   . Suicide attempt     x 3  . Osteoarthritis     right hip  . Fx clavicle     Hx of left clavicle fx  . Hx: UTI (urinary tract infection)    Past Surgical History  Procedure Laterality Date  . Orif left clavicle fracture and pinning 2nd metacarpal  01/03/2010   Prior to Admission medications   Medication Sig Start Date End Date Taking? Authorizing Provider  albuterol (PROVENTIL HFA;VENTOLIN HFA) 108 (90 BASE) MCG/ACT inhaler Inhale 2 puffs into the lungs every 6 (six) hours as needed for wheezing or shortness of breath. 05/29/13   Sanjuana Kava, NP  DULoxetine (CYMBALTA) 30 MG capsule Take 1 capsule (30 mg total) by mouth daily. For depression 05/29/13   Sanjuana Kava, NP  hydrochlorothiazide (MICROZIDE) 12.5 MG capsule Take 1 capsule (12.5 mg total) by mouth  daily. For hypertension 05/29/13   Sanjuana Kava, NP  lisinopril (PRINIVIL,ZESTRIL) 40 MG tablet Take 1 tablet (40 mg total) by mouth every morning. For high blood pressure control 05/29/13   Sanjuana Kava, NP  potassium chloride (K-DUR,KLOR-CON) 10 MEQ tablet Take 1 tablet (10 mEq total) by mouth daily. For potassium replacement 05/29/13   Sanjuana Kava, NP  traZODone (DESYREL) 50 MG tablet Take 1 tablet (50 mg total) by mouth at bedtime as needed for sleep. 05/29/13   Sanjuana Kava, NP   No Known Allergies  FAMILY HISTORY:  No family history on file. SOCIAL HISTORY:  reports that she has been smoking Cigarettes.  She has a 120 pack-year smoking history. She has never used smokeless tobacco. She reports that she drinks about 7.8 ounces of alcohol per week. She reports that she uses illicit drugs (Benzodiazepines).  REVIEW OF SYSTEMS:  Unable to provide  SUBJECTIVE:   VITAL SIGNS: Temp:  [91.2 F (32.9 C)-98.8 F (37.1 C)] 98.8 F (37.1 C) (03/29 2300) Pulse Rate:  [84-125] 112 (03/30 0042) Resp:  [11-22] 14 (03/30 0042) BP: (106-222)/(62-134) 154/76 mmHg (03/30 0042) SpO2:  [92 %-100 %] 99 % (03/30 0042) FiO2 (%):  [40 %-100 %] 40 % (03/30 0042) Weight:  [146 lb (66.225 kg)-147 lb 14.9 oz (67.1 kg)] 147 lb 14.9 oz (67.1 kg) (03/29 1913) HEMODYNAMICS:   VENTILATOR SETTINGS: Vent Mode:  [-] PRVC FiO2 (%):  [40 %-100 %] 40 % Set  Rate:  [14 bmp] 14 bmp Vt Set:  [560 mL] 560 mL PEEP:  [5 cmH20] 5 cmH20 Plateau Pressure:  [19 cmH20-24 cmH20] 21 cmH20 INTAKE / OUTPUT: Intake/Output     03/29 0701 - 03/30 0700   I.V. (mL/kg) 20.3 (0.3)   Total Intake(mL/kg) 20.3 (0.3)   Urine (mL/kg/hr) 1175   Total Output 1175   Net -1154.7         PHYSICAL EXAMINATION: General: Sedated, intubated, no acute distress. Eyes: Anicteric sclerae. Pupils are ENT: ETT in place. Trachea at midline.  Lymph: No cervical, supraclavicular, or axillary lymphadenopathy. Heart: Normal S1, S2. No murmurs,  rubs, or gallops appreciated. No bruits, equal pulses. Lungs: Normal excursion, no dullness to percussion. Good air movement bilaterally, without wheezes or crackles.  Abdomen: Abdomen soft, non-tender and not distended, normoactive bowel sounds. No hepatosplenomegaly or masses. Musculoskeletal: No clubbing or synovitis. No LE edema Skin: No rashes or lesions Neuro: Patient is unresponsive.     LABS:  CBC  Recent Labs Lab 09/20/13 1641  WBC 5.1  HGB 14.1  HCT 39.8  PLT 138*   Coag's No results found for this basename: APTT, INR,  in the last 168 hours BMET  Recent Labs Lab 09/20/13 1641  NA 135*  K 3.9  CL 97  CO2 21  BUN 4*  CREATININE 0.50  GLUCOSE 83   Electrolytes  Recent Labs Lab 09/20/13 1641  CALCIUM 9.3   Sepsis Markers  Recent Labs Lab 09/20/13 1641  LATICACIDVEN 2.7*   ABG  Recent Labs Lab 09/20/13 1640  PHART 7.425  PCO2ART 32.6*  PO2ART 351.0*   Liver Enzymes  Recent Labs Lab 09/20/13 1641  AST 47*  ALT 36*  ALKPHOS 66  BILITOT 0.3  ALBUMIN 3.8   Cardiac Enzymes  Recent Labs Lab 09/20/13 1641 09/20/13 1942  TROPONINI <0.30 <0.30   Glucose No results found for this basename: GLUCAP,  in the last 168 hours  Imaging Ct Head Wo Contrast  09/20/2013   CLINICAL DATA:  Altered mental status following and overdose.  EXAM: CT HEAD WITHOUT CONTRAST  TECHNIQUE: Contiguous axial images were obtained from the base of the skull through the vertex without intravenous contrast.  COMPARISON:  04/01/2011.  FINDINGS: The previously demonstrated small rounded area of high density in the anterior aspect of the third ventricle is unchanged, measuring 5 mm in maximum diameter currently and previously by my measurements. Stable mildly enlarged ventricles and subarachnoid spaces. No significant change in patchy white matter low density in both cerebral hemispheres. No intracranial hemorrhage, mass lesion or CT evidence of acute infarction. Mild  bilateral ethmoid sinus mucosal thickening. Small right maxillary sinus retention cyst. Unremarkable bones.  IMPRESSION: 1. No acute abnormality. 2. Stable small third ventricular colloid cyst. 3. Stable atrophy and chronic small vessel white matter ischemic changes. 4. Minimal chronic bilateral ethmoid sinusitis.   Electronically Signed   By: Gordan Payment M.D.   On: 09/20/2013 17:36   Dg Chest Portable 1 View  09/20/2013   CLINICAL DATA:  Intubation.  Drug overdose.  Altered mental status.  EXAM: PORTABLE CHEST - 1 VIEW  COMPARISON:  DG CHEST 1V PORT dated 09/20/2013; DG CHEST 1V PORT dated 07/05/2012  FINDINGS: Endotracheal tube tip 35 mm from the carina. Basilar atelectasis is present. No airspace disease or pleural effusion. Monitoring leads project over the chest. Cardiopericardial silhouette appears within normal limits.  IMPRESSION: Interval intubation with endotracheal tube 35 mm from the carina. Basilar atelectasis.   Electronically  Signed   By: Andreas NewportGeoffrey  Lamke M.D.   On: 09/20/2013 16:34   Dg Chest Portable 1 View  09/20/2013   CLINICAL DATA:  Altered mental status.  EXAM: PORTABLE CHEST - 1 VIEW  COMPARISON:  DG CHEST 1V PORT dated 07/05/2012; DG CHEST 2V dated 09/02/2011; CT ANGIO CHEST W/CM &/OR WO/CM dated 09/30/2006  FINDINGS: Airway thickening is present, suggesting bronchitis or reactive airways disease. No cardiomegaly or compelling findings of edema. No airspace opacity noted.  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 Upper normal heart size.  IMPRESSION: 1. Airway thickening is present, suggesting bronchitis or reactive airways disease.   Electronically Signed   By: Herbie BaltimoreWalt  Liebkemann M.D.   On: 09/20/2013 16:11     CXR:  No acute infiltrates.  ASSESSMENT / PLAN:  PULMONARY A: 1) Acute respiratory failure due to inability to protect her airway. P:   - Mechanical ventilation   - PRVC, Vt: 8cc/kg, PEEP: 5, RR: 14, FiO2: 100% and adjust to keep O2 sat  > 94%   - VAP prevention order set   - Daily awakening and SBT   CARDIOVASCULAR A:  1) Hemodynamically stable P:  - ICU monitoring  RENAL A:   1) No issues P:   - Will follow chemistry  GASTROINTESTINAL A:   1) No issues P:   - GI prophylaxis with protonix  HEMATOLOGIC A:   1) No issues P:  - Will follow CBC  INFECTIOUS A:   1) No evidence of acute infectious process P:   - No antibiotics  ENDOCRINE A:   1) No issues   NEUROLOGIC A:   1) Benzodiazepine and alcohol overdose 2) Prior suicidal attempts 3) CT of the head with no acute intracranial process P:   - Intubated for airway protection - On propofol for sedation - Daily awakening. - Thiamine - Folate   TODAY'S SUMMARY:   I have personally obtained a history, examined the patient, evaluated laboratory and imaging results, formulated the assessment and plan and placed orders. CRITICAL CARE: Critical Care Time devoted to patient care services described in this note is 60 minutes.   Overton MamAlan Siqueiros, MD Pulmonary and Critical Care Medicine [facility] Pager: [phone]  09/21/2013, 1:14 AM

## 2013-09-21 NOTE — Progress Notes (Signed)
[facility] FOR AM LAB REPLACEMENT ONLY  The patient does not apply [facility] Electrolyte [facility] Protocol based on the criteria listed below:   1. Is GFR >/= 40 ml/min? yes  Patient's GFR today is >90 2. Is urine output >/= 0.5 ml/kg/hr for the last 6 hours? no Patient's UOP is NO UOP recorded ml/kg/hr 3. Is BUN < 60 mg/dL? yes  Patient's BUN today is 5 4. Abnormal electrolyte(s):K 3.7, Mg 1.4 5. Ordered repletion with: NA 6. If a panic level lab has been reported, has the CCM MD in charge been notified? no.   Physician:    Markus DaftWHELAN, Ledon Weihe A 09/21/2013 6:26 AM

## 2013-09-21 NOTE — Progress Notes (Signed)
PULMONARY / CRITICAL CARE MEDICINE   Name: Brenda Rice MRN: [id number] DOB: Mar 16, 1956    ADMISSION DATE:  09/20/2013  PRIMARY SERVICE: PCCM  CHIEF COMPLAINT:  Benzodiazepine and alcohol overdose.   BRIEF PATIENT DESCRIPTION:  58 years old female with benzodiazepine and alcohol overdose. Intubated for airway protection. No evidence of trauma.   SIGNIFICANT EVENTS / STUDIES:  CT scan of the head: 1. No acute abnormality.  2. Stable small third ventricular colloid cyst.  3. Stable atrophy and chronic small vessel white matter ischemic  changes.  4. Minimal chronic bilateral ethmoid sinusitis.  LINES / TUBES: Peripheral IV's  CULTURES:  ANTIBIOTICS: No antibiotics  HISTORY OF PRESENT ILLNESS:   58 years old female with PMH relevant for HTN and prior suicidal attempts. Was found unresponsive and EMS was called. There were empty bottles of Xanax (husband's med that pt takes prn for anxiety) and the patient had alcohol breath. She was intubated for airway protection. A   SUBJECTIVE:  Sedated on propofol Febrile   VITAL SIGNS: Temp:  [87.1 F (30.6 C)-99.7 F (37.6 C)] 93.7 F (34.3 C) (03/30 0700) Pulse Rate:  [80-125] 121 (03/30 0800) Resp:  [11-22] 14 (03/30 0800) BP: (106-222)/(62-134) 125/66 mmHg (03/30 0800) SpO2:  [92 %-100 %] 92 % (03/30 0800) FiO2 (%):  [40 %-100 %] 40 % (03/30 0800) Weight:  [66.225 kg (146 lb)-67.1 kg (147 lb 14.9 oz)] 67.1 kg (147 lb 14.9 oz) (03/29 1913) HEMODYNAMICS:   VENTILATOR SETTINGS: Vent Mode:  [-] PRVC FiO2 (%):  [40 %-100 %] 40 % Set Rate:  [14 bmp] 14 bmp Vt Set:  [560 mL] 560 mL PEEP:  [5 cmH20] 5 cmH20 Plateau Pressure:  [19 cmH20-25 cmH20] 24 cmH20 INTAKE / OUTPUT: Intake/Output     03/29 0701 - 03/30 0700 03/30 0701 - 03/31 0700   I.V. (mL/kg) 161.2 (2.4) 17.9 (0.3)   Total Intake(mL/kg) 161.2 (2.4) 17.9 (0.3)   Urine (mL/kg/hr) 1500 50 (0.4)   Total Output 1500 50   Net -1338.8 -32.1          PHYSICAL  EXAMINATION: General: Sedated, intubated, no acute distress. Eyes: Anicteric sclerae. Pupils are ENT: ETT in place. Trachea at midline.  Lymph: No cervical, supraclavicular, or axillary lymphadenopathy. Heart: Normal S1, S2. No murmurs, rubs, or gallops appreciated. No bruits, equal pulses. Lungs: Normal excursion, no dullness to percussion. Good air movement bilaterally, without wheezes or crackles.  Abdomen: Abdomen soft, non-tender and not distended, normoactive bowel sounds. No hepatosplenomegaly or masses. Musculoskeletal: No clubbing or synovitis. No LE edema Skin: No rashes or lesions Neuro: Patient is unresponsive.     LABS:  CBC  Recent Labs Lab 09/20/13 1641 09/21/13 0200  WBC 5.1 8.4  HGB 14.1 11.8*  HCT 39.8 34.8*  PLT 138* 130*   Coag's No results found for this basename: APTT, INR,  in the last 168 hours BMET  Recent Labs Lab 09/20/13 1641 09/21/13 0200  NA 135* 135*  K 3.9 3.7  CL 97 100  CO2 21 19  BUN 4* 5*  CREATININE 0.50 0.57  GLUCOSE 83 71   Electrolytes  Recent Labs Lab 09/20/13 1641 09/21/13 0200  CALCIUM 9.3 8.1*  MG  --  1.4*  PHOS  --  3.3   Sepsis Markers  Recent Labs Lab 09/20/13 1641  LATICACIDVEN 2.7*   ABG  Recent Labs Lab 09/20/13 1640  PHART 7.425  PCO2ART 32.6*  PO2ART 351.0*   Liver Enzymes  Recent Labs Lab 09/20/13  1641  AST 47*  ALT 36*  ALKPHOS 66  BILITOT 0.3  ALBUMIN 3.8   Cardiac Enzymes  Recent Labs Lab 09/20/13 1641 09/20/13 1942 09/21/13 0200  TROPONINI <0.30 <0.30 <0.30   Glucose No results found for this basename: GLUCAP,  in the last 168 hours  Imaging Ct Head Wo Contrast  09/20/2013   CLINICAL DATA:  Altered mental status following and overdose.  EXAM: CT HEAD WITHOUT CONTRAST  TECHNIQUE: Contiguous axial images were obtained from the base of the skull through the vertex without intravenous contrast.  COMPARISON:  04/01/2011.  FINDINGS: The previously demonstrated small  rounded area of high density in the anterior aspect of the third ventricle is unchanged, measuring 5 mm in maximum diameter currently and previously by my measurements. Stable mildly enlarged ventricles and subarachnoid spaces. No significant change in patchy white matter low density in both cerebral hemispheres. No intracranial hemorrhage, mass lesion or CT evidence of acute infarction. Mild bilateral ethmoid sinus mucosal thickening. Small right maxillary sinus retention cyst. Unremarkable bones.  IMPRESSION: 1. No acute abnormality. 2. Stable small third ventricular colloid cyst. 3. Stable atrophy and chronic small vessel white matter ischemic changes. 4. Minimal chronic bilateral ethmoid sinusitis.   Electronically Signed   By: Gordan Payment M.D.   On: 09/20/2013 17:36   Dg Chest Port 1 View  09/21/2013   CLINICAL DATA:  Endotracheal tube position.  EXAM: PORTABLE CHEST - 1 VIEW  COMPARISON:  DG CHEST 1V PORT dated 09/20/2013  FINDINGS: Lower endotracheal tube, now 16 mm above the carina. New orogastric tube which enters the stomach at least.  New opacification of the left lower lobe, obscuring the diaphragm and causing volume loss. There is likely subsegmental atelectasis in the right infrahilar lung. No evidence of pneumothorax. Normal heart size.  IMPRESSION: 1. Lower endotracheal tube, now 1.6 cm above the carina. 2. New orogastric tube in good position. 3. New left lower lobe atelectasis.   Electronically Signed   By: Tiburcio Pea M.D.   On: 09/21/2013 05:04   Dg Chest Portable 1 View  09/20/2013   CLINICAL DATA:  Intubation.  Drug overdose.  Altered mental status.  EXAM: PORTABLE CHEST - 1 VIEW  COMPARISON:  DG CHEST 1V PORT dated 09/20/2013; DG CHEST 1V PORT dated 07/05/2012  FINDINGS: Endotracheal tube tip 35 mm from the carina. Basilar atelectasis is present. No airspace disease or pleural effusion. Monitoring leads project over the chest. Cardiopericardial silhouette appears within normal limits.   IMPRESSION: Interval intubation with endotracheal tube 35 mm from the carina. Basilar atelectasis.   Electronically Signed   By: Andreas Newport M.D.   On: 09/20/2013 16:34   Dg Chest Portable 1 View  09/20/2013   CLINICAL DATA:  Altered mental status.  EXAM: PORTABLE CHEST - 1 VIEW  COMPARISON:  DG CHEST 1V PORT dated 07/05/2012; DG CHEST 2V dated 09/02/2011; CT ANGIO CHEST W/CM &/OR WO/CM dated 09/30/2006  FINDINGS: Airway thickening is present, suggesting bronchitis or reactive airways disease. No cardiomegaly or compelling findings of edema. No airspace opacity noted.  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 Upper normal heart size.  IMPRESSION: 1. Airway thickening is present, suggesting bronchitis or reactive airways disease.   Electronically Signed   By: Herbie Baltimore M.D.   On: 09/20/2013 16:11     CXR: No acute infiltrates.  ASSESSMENT / PLAN:  PULMONARY A: 1) Acute respiratory failure due to inability to protect her airway. P:   -  SBts with goal extubation   - VAP prevention order set   CARDIOVASCULAR A:  Htn P: resume home meds at some point -  RENAL A:  Hypokalemia/ hypomag P:   -replete  GASTROINTESTINAL A:   1) No issues P:   - GI prophylaxis with protonix  HEMATOLOGIC A:   1) No issues P:  - Will follow CBC  INFECTIOUS A:  Fever ? aspiration P:  Empiric unasyn  ENDOCRINE A:   1) No issues   NEUROLOGIC A:   1) Benzodiazepine and alcohol overdose 2) Prior suicidal attempts 3) CT of the head with no acute intracranial process P:   - On propofol for sedation - Daily awakening. - Thiamine - Folate   TODAY'S SUMMARY: Extubate once fully awake, Psych consult after, Husband does not know name of psychiatrist  I have personally obtained a history, examined the patient, evaluated laboratory and imaging results, formulated the assessment and plan and placed orders. CRITICAL CARE: Critical Care Time devoted to  patient care services described in this note is 45 minutes.   Cyril Mourningakesh Zamani Crocker MD. Tonny BollmanFCCP. [facility] Pager [phone] If no response call [phone]    09/21/2013, 8:47 AM

## 2013-09-21 NOTE — Progress Notes (Signed)
RT placed PT back on FS at approx 1005- RN aware.

## 2013-09-21 NOTE — Progress Notes (Signed)
INITIAL NUTRITION ASSESSMENT  DOCUMENTATION CODES Per approved criteria  -Not Applicable   INTERVENTION: - If pt unable to be extubated in the next 24-48 hours, recommend TF initiation of Vital AF 1.2 via OGT start at 65ml/hr increase by 10ml every 4 hours to goal of 41ml/hr. Goal rate will provide 1296 calories, 81g protein, and free water. TF plus current calories from Propofol will provide 1770 calories and meet 98% estimated calorie needs and 100% of estimated protein needs - Will continue to monitor   NUTRITION DIAGNOSIS: Inadequate oral intake related to inability to eat as evidenced by NPO, mechanical ventilation.   Goal: 1. If pt unable to be extubated, initiation of TF with goal to meet >90% of estimated nutritional needs 2. Once extubated, diet advancement with goal for pt to consume >90% of meals  Monitor:  Weights, labs, vent status  Reason for Assessment: Ventilated pt, low braden  58 y.o. female  Admitting Dx: Benzodiazepine and alcohol overdose.   ASSESSMENT: Pt with PMH relevant for HTN and prior suicidal attempts. Was found unresponsive and EMS was called. There were empty bottles of Xanax and the patient had alcohol breath. She was intubated for airway protection. Alone in room. Plan is for possible extubation today.   Patient is currently intubated on ventilator support MV: 8 L/min Temp (24hrs), Avg:98 F (36.7 C), Min:87.1 F (30.6 C), Max:102.9 F (39.4 C)  Propofol: 17.9 ml/hr provides 472 calories    Getting folic acid, thiamine, and multivitamin in IVF AST/ALT elevated  Magnesium low, getting replacement   Height: Ht Readings from Last 1 Encounters:  09/20/13 5\' 5"  (1.651 m)    Weight: Wt Readings from Last 1 Encounters:  09/20/13 147 lb 14.9 oz (67.1 kg)    Ideal Body Weight: 125 lbs  % Ideal Body Weight: 118%  Wt Readings from Last 10 Encounters:  09/20/13 147 lb 14.9 oz (67.1 kg)  05/25/13 146 lb (66.225 kg)  10/05/12 158  lb (71.668 kg)  07/09/12 155 lb (70.308 kg)  07/06/12 158 lb 1.6 oz (71.714 kg)  07/01/12 154 lb 9.6 oz (70.126 kg)  09/02/11 147 lb (66.679 kg)  07/05/11 148 lb (67.132 kg)  07/11/11 151 lb (68.493 kg)  07/10/11 163 lb 2.3 oz (74 kg)    Usual Body Weight: 146 lbs in December 2014  % Usual Body Weight: 101%  BMI:  Body mass index is 24.62 kg/(m^2).  Estimated Nutritional Needs: Kcal: 1808 Protein: 80-95g Fluid: >1.8L/day  Skin: Sacral wound   Diet Order: NPO  EDUCATION NEEDS: -No education needs identified at this time   Intake/Output Summary (Last 24 hours) at 09/21/13 1150 Last data filed at 09/21/13 1000  Gross per 24 hour  Intake 197.02 ml  Output   1705 ml  Net -1507.98 ml    Last BM: 3/29  Labs:   Recent Labs Lab 09/20/13 1641 09/21/13 0200  NA 135* 135*  K 3.9 3.7  CL 97 100  CO2 21 19  BUN 4* 5*  CREATININE 0.50 0.57  CALCIUM 9.3 8.1*  MG  --  1.4*  PHOS  --  3.3  GLUCOSE 83 71    CBG (last 3)  No results found for this basename: GLUCAP,  in the last 72 hours  Scheduled Meds: . ammonia  0.3 mL Inhalation Once  . ampicillin-sulbactam (UNASYN) IV  3 g Intravenous Q6H  . antiseptic oral rinse  15 mL Mouth Rinse QID  . chlorhexidine  15 mL Mouth Rinse BID  .  enoxaparin (LOVENOX) injection  40 mg Subcutaneous Q24H  . folic acid  1 mg Intravenous Daily  . pantoprazole sodium  40 mg Per Tube Daily  . thiamine IV  100 mg Intravenous Daily    Continuous Infusions: . fentaNYL infusion INTRAVENOUS Stopped (09/20/13 1913)  . propofol 45 mcg/kg/min (09/21/13 16100824)    Past Medical History  Diagnosis Date  . Hypertension   . Depression   . Suicide attempt     x 3  . Osteoarthritis     right hip  . Fx clavicle     Hx of left clavicle fx  . Hx: UTI (urinary tract infection)     Past Surgical History  Procedure Laterality Date  . Orif left clavicle fracture and pinning 2nd metacarpal  01/03/2010    Levon HedgerHeather Baron MS, RD, LDN [phone]  Pager [phone] After Hours Pager

## 2013-09-21 NOTE — Progress Notes (Signed)
CARE MANAGEMENT NOTE 09/21/2013  Patient:  Letitia CaulBACH,Loretta L   Account Number:  [id number]  Date Initiated:  09/21/2013  Documentation initiated by:  Cammeron Greis  Subjective/Objective Assessment:   pt with intentional overdose of meds-benzodiazepine and alcohol / unable to protect airway and requiredintubation/ opt is not awake on [id number].     Action/Plan:   pt has history of same/has seen Dr. Dub MikesLugo for psych problems in the past.   Anticipated DC Date:  09/24/2013   Anticipated DC Plan:  [facility]  In-house referral  Clinical Social Worker      DC Planning Services  NA      [facility]  [facility]   Choice offered to / List presented to:  NA   DME arranged  NA      DME agency  NA     HH arranged  NA      HH agency  NA   Status of service:  In process, will continue to follow Medicare Important Message given?  NA - LOS <3 / Initial given by admissions (If response is "NO", the following Medicare IM given date fields will be blank) Date Medicare IM given:   Date Additional Medicare IM given:    Discharge Disposition:    Per UR Regulation:  Reviewed for med. necessity/level of care/duration of stay  If discussed at Long Length of Stay Meetings, dates discussed:    Comments:  [id number]/Fleming Prill Stark JockDavis, RN, BSN, [place] [phone] Chart Reviewed for discharge and hospital needs. Discharge needs at time of review:  None present will follow for needs. Review of patient progress due on [id number].

## 2013-09-21 NOTE — Progress Notes (Signed)
ANTIBIOTIC CONSULT NOTE - INITIAL  Pharmacy Consult for Unasyn Indication: aspiration pneumonia  No Known Allergies  Patient Measurements: Height: 5\' 5"  (165.1 cm) Weight: 147 lb 14.9 oz (67.1 kg) IBW/kg (Calculated) : 57   Vital Signs: Temp: 93.7 F (34.3 C) (03/30 0700) Temp src: Core (Comment) (03/30 0528) BP: 125/66 mmHg (03/30 0800) Pulse Rate: 121 (03/30 0800) Intake/Output from previous day: 03/29 0701 - 03/30 0700 In: 161.2 [I.V.:161.2] Out: 1500 [Urine:1500]  Labs:  Recent Labs  09/20/13 1641 09/21/13 0200  WBC 5.1 8.4  HGB 14.1 11.8*  PLT 138* 130*  CREATININE 0.50 0.57   Estimated Creatinine Clearance: 68.1 ml/min (by C-G formula based on Cr of 0.57).   Microbiology: Recent Results (from the past 720 hour(s))  MRSA PCR SCREENING     Status: None   Collection Time    09/20/13  8:45 PM      Result Value Ref Range Status   MRSA by PCR NEGATIVE  NEGATIVE Final   Comment:            The GeneXpert MRSA Assay (FDA     approved for NASAL specimens     only), is one component of a     comprehensive MRSA colonization     surveillance program. It is not     intended to diagnose MRSA     infection nor to guide or     monitor treatment for     MRSA infections.    Medical History: Past Medical History  Diagnosis Date  . Hypertension   . Depression   . Suicide attempt     x 3  . Osteoarthritis     right hip  . Fx clavicle     Hx of left clavicle fx  . Hx: UTI (urinary tract infection)     Medications:  Anti-infectives   Start     Dose/Rate Route Frequency Ordered Stop   09/21/13 1000  Ampicillin-Sulbactam (UNASYN) 3 g in sodium chloride 0.9 % 100 mL IVPB     3 g 100 mL/hr over 60 Minutes Intravenous Every 6 hours 09/21/13 0916        Assessment: 57 yoF admitted 3/30 with benzodiazepine and alcohol overdose, found unresponsive and EMS was called.  She was intubated for airway protection.  Pharmacy is consulted to dose Unasyn for fever and  empiric coverage of aspiration.  Tmax: 93.7, Tmin 87.1  WBCs: 8.4  Renal: SCr 0.57, CrCl ~ 68 ml/min  Goal of Therapy:  Appropriate abx dosing, eradication of infection.   Plan:   Unasyn 3g IV q6h  Follow up renal fxn and culture results.   Lynann Beaverhristine [employer]ford Schools PharmD, BCPS Pager [phone] 09/21/2013 9:27 AM

## 2013-09-22 ENCOUNTER — Inpatient Hospital Stay (HOSPITAL_COMMUNITY): Payer: Managed Care, Other (non HMO)

## 2013-09-22 DIAGNOSIS — F329 Major depressive disorder, single episode, unspecified: Secondary | ICD-10-CM

## 2013-09-22 DIAGNOSIS — T43591A Poisoning by other antipsychotics and neuroleptics, accidental (unintentional), initial encounter: Secondary | ICD-10-CM

## 2013-09-22 DIAGNOSIS — T424X4A Poisoning by benzodiazepines, undetermined, initial encounter: Principal | ICD-10-CM

## 2013-09-22 DIAGNOSIS — J9600 Acute respiratory failure, unspecified whether with hypoxia or hypercapnia: Secondary | ICD-10-CM

## 2013-09-22 DIAGNOSIS — F3289 Other specified depressive episodes: Secondary | ICD-10-CM

## 2013-09-22 LAB — CBC
HEMATOCRIT: 37.6 % (ref 36.0–46.0)
HEMOGLOBIN: 12.8 g/dL (ref 12.0–15.0)
MCH: 34.1 pg — ABNORMAL HIGH (ref 26.0–34.0)
MCHC: 34 g/dL (ref 30.0–36.0)
MCV: 100.3 fL — ABNORMAL HIGH (ref 78.0–100.0)
Platelets: 120 10*3/uL — ABNORMAL LOW (ref 150–400)
RBC: 3.75 MIL/uL — ABNORMAL LOW (ref 3.87–5.11)
RDW: 13.4 % (ref 11.5–15.5)
WBC: 15.2 10*3/uL — AB (ref 4.0–10.5)

## 2013-09-22 LAB — BASIC METABOLIC PANEL
BUN: 7 mg/dL (ref 6–23)
CO2: 22 mEq/L (ref 19–32)
Calcium: 8.6 mg/dL (ref 8.4–10.5)
Chloride: 101 mEq/L (ref 96–112)
Creatinine, Ser: 0.51 mg/dL (ref 0.50–1.10)
GFR calc Af Amer: >90 mL/min (ref >90)
Glucose, Bld: 88 mg/dL (ref 70–99)
POTASSIUM: 3.6 meq/L — AB (ref 3.7–5.3)
SODIUM: 137 meq/L (ref 137–147)

## 2013-09-22 MED ORDER — METOPROLOL TARTRATE 1 MG/ML IV SOLN
2.5000 mg | Freq: Once | INTRAVENOUS | Status: AC
  Administered 2013-09-22: 2.5 mg via INTRAVENOUS
  Filled 2013-09-22: qty 5

## 2013-09-22 MED ORDER — POTASSIUM CHLORIDE 10 MEQ/100ML IV SOLN
10.0000 meq | INTRAVENOUS | Status: AC
  Administered 2013-09-22 (×2): 10 meq via INTRAVENOUS
  Filled 2013-09-22: qty 100

## 2013-09-22 MED ORDER — ACETAMINOPHEN 325 MG PO TABS
650.0000 mg | ORAL_TABLET | ORAL | Status: DC | PRN
  Administered 2013-09-23: 650 mg via ORAL
  Filled 2013-09-22: qty 2

## 2013-09-22 MED ORDER — CLONIDINE HCL 0.1 MG PO TABS
0.1000 mg | ORAL_TABLET | Freq: Three times a day (TID) | ORAL | Status: DC
  Administered 2013-09-22 (×2): 0.1 mg via ORAL
  Filled 2013-09-22 (×5): qty 1

## 2013-09-22 MED ORDER — PANTOPRAZOLE SODIUM 40 MG PO TBEC
40.0000 mg | DELAYED_RELEASE_TABLET | Freq: Every day | ORAL | Status: DC
  Filled 2013-09-22: qty 1

## 2013-09-22 MED ORDER — METOPROLOL TARTRATE 1 MG/ML IV SOLN
2.5000 mg | INTRAVENOUS | Status: DC | PRN
  Administered 2013-09-22 – 2013-09-23 (×3): 2.5 mg via INTRAVENOUS
  Filled 2013-09-22 (×4): qty 5

## 2013-09-22 NOTE — Progress Notes (Signed)
PULMONARY / CRITICAL CARE MEDICINE   Name: Brenda Rice MRN: [id number] DOB: 05/20/1956    ADMISSION DATE:  09/20/2013  PRIMARY SERVICE: PCCM  CHIEF COMPLAINT:  Benzodiazepine and alcohol overdose.   BRIEF PATIENT DESCRIPTION:  58 years old female with benzodiazepine and alcohol overdose. Intubated for airway protection. No evidence of trauma.   SIGNIFICANT EVENTS / STUDIES:  CT scan of the head: 1. No acute abnormality.  2. Stable small third ventricular colloid cyst.  3. Stable atrophy and chronic small vessel white matter ischemic  changes.  4. Minimal chronic bilateral ethmoid sinusitis.  LINES / TUBES: Peripheral IV's  CULTURES: 3/30 sputum>> 3/31 uc>>  ANTIBIOTICS: 3/30 unasyn(?asp)>>  HISTORY OF PRESENT ILLNESS:   58 years old female with PMH relevant for HTN and prior suicidal attempts. Was found unresponsive and EMS was called. There were empty bottles of Xanax (husband's med that pt takes prn for anxiety) and the patient had alcohol breath. She was intubated for airway protection. A   SUBJECTIVE:  Sedated on propofol but arouses to voice. Breathing well on PS 5/5   VITAL SIGNS: Temp:  [98.6 F (37 C)-102.9 F (39.4 C)] 98.6 F (37 C) (03/31 0600) Pulse Rate:  [76-133] 76 (03/31 0600) Resp:  [14-45] 14 (03/31 0600) BP: (79-188)/(50-94) 125/70 mmHg (03/31 0600) SpO2:  [81 %-100 %] 100 % (03/31 0600) FiO2 (%):  [40 %] 40 % (03/31 0600) Weight:  [68.5 kg (151 lb 0.2 oz)] 68.5 kg (151 lb 0.2 oz) (03/31 0523) HEMODYNAMICS:   VENTILATOR SETTINGS: Vent Mode:  [-] PRVC FiO2 (%):  [40 %] 40 % Set Rate:  [14 bmp] 14 bmp Vt Set:  [560 mL] 560 mL PEEP:  [5 cmH20] 5 cmH20 Pressure Support:  [5 cmH20-8 cmH20] 8 cmH20 Plateau Pressure:  [22 cmH20-25 cmH20] 25 cmH20 INTAKE / OUTPUT: Intake/Output     03/30 0701 - 03/31 0700 03/31 0701 - 04/01 0700   I.V. (mL/kg) 380.9 (5.6)    IV Piggyback 300    Total Intake(mL/kg) 680.9 (9.9)    Urine (mL/kg/hr) 1020 (0.6)     Total Output 1020     Net -339.2            PHYSICAL EXAMINATION: General: Sedated, intubated, no acute distress. Eyes:. Pupils = reactive 4 mm ENT: ETT in place. Trachea at midline.  Lymph: No cervical, supraclavicular, or axillary lymphadenopathy. Heart: Normal S1, S2. No murmurs, rubs, or gallops appreciated. No bruits, equal pulses. Lungs: Normal excursion, no dullness to percussion. Good air movement bilaterally, mild exp wheeze noted.  Abdomen: Abdomen soft, non-tender and not distended, normoactive bowel sounds. No hepatosplenomegaly or masses. Musculoskeletal: No clubbing or synovitis. No LE edema Skin: No rashes or lesions Neuro: follows commands despite sedation.    LABS:  CBC  Recent Labs Lab 09/20/13 1641 09/21/13 0200 09/22/13 0310  WBC 5.1 8.4 15.2*  HGB 14.1 11.8* 12.8  HCT 39.8 34.8* 37.6  PLT 138* 130* 120*   Coag's No results found for this basename: APTT, INR,  in the last 168 hours BMET  Recent Labs Lab 09/20/13 1641 09/21/13 0200 09/22/13 0310  NA 135* 135* 137  K 3.9 3.7 3.6*  CL 97 100 101  CO2 21 19 22   BUN 4* 5* 7  CREATININE 0.50 0.57 0.51  GLUCOSE 83 71 88   Electrolytes  Recent Labs Lab 09/20/13 1641 09/21/13 0200 09/22/13 0310  CALCIUM 9.3 8.1* 8.6  MG  --  1.4*  --   PHOS  --  3.3  --    Sepsis Markers  Recent Labs Lab 09/20/13 1641  LATICACIDVEN 2.7*   ABG  Recent Labs Lab 09/20/13 1640  PHART 7.425  PCO2ART 32.6*  PO2ART 351.0*   Liver Enzymes  Recent Labs Lab 09/20/13 1641  AST 47*  ALT 36*  ALKPHOS 66  BILITOT 0.3  ALBUMIN 3.8   Cardiac Enzymes  Recent Labs Lab 09/20/13 1641 09/20/13 1942 09/21/13 0200  TROPONINI <0.30 <0.30 <0.30   Glucose No results found for this basename: GLUCAP,  in the last 168 hours  Imaging Ct Head Wo Contrast  09/20/2013   CLINICAL DATA:  Altered mental status following and overdose.  EXAM: CT HEAD WITHOUT CONTRAST  TECHNIQUE: Contiguous axial images  were obtained from the base of the skull through the vertex without intravenous contrast.  COMPARISON:  04/01/2011.  FINDINGS: The previously demonstrated small rounded area of high density in the anterior aspect of the third ventricle is unchanged, measuring 5 mm in maximum diameter currently and previously by my measurements. Stable mildly enlarged ventricles and subarachnoid spaces. No significant change in patchy white matter low density in both cerebral hemispheres. No intracranial hemorrhage, mass lesion or CT evidence of acute infarction. Mild bilateral ethmoid sinus mucosal thickening. Small right maxillary sinus retention cyst. Unremarkable bones.  IMPRESSION: 1. No acute abnormality. 2. Stable small third ventricular colloid cyst. 3. Stable atrophy and chronic small vessel white matter ischemic changes. 4. Minimal chronic bilateral ethmoid sinusitis.   Electronically Signed   By: Gordan Payment M.D.   On: 09/20/2013 17:36   Dg Chest Port 1 View  09/22/2013   CLINICAL DATA:  Aspiration pneumonia  EXAM: PORTABLE CHEST - 1 VIEW  COMPARISON:  09/21/2013  FINDINGS: Endotracheal tube is 2.5 cm above the carina. NG tube in the stomach.  Increase in left lower lobe airspace disease, likely atelectasis. Increase in mild right lower lobe atelectasis. No definite edema.  IMPRESSION: Endotracheal tube in good position.  Increase in atelectasis/infiltrate left greater than right.   Electronically Signed   By: Marlan Palau M.D.   On: 09/22/2013 07:27   Dg Chest Port 1 View  09/21/2013   CLINICAL DATA:  Endotracheal tube position.  EXAM: PORTABLE CHEST - 1 VIEW  COMPARISON:  DG CHEST 1V PORT dated 09/20/2013  FINDINGS: Lower endotracheal tube, now 16 mm above the carina. New orogastric tube which enters the stomach at least.  New opacification of the left lower lobe, obscuring the diaphragm and causing volume loss. There is likely subsegmental atelectasis in the right infrahilar lung. No evidence of pneumothorax.  Normal heart size.  IMPRESSION: 1. Lower endotracheal tube, now 1.6 cm above the carina. 2. New orogastric tube in good position. 3. New left lower lobe atelectasis.   Electronically Signed   By: Tiburcio Pea M.D.   On: 09/21/2013 05:04   Dg Chest Portable 1 View  09/20/2013   CLINICAL DATA:  Intubation.  Drug overdose.  Altered mental status.  EXAM: PORTABLE CHEST - 1 VIEW  COMPARISON:  DG CHEST 1V PORT dated 09/20/2013; DG CHEST 1V PORT dated 07/05/2012  FINDINGS: Endotracheal tube tip 35 mm from the carina. Basilar atelectasis is present. No airspace disease or pleural effusion. Monitoring leads project over the chest. Cardiopericardial silhouette appears within normal limits.  IMPRESSION: Interval intubation with endotracheal tube 35 mm from the carina. Basilar atelectasis.   Electronically Signed   By: Andreas Newport M.D.   On: 09/20/2013 16:34   Dg Chest Portable 1  View  09/20/2013   CLINICAL DATA:  Altered mental status.  EXAM: PORTABLE CHEST - 1 VIEW  COMPARISON:  DG CHEST 1V PORT dated 07/05/2012; DG CHEST 2V dated 09/02/2011; CT ANGIO CHEST W/CM &/OR WO/CM dated 09/30/2006  FINDINGS: Airway thickening is present, suggesting bronchitis or reactive airways disease. No cardiomegaly or compelling findings of edema. No airspace opacity noted.  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 Upper normal heart size.  IMPRESSION: 1. Airway thickening is present, suggesting bronchitis or reactive airways disease.   Electronically Signed   By: Herbie Baltimore M.D.   On: 09/20/2013 16:11     CXR: No acute infiltrates.  ASSESSMENT / PLAN:  PULMONARY A: 1) Acute respiratory failure due to inability to protect her airway. P:   -SBts with goal extubation   - VAP prevention order set   CARDIOVASCULAR A:  Htn P: resume home meds at some point -  RENAL A:  Hypokalemia/ hypomag P:   -replete  GASTROINTESTINAL A:   1) No issues P:   - GI prophylaxis with  protonix  HEMATOLOGIC A:   1) No issues P:  - Will follow CBC  INFECTIOUS A:  Fever ? aspiration P:  Empiric unasyn       Follow culture data  ENDOCRINE A:   1) No issues   NEUROLOGIC A:   1) Benzodiazepine and alcohol overdose 2) Prior suicidal attempts 3) CT of the head with no acute intracranial process P:   - On propofol for sedation - Daily awakening. - Thiamine - Folate -psych consult and admit to [facility] once extubated and medically stable.   TODAY'S SUMMARY: Extubate once fully awake, Psych consult after, followed by Dr. Dub Mikes. Keep sitter  Brett Canales Minor ACNP [facility] PCCM Pager [phone] till 3 pm If no answer page [phone]  Care during the described time interval was provided by me and/or other providers on the critical care team.  I have reviewed this patient's available data, including medical history, events of note, physical examination and test results as part of my evaluation  CC time x  72m  ALVA,RAKESH V.  09/22/2013, 8:08 AM

## 2013-09-22 NOTE — Plan of Care (Signed)
Problem: Phase II Progression Outcomes Goal: Date pt extubated/weaned off vent Outcome: Completed/Met Date Met:  09/22/13 Extubated 3/31

## 2013-09-22 NOTE — Procedures (Signed)
Extubation Procedure Note  Patient Details:   Name: Brenda Rice DOB: 09/21/1955 MRN: [id number]   Airway Documentation:  Airway 7.5 mm (Active)  Secured at (cm) 23 cm 09/22/2013  7:40 AM  Measured From Lips 09/22/2013  7:40 AM  Secured Location Center 09/22/2013  7:40 AM  Secured By [employer] Tube Holder 09/22/2013  7:40 AM  Tube Holder Repositioned Yes 09/22/2013  7:40 AM  Cuff Pressure (cm H2O) 22 cm H2O 09/20/2013  7:40 PM  Site Condition Dry 09/21/2013  3:49 PM    Evaluation  O2 sats: stable throughout and currently acceptable Complications: No apparent complications Patient did tolerate procedure well. Bilateral Breath Sounds: Clear Suctioning: Airway Yes  Prior to extubation: pt suctioned orally, via ETT and subglottic tube, positive cuff leak noted.  Post-extubation: Pt able to cough and produce sputum, speak, no stridor noted.  Antoine Pocherogdon, Devondre Guzzetta Caroline 09/22/2013, 10:12 AM

## 2013-09-22 NOTE — Progress Notes (Signed)
[id number]/Rhonda Earlene Plateravis, RN, BSN, CCM, [phone] Chart reviewed for update of needs and condition./ Patient extubated this am, post cxr= Increase in atelectasis/infiltrate left greater than right./ iv abx continue. Iv sedation dcd.  Psych.  Consult pending.

## 2013-09-22 NOTE — Progress Notes (Signed)
[facility] FOR [facility] LAB REPLACEMENT ONLY  The patient does [facility] based on the criteria listed below:   1. Is GFR >/= 40 ml/min? yes  Patient's GFR today is >90 2. Is urine output >/= 0.5 ml/kg/hr for the last 6 hours? yes Patient's UOP is 1.0 ml/kg/hr 3. Is BUN < 60 mg/dL? yes  Patient's BUN today is 7 4. Abnormal electrolyte(s):K 3.6 5. Ordered repletion with: per protocol 6. If a panic level lab has been reported, has the CCM MD in charge been notified? no.   Physician:    Markus DaftWHELAN, Muhammadali Ries A 09/22/2013 6:37 AM

## 2013-09-22 NOTE — Plan of Care (Signed)
Problem: Phase II Progression Outcomes Goal: Progress activities as ordered Outcome: Progressing OOB to chair

## 2013-09-22 NOTE — Sedation Documentation (Signed)
Fentanyl 235 mL wasted, witness Kevan Nyheryl Denny, RN & Guinevere ScarleteLisa Stepan Verrette, RN

## 2013-09-22 NOTE — Plan of Care (Signed)
Problem: Consults Goal: Ventilated Patients Patient Education See Patient Education Module for education specifics.  Outcome: Progressing Provided education to husband regarding vent, reinforcement needed

## 2013-09-22 NOTE — Progress Notes (Signed)
Name: Brenda Rice MRN: [id number] DOB: 04/24/1956  ELECTRONIC ICU PHYSICIAN NOTE  Problem:  Hypertension/ tachycardia p sedation wearing off  Intervention:  Add clonidine/ changed ppi and tylenol to po rx                          Continue prn IV lopressor   Sandrea HughsMichael Mysti Haley 09/22/2013, 5:52 PM

## 2013-09-23 ENCOUNTER — Inpatient Hospital Stay (HOSPITAL_COMMUNITY): Payer: Managed Care, Other (non HMO)

## 2013-09-23 DIAGNOSIS — F329 Major depressive disorder, single episode, unspecified: Secondary | ICD-10-CM

## 2013-09-23 LAB — CULTURE, RESPIRATORY W GRAM STAIN

## 2013-09-23 LAB — CBC
HEMATOCRIT: 33 % — AB (ref 36.0–46.0)
HEMOGLOBIN: 11.7 g/dL — AB (ref 12.0–15.0)
MCH: 35.5 pg — ABNORMAL HIGH (ref 26.0–34.0)
MCHC: 35.5 g/dL (ref 30.0–36.0)
MCV: 100 fL (ref 78.0–100.0)
Platelets: 109 10*3/uL — ABNORMAL LOW (ref 150–400)
RBC: 3.3 MIL/uL — ABNORMAL LOW (ref 3.87–5.11)
RDW: 13.3 % (ref 11.5–15.5)
WBC: 12 10*3/uL — ABNORMAL HIGH (ref 4.0–10.5)

## 2013-09-23 LAB — BASIC METABOLIC PANEL
BUN: 5 mg/dL — AB (ref 6–23)
CHLORIDE: 100 meq/L (ref 96–112)
CO2: 23 mEq/L (ref 19–32)
Calcium: 9 mg/dL (ref 8.4–10.5)
Creatinine, Ser: 0.51 mg/dL (ref 0.50–1.10)
GLUCOSE: 93 mg/dL (ref 70–99)
Potassium: 3.2 mEq/L — ABNORMAL LOW (ref 3.7–5.3)
Sodium: 137 mEq/L (ref 137–147)

## 2013-09-23 LAB — CULTURE, RESPIRATORY

## 2013-09-23 MED ORDER — POTASSIUM CHLORIDE CRYS ER 20 MEQ PO TBCR
40.0000 meq | EXTENDED_RELEASE_TABLET | Freq: Once | ORAL | Status: AC
  Administered 2013-09-23: 40 meq via ORAL
  Filled 2013-09-23: qty 2

## 2013-09-23 MED ORDER — TRAZODONE HCL 50 MG PO TABS
50.0000 mg | ORAL_TABLET | Freq: Every day | ORAL | Status: DC
  Administered 2013-09-23: 50 mg via ORAL
  Filled 2013-09-23 (×4): qty 1

## 2013-09-23 MED ORDER — AMOXICILLIN-POT CLAVULANATE 875-125 MG PO TABS
1.0000 | ORAL_TABLET | Freq: Two times a day (BID) | ORAL | Status: DC
  Administered 2013-09-23: 1 via ORAL
  Filled 2013-09-23 (×2): qty 1

## 2013-09-23 MED ORDER — LISINOPRIL 40 MG PO TABS
40.0000 mg | ORAL_TABLET | Freq: Every day | ORAL | Status: DC
  Administered 2013-09-23 – 2013-09-24 (×2): 40 mg via ORAL
  Filled 2013-09-23 (×2): qty 1

## 2013-09-23 MED ORDER — LEVOFLOXACIN 750 MG PO TABS
750.0000 mg | ORAL_TABLET | ORAL | Status: DC
  Administered 2013-09-23: 750 mg via ORAL
  Filled 2013-09-23 (×2): qty 1
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
## 2013-09-23 NOTE — Progress Notes (Signed)
Called at 1057 to give report to nurse receiving patient on the 5th floor. NS stated she was not available and she would call me back.

## 2013-09-23 NOTE — Progress Notes (Signed)
PULMONARY / CRITICAL CARE MEDICINE   Name: Brenda Rice MRN: [id number] DOB: 05/06/1956    ADMISSION DATE:  09/20/2013  PRIMARY SERVICE: PCCM  CHIEF COMPLAINT:  Benzodiazepine and alcohol overdose.   BRIEF PATIENT DESCRIPTION:  58 years old female with benzodiazepine and alcohol overdose. Intubated for airway protection. No evidence of trauma.   SIGNIFICANT EVENTS / STUDIES:  CT scan of the head: 1. No acute abnormality.  2. Stable small third ventricular colloid cyst.  3. Stable atrophy and chronic small vessel white matter ischemic  changes.  4. Minimal chronic bilateral ethmoid sinusitis.  LINES / TUBES: Peripheral IV's  CULTURES: 3/30 sputum>> 3/31 uc>>  ANTIBIOTICS: 3/30 unasyn(?asp)>>4/1 4/1 Augmentin x 7 more days>>4/8 stop date    HISTORY OF PRESENT ILLNESS:   58 years old female with PMH relevant for HTN and prior suicidal attempts. Was found unresponsive and EMS was called. There were empty bottles of Xanax (husband's med that pt takes prn for anxiety) and the patient had alcohol breath. She was intubated for airway protection. A   SUBJECTIVE:  Awake alert  VITAL SIGNS: Temp:  [97.5 F (36.4 C)-100.8 F (38.2 C)] 97.5 F (36.4 C) (04/01 0800) Pulse Rate:  [25-122] 84 (04/01 0600) Resp:  [13-26] 19 (04/01 0600) BP: (125-202)/(51-100) 168/76 mmHg (04/01 0600) SpO2:  [86 %-99 %] 93 % (04/01 0600) FiO2 (%):  [40 %] 40 % (03/31 2246) HEMODYNAMICS:   VENTILATOR SETTINGS: Vent Mode:  [-]  FiO2 (%):  [40 %] 40 % INTAKE / OUTPUT: Intake/Output     03/31 0701 - 04/01 0700 04/01 0701 - 04/02 0700   I.V. (mL/kg) 20.2 (0.3)    IV Piggyback 600 100   Total Intake(mL/kg) 620.2 (9.1) 100 (1.5)   Urine (mL/kg/hr) 2920 (1.8)    Total Output 2920     Net -2299.8 +100          PHYSICAL EXAMINATION: General:awake and alert Eyes:. Pupils = reactive 4 mm Lymph: No cervical, supraclavicular, or axillary lymphadenopathy. Heart: Normal S1, S2. No murmurs, rubs,  or gallops appreciated. No bruits, equal pulses. Lungs: Normal excursion, no dullness to percussion. Good air movement bilaterally. On room air  Abdomen: Abdomen soft, non-tender and not distended, normoactive bowel sounds. No hepatosplenomegaly or masses. Musculoskeletal: No clubbing or synovitis. No LE edema Skin: No rashes or lesions Neuro: alert and Orientated     LABS:  CBC  Recent Labs Lab 09/21/13 0200 09/22/13 0310 09/23/13 0310  WBC 8.4 15.2* 12.0*  HGB 11.8* 12.8 11.7*  HCT 34.8* 37.6 33.0*  PLT 130* 120* 109*   Coag's No results found for this basename: APTT, INR,  in the last 168 hours BMET  Recent Labs Lab 09/21/13 0200 09/22/13 0310 09/23/13 0310  NA 135* 137 137  K 3.7 3.6* 3.2*  CL 100 101 100  CO2 19 22 23   BUN 5* 7 5*  CREATININE 0.57 0.51 0.51  GLUCOSE 71 88 93   Electrolytes  Recent Labs Lab 09/21/13 0200 09/22/13 0310 09/23/13 0310  CALCIUM 8.1* 8.6 9.0  MG 1.4*  --   --   PHOS 3.3  --   --    Sepsis Markers  Recent Labs Lab 09/20/13 1641  LATICACIDVEN 2.7*   ABG  Recent Labs Lab 09/20/13 1640  PHART 7.425  PCO2ART 32.6*  PO2ART 351.0*   Liver Enzymes  Recent Labs Lab 09/20/13 1641  AST 47*  ALT 36*  ALKPHOS 66  BILITOT 0.3  ALBUMIN 3.8   Cardiac  Enzymes  Recent Labs Lab 09/20/13 1641 09/20/13 1942 09/21/13 0200  TROPONINI <0.30 <0.30 <0.30   Glucose No results found for this basename: GLUCAP,  in the last 168 hours  Imaging Dg Chest Port 1 View  09/23/2013   CLINICAL DATA:  et  EXAM: PORTABLE CHEST - 1 VIEW  COMPARISON:  None.  FINDINGS: Interval extubation. There is improved aeration at the lung bases. . There is a persistent focus of airspace disease left lower lobe. No pneumothorax.  IMPRESSION: 1. Interval extubation with improvement in aeration at lung bases. 2. Persistent focus of airspace disease in the left lower lobe   Electronically Signed   By: Genevive Bi M.D.   On: 09/23/2013 07:10    Dg Chest Port 1 View  09/22/2013   CLINICAL DATA:  Aspiration pneumonia  EXAM: PORTABLE CHEST - 1 VIEW  COMPARISON:  09/21/2013  FINDINGS: Endotracheal tube is 2.5 cm above the carina. NG tube in the stomach.  Increase in left lower lobe airspace disease, likely atelectasis. Increase in mild right lower lobe atelectasis. No definite edema.  IMPRESSION: Endotracheal tube in good position.  Increase in atelectasis/infiltrate left greater than right.   Electronically Signed   By: Marlan Palau M.D.   On: 09/22/2013 07:27     CXR: No acute infiltrates.  ASSESSMENT / PLAN:  PULMONARY A: 1) Acute respiratory failure due to inability to protect her airway./extubated 3/31 resolved P:   mobilise   CARDIOVASCULAR A:  Htn P: resume home meds on 4/1 . Dc clonidine . -  RENAL A:  Hypokalemia/ hypomag P:   -repleted  GASTROINTESTINAL A:   1) No issues P:   -dc protonix  HEMATOLOGIC A:   1) No issues P:  - Will follow CBC  INFECTIOUS A:  Fever ? aspiration P:  Empiric unasyn, change to Augmentin 4/1        Follow culture data  ENDOCRINE A:   1) No issues   NEUROLOGIC A:   1) Benzodiazepine and alcohol overdose 2) Prior suicidal attempts 3) CT of the head with no acute intracranial process P:   - Off sedation -restart trazodone - Daily awakening. - Thiamine - Folate -psych consult and admit to [facility] once extubated and medically stable. Called 4/1   TODAY'S SUMMARY: Transfer to floor. Psych has been called. Ask triad to assume care 4/2   Brett Canales Minor ACNP [facility] P[facility] Pager [phone] till 3 pm If no answer page [phone]  Independently examined pt, evaluated data & formulated above care plan with NP who scribed this note & edited by me.  Nachman Sundt V.

## 2013-09-23 NOTE — Progress Notes (Signed)
[facility] unit from [facility], alert and oriented. Pt. is tearful, sitter at bed side. Will continue with current plan of care.

## 2013-09-24 DIAGNOSIS — F1020 Alcohol dependence, uncomplicated: Secondary | ICD-10-CM

## 2013-09-24 DIAGNOSIS — J449 Chronic obstructive pulmonary disease, unspecified: Secondary | ICD-10-CM

## 2013-09-24 DIAGNOSIS — J690 Pneumonitis due to inhalation of food and vomit: Secondary | ICD-10-CM

## 2013-09-24 DIAGNOSIS — I10 Essential (primary) hypertension: Secondary | ICD-10-CM

## 2013-09-24 MED ORDER — AMLODIPINE BESYLATE 5 MG PO TABS: 5.0000 mg | ORAL_TABLET | Freq: Every day | ORAL | Status: DC

## 2013-09-24 MED ORDER — VITAMIN B-1 100 MG PO TABS: 100.0000 mg | ORAL_TABLET | Freq: Every day | ORAL | Status: DC

## 2013-09-24 MED ORDER — LEVOFLOXACIN 750 MG PO TABS: 750.0000 mg | ORAL_TABLET | ORAL | Status: AC

## 2013-09-24 MED ORDER — FOLIC ACID 1 MG PO TABS: 1.0000 mg | ORAL_TABLET | Freq: Every day | ORAL | Status: DC

## 2013-09-24 MED ORDER — AMLODIPINE BESYLATE 5 MG PO TABS
5.0000 mg | ORAL_TABLET | Freq: Every day | ORAL | Status: DC
  Administered 2013-09-24: 5 mg via ORAL
  Filled 2013-09-24: qty 1

## 2013-09-24 MED ORDER — POTASSIUM CHLORIDE CRYS ER 20 MEQ PO TBCR
40.0000 meq | EXTENDED_RELEASE_TABLET | Freq: Every day | ORAL | Status: DC
  Administered 2013-09-24: 40 meq via ORAL
  Filled 2013-09-24: qty 2

## 2013-09-24 NOTE — Discharge Summary (Signed)
Physician Discharge Summary  Brenda Rice ZOX:[id number] DOB: 03-22-1956 DOA: 09/20/2013  PCP: Thornell Sartorius, LCSW  Admit date: 09/20/2013 Discharge date: 09/24/2013  Time spent: >35 minutes  Recommendations for Outpatient Follow-up:  F/u with PCP in 1-2 weeks post discharge  Discharge Diagnoses:  Active Problems:   Acute respiratory failure   Discharge Condition: stable   Diet recommendation: heart healthy   Filed Weights   09/22/13 0523 09/23/13 0900 09/24/13 0700  Weight: 68.5 kg (151 lb 0.2 oz) 66.1 kg (145 lb 11.6 oz) 66.3 kg (146 lb 2.6 oz)    History of present illness:  58 y/o female with PMH of HTN and prior suicidal attempts found unresponsive and EMS was called  is admitted with benzodiazepine and alcohol overdose. Initially Intubated for airway protection/extubated 3/31;   Hospital Course:   1. Acute respiratory failure due to benzo/etoh overdose;  -resolved; initially intubated for airway protection/extubated 3/31;  2. Pneumonia ? Aspiration; CXR: Persistent focus of airspace disease in the left lower lobe;  -Sputum cultures: strep pneumonia  -afebrile; cont PO atx;  3. HTN resume ACE; added amlodipine; titrate per response as outpatient  4. Hypo K; replaced; cont home regimen; recheck in 1 week    Procedures:  nonen  (i.e. Studies not automatically included, echos, thoracentesis, etc; not x-rays)  Consultations:  Pulmonology   Discharge Exam: Filed Vitals:   09/24/13 1341  BP: 165/81  Pulse: 94  Temp: 98.7 F (37.1 C)  Resp: 18    General: alert Cardiovascular: s1,s2 rrr Respiratory: CTA BL  Discharge Instructions  Discharge Orders   Future Orders Complete By Expires   Diet - low sodium heart healthy  As directed    Discharge instructions  As directed    Comments:     Please follow up with primary care doctor in 1 week after discharge   Increase activity slowly  As directed        Medication List    STOP taking these  medications       ALPRAZolam 1 MG tablet  Commonly known as:  XANAX      TAKE these medications       albuterol 108 (90 BASE) MCG/ACT inhaler  Commonly known as:  PROVENTIL HFA;VENTOLIN HFA  Inhale 2 puffs into the lungs every 6 (six) hours as needed for wheezing or shortness of breath.     amLODipine 5 MG tablet  Commonly known as:  NORVASC  Take 1 tablet (5 mg total) by mouth daily.     Aspirin-Acetaminophen-Caffeine 260-130-16 MG Tabs  Take 1 packet by mouth 3 (three) times daily as needed (headache.).     DiphenhydrAMINE HCl (Sleep) 50 MG Caps  Take 1 capsule by mouth at bedtime as needed (sleep).     hydroxypropyl methylcellulose 2.5 % ophthalmic solution  Commonly known as:  ISOPTO TEARS  Place 1 drop into both eyes as needed for dry eyes.     levofloxacin 750 MG tablet  Commonly known as:  LEVAQUIN  Take 1 tablet (750 mg total) by mouth daily.     lisinopril 40 MG tablet  Commonly known as:  PRINIVIL,ZESTRIL  Take 1 tablet (40 mg total) by mouth every morning. For high blood pressure control     potassium chloride 10 MEQ tablet  Commonly known as:  K-DUR,KLOR-CON  Take 1 tablet (10 mEq total) by mouth daily. For potassium replacement     traZODone 50 MG tablet  Commonly known as:  DESYREL  Take 1  tablet (50 mg total) by mouth at bedtime as needed for sleep.       No Known Allergies     Follow-up Information   Follow up with MCLEAN, CAROLINE P, LCSW In 1 week.   Specialty:  Emergency Medicine   Contact information:   [facility] [place]        The results of significant diagnostics from this hospitalization (including imaging, microbiology, ancillary and laboratory) are listed below for reference.    Significant Diagnostic Studies: Ct Head Wo Contrast  09/20/2013   CLINICAL DATA:  Altered mental status following and overdose.  EXAM: CT HEAD WITHOUT CONTRAST  TECHNIQUE: Contiguous axial images were obtained from the base of the skull through the vertex  without intravenous contrast.  COMPARISON:  04/01/2011.  FINDINGS: The previously demonstrated small rounded area of high density in the anterior aspect of the third ventricle is unchanged, measuring 5 mm in maximum diameter currently and previously by my measurements. Stable mildly enlarged ventricles and subarachnoid spaces. No significant change in patchy white matter low density in both cerebral hemispheres. No intracranial hemorrhage, mass lesion or CT evidence of acute infarction. Mild bilateral ethmoid sinus mucosal thickening. Small right maxillary sinus retention cyst. Unremarkable bones.  IMPRESSION: 1. No acute abnormality. 2. Stable small third ventricular colloid cyst. 3. Stable atrophy and chronic small vessel white matter ischemic changes. 4. Minimal chronic bilateral ethmoid sinusitis.   Electronically Signed   By: Gordan Payment M.D.   On: 09/20/2013 17:36   Dg Chest Port 1 View  09/23/2013   CLINICAL DATA:  et  EXAM: PORTABLE CHEST - 1 VIEW  COMPARISON:  None.  FINDINGS: Interval extubation. There is improved aeration at the lung bases. . There is a persistent focus of airspace disease left lower lobe. No pneumothorax.  IMPRESSION: 1. Interval extubation with improvement in aeration at lung bases. 2. Persistent focus of airspace disease in the left lower lobe   Electronically Signed   By: Genevive Bi M.D.   On: 09/23/2013 07:10   Dg Chest Port 1 View  09/22/2013   CLINICAL DATA:  Aspiration pneumonia  EXAM: PORTABLE CHEST - 1 VIEW  COMPARISON:  09/21/2013  FINDINGS: Endotracheal tube is 2.5 cm above the carina. NG tube in the stomach.  Increase in left lower lobe airspace disease, likely atelectasis. Increase in mild right lower lobe atelectasis. No definite edema.  IMPRESSION: Endotracheal tube in good position.  Increase in atelectasis/infiltrate left greater than right.   Electronically Signed   By: Marlan Palau M.D.   On: 09/22/2013 07:27   Dg Chest Port 1 View  09/21/2013    CLINICAL DATA:  Endotracheal tube position.  EXAM: PORTABLE CHEST - 1 VIEW  COMPARISON:  DG CHEST 1V PORT dated 09/20/2013  FINDINGS: Lower endotracheal tube, now 16 mm above the carina. New orogastric tube which enters the stomach at least.  New opacification of the left lower lobe, obscuring the diaphragm and causing volume loss. There is likely subsegmental atelectasis in the right infrahilar lung. No evidence of pneumothorax. Normal heart size.  IMPRESSION: 1. Lower endotracheal tube, now 1.6 cm above the carina. 2. New orogastric tube in good position. 3. New left lower lobe atelectasis.   Electronically Signed   By: Tiburcio Pea M.D.   On: 09/21/2013 05:04   Dg Chest Portable 1 View  09/20/2013   CLINICAL DATA:  Intubation.  Drug overdose.  Altered mental status.  EXAM: PORTABLE CHEST - 1 VIEW  COMPARISON:  DG  CHEST 1V PORT dated 09/20/2013; DG CHEST 1V PORT dated 07/05/2012  FINDINGS: Endotracheal tube tip 35 mm from the carina. Basilar atelectasis is present. No airspace disease or pleural effusion. Monitoring leads project over the chest. Cardiopericardial silhouette appears within normal limits.  IMPRESSION: Interval intubation with endotracheal tube 35 mm from the carina. Basilar atelectasis.   Electronically Signed   By: Andreas NewportGeoffrey  Lamke M.D.   On: 09/20/2013 16:34   Dg Chest Portable 1 View  09/20/2013   CLINICAL DATA:  Altered mental status.  EXAM: PORTABLE CHEST - 1 VIEW  COMPARISON:  DG CHEST 1V PORT dated 07/05/2012; DG CHEST 2V dated 09/02/2011; CT ANGIO CHEST W/CM &/OR WO/CM dated 09/30/2006  FINDINGS: Airway thickening is present, suggesting bronchitis or reactive airways disease. No cardiomegaly or compelling findings of edema. No airspace opacity noted.  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 Upper normal heart size.  IMPRESSION: 1. Airway thickening is present, suggesting bronchitis or reactive airways disease.   Electronically Signed   By: Herbie BaltimoreWalt   Liebkemann M.D.   On: 09/20/2013 16:11    Microbiology: Recent Results (from the past 240 hour(s))  MRSA PCR SCREENING     Status: None   Collection Time    09/20/13  8:45 PM      Result Value Ref Range Status   MRSA by PCR NEGATIVE  NEGATIVE Final   Comment:            The GeneXpert MRSA Assay (FDA     approved for NASAL specimens     only), is one component of a     comprehensive MRSA colonization     surveillance program. It is not     intended to diagnose MRSA     infection nor to guide or     monitor treatment for     MRSA infections.  CULTURE, RESPIRATORY (NON-EXPECTORATED)     Status: None   Collection Time    09/21/13 10:50 AM      Result Value Ref Range Status   Specimen Description TRACHEAL ASPIRATE   Final   Special Requests NONE   Final   Gram Stain     Final   Value: ABUNDANT WBC PRESENT,BOTH PMN AND MONONUCLEAR     NO SQUAMOUS EPITHELIAL CELLS SEEN     MODERATE GRAM POSITIVE RODS     Performed at [facility]   Culture     Final   Value: MODERATE STREPTOCOCCUS PNEUMONIAE     Performed at [facility]   Report Status 09/23/2013 FINAL   Final   Organism ID, Bacteria STREPTOCOCCUS PNEUMONIAE   Final     Labs: Basic Metabolic Panel:  Recent Labs Lab 09/20/13 1641 09/21/13 0200 09/22/13 0310 09/23/13 0310  NA 135* 135* 137 137  K 3.9 3.7 3.6* 3.2*  CL 97 100 101 100  CO2 21 19 22 23   GLUCOSE 83 71 88 93  BUN 4* 5* 7 5*  CREATININE 0.50 0.57 0.51 0.51  CALCIUM 9.3 8.1* 8.6 9.0  MG  --  1.4*  --   --   PHOS  --  3.3  --   --    Liver Function Tests:  Recent Labs Lab 09/20/13 1641  AST 47*  ALT 36*  ALKPHOS 66  BILITOT 0.3  PROT 7.5  ALBUMIN 3.8   No results found for this basename: LIPASE, AMYLASE,  in the last 168 hours No results found for this basename: AMMONIA,  in the last 168 hours CBC:  Recent Labs Lab 09/20/13 1641 09/21/13 0200 09/22/13 0310 09/23/13 0310  WBC 5.1 8.4 15.2* 12.0*  NEUTROABS 1.5*  --   --    --   HGB 14.1 11.8* 12.8 11.7*  HCT 39.8 34.8* 37.6 33.0*  MCV 99.0 98.9 100.3* 100.0  PLT 138* 130* 120* 109*   Cardiac Enzymes:  Recent Labs Lab 09/20/13 1641 09/20/13 1942 09/21/13 0200  TROPONINI <0.30 <0.30 <0.30   BNP: BNP (last 3 results) No results found for this basename: PROBNP,  in the last 8760 hours CBG: No results found for this basename: GLUCAP,  in the last 168 hours     Signed:  Esperanza Sheets  [facility] 09/24/2013, 1:59 PM

## 2013-09-24 NOTE — Progress Notes (Signed)
[facility] PROGRESS NOTE  Brenda CaulDelana L Rice GLO:[id number] DOB: 02/25/1956 DOA: 09/20/2013 PCP: Thornell SartoriusMCLEAN, CAROLINE P, LCSW  Assessment/Plan:  58 y/o female with PMH of HTN and prior suicidal attempts found unresponsive and EMS was called is admitted with benzodiazepine and alcohol overdose. Initially Intubated for airway protection/extubaetd 3/31;   1. Acute respiratory failure due to benzo/etoh overdose;  -resolved; initially intubated for airway protection/extubaetd 3/31;  2. Pneumonia ? Aspiration; CXR: Persistent focus of airspace disease in the left lower lobe;  -Sputum cultures: strep pneumonia -afebrile; cont PO atx;   3.  HTN resume ACE; titrate per response    4. Hypo K; replaced; recheck in AM   Patient is medically stable for inpatient psychiatry admission;    Code Status: full Family Communication: d/w patient (indicate person spoken with, relationship, and if by phone, the number) Disposition Plan: inpatient psychiatry    Consultants:  PCCM, psychiatry   Procedures:  None   Antibiotics:  levofloxacin 3/31<<< (indicate start date, and stop date if known)  HPI/Subjective: alert  Objective: Filed Vitals:   09/24/13 0547  BP: 151/66  Pulse: 80  Temp: 98.7 F (37.1 C)  Resp: 18    Intake/Output Summary (Last 24 hours) at 09/24/13 1055 Last data filed at 09/23/13 1745  Gross per 24 hour  Intake    480 ml  Output    225 ml  Net    255 ml   Filed Weights   09/22/13 0523 09/23/13 0900 09/24/13 0700  Weight: 68.5 kg (151 lb 0.2 oz) 66.1 kg (145 lb 11.6 oz) 66.3 kg (146 lb 2.6 oz)    Exam:   General:  alert  Cardiovascular: s1,s2 rrr  Respiratory: CTA BL  Abdomen: soft, nt, nd   Musculoskeletal: no LE edema   Data Reviewed: Basic Metabolic Panel:  Recent Labs Lab 09/20/13 1641 09/21/13 0200 09/22/13 0310 09/23/13 0310  NA 135* 135* 137 137  K 3.9 3.7 3.6* 3.2*  CL 97 100 101 100  CO2 21 19 22 23   GLUCOSE 83 71 88 93  BUN  4* 5* 7 5*  CREATININE 0.50 0.57 0.51 0.51  CALCIUM 9.3 8.1* 8.6 9.0  MG  --  1.4*  --   --   PHOS  --  3.3  --   --    Liver Function Tests:  Recent Labs Lab 09/20/13 1641  AST 47*  ALT 36*  ALKPHOS 66  BILITOT 0.3  PROT 7.5  ALBUMIN 3.8   No results found for this basename: LIPASE, AMYLASE,  in the last 168 hours No results found for this basename: AMMONIA,  in the last 168 hours CBC:  Recent Labs Lab 09/20/13 1641 09/21/13 0200 09/22/13 0310 09/23/13 0310  WBC 5.1 8.4 15.2* 12.0*  NEUTROABS 1.5*  --   --   --   HGB 14.1 11.8* 12.8 11.7*  HCT 39.8 34.8* 37.6 33.0*  MCV 99.0 98.9 100.3* 100.0  PLT 138* 130* 120* 109*   Cardiac Enzymes:  Recent Labs Lab 09/20/13 1641 09/20/13 1942 09/21/13 0200  TROPONINI <0.30 <0.30 <0.30   BNP (last 3 results) No results found for this basename: PROBNP,  in the last 8760 hours CBG: No results found for this basename: GLUCAP,  in the last 168 hours  Recent Results (from the past 240 hour(s))  MRSA PCR SCREENING     Status: None   Collection Time    09/20/13  8:45 PM      Result Value Ref Range Status  MRSA by PCR NEGATIVE  NEGATIVE Final   Comment:            The GeneXpert MRSA Assay (FDA     approved for NASAL specimens     only), is one component of a     comprehensive MRSA colonization     surveillance program. It is not     intended to diagnose MRSA     infection nor to guide or     monitor treatment for     MRSA infections.  CULTURE, RESPIRATORY (NON-EXPECTORATED)     Status: None   Collection Time    09/21/13 10:50 AM      Result Value Ref Range Status   Specimen Description TRACHEAL ASPIRATE   Final   Special Requests NONE   Final   Gram Stain     Final   Value: ABUNDANT WBC PRESENT,BOTH PMN AND MONONUCLEAR     NO SQUAMOUS EPITHELIAL CELLS SEEN     MODERATE GRAM POSITIVE RODS     Performed at [facility]   Culture     Final   Value: MODERATE STREPTOCOCCUS PNEUMONIAE     Performed at  [facility]   Report Status 09/23/2013 FINAL   Final   Organism ID, Bacteria STREPTOCOCCUS PNEUMONIAE   Final     Studies: Dg Chest Port 1 View  09/23/2013   CLINICAL DATA:  et  EXAM: PORTABLE CHEST - 1 VIEW  COMPARISON:  None.  FINDINGS: Interval extubation. There is improved aeration at the lung bases. . There is a persistent focus of airspace disease left lower lobe. No pneumothorax.  IMPRESSION: 1. Interval extubation with improvement in aeration at lung bases. 2. Persistent focus of airspace disease in the left lower lobe   Electronically Signed   By: Genevive Bi M.D.   On: 09/23/2013 07:10    Scheduled Meds: . enoxaparin (LOVENOX) injection  40 mg Subcutaneous Q24H  . [START ON 09/25/2013] folic acid  1 mg Oral Daily  . levofloxacin  750 mg Oral Q24H  . lisinopril  40 mg Oral Daily  . [START ON 09/25/2013] thiamine  100 mg Oral Daily  . traZODone  50 mg Oral QHS   Continuous Infusions:   Active Problems:   Acute respiratory failure    Time spent: >35 minutes     Esperanza Sheets  [facility] Pager [phone]. If 7PM-7AM, please contact night-coverage at [email or web address], password [facility] 09/24/2013, 10:55 AM  LOS: 4 days

## 2013-09-24 NOTE — Progress Notes (Signed)
Clinical Social Work  Per MD, patient medically stable for DC. Patient aware and agreeable to plans for inpatient placement. The following facilities were contacted:  [facility]- [facility] beds. Referral faxed.  [facility] who reports no available beds at this time but encouraged CSW to call back this afternoon to determine if any available beds.  [facility]- no available beds  [facility]- reports several referrals this morning but stated CSW could send referral  [facility]- available beds. Referral faxed.  CSW will continue to follow.  [place] My[place], [place] [phone]

## 2013-09-24 NOTE — Progress Notes (Signed)
Discharge instructions accompanied pt, [facility].

## 2013-09-24 NOTE — Progress Notes (Signed)
Clinical Social Work  Patient accepted to [facility] by Dr. Dareen Piano. RN to call report to [phone]. CSW met with patient at bedside and explained that patient was accepted to [facility]. Patient agreeable to plans and has facility name and number. Patient requested that [place] call her husband and update him on plans. CSW contacted husband and provided information for him as well. CSW coordinated transportation via [employer].   CSW is signing off but available if further needs arise.  [place], [place] [phone]

## 2013-09-24 NOTE — Progress Notes (Signed)
Clinical Social Work Department CLINICAL SOCIAL WORK PSYCHIATRY SERVICE LINE ASSESSMENT 09/24/2013  Patient:  Brenda Rice [facility]  Account:  [id number]  Admit Date:  09/20/2013  Clinical Social Worker:  Sindy Messing, LCSW  Date/Time:  09/24/2013 11:45 AM Referred by:  Physician  Date referred:  09/24/2013 Reason for Referral  Psychosocial assessment   Presenting Symptoms/Problems (In the person's/family's own words):   Psych consulted due to overdose.   Abuse/Neglect/Trauma History (check all that apply)  Denies history   Abuse/Neglect/Trauma Comments:   Psychiatric History (check all that apply)  Outpatient treatment  Inpatient/hospitilization   Psychiatric medications:  Trazodone 50 mg   Current Mental Health Hospitalizations/Previous Mental Health History:   Patient reports she was diagnosed with depression but has not had any outpatient follow up. Patient reports that she has been hospitalized in the past due to suicide attempts and depression. Patient does acknowledge that she needs continued outpatient follow up in the future.   Current provider:   None   Place and Date:   N/A   Current Medications:   Scheduled Meds:      . amLODipine  5 mg Oral Daily  . enoxaparin (LOVENOX) injection  40 mg Subcutaneous Q24H  . [START ON 7/0/1779] folic acid  1 mg Oral Daily  . levofloxacin  750 mg Oral Q24H  . lisinopril  40 mg Oral Daily  . potassium chloride  40 mEq Oral Daily  . [START ON 09/25/2013] thiamine  100 mg Oral Daily  . traZODone  50 mg Oral QHS        Continuous Infusions:      PRN Meds:.sodium chloride, acetaminophen       Previous Impatient Admission/Date/Reason:   Patient's last hospital stay was at [facility] about 4 months ago. Patient has been at [facility] in 2012 and 2013 as well.   Emotional Health / Current Symptoms    Suicide/Self Harm  Suicide attempt in past (date/description)   Suicide attempt in the past:   Patient admitted after drinking alcohol and attempted to  overdose on medication. Patient has had suicide attempts in the past as well. Patient denies any current SI or HI.   Other harmful behavior:   None reported   Psychotic/Dissociative Symptoms  None reported   Other Psychotic/Dissociative Symptoms:    Attention/Behavioral Symptoms  Within Normal Limits   Other Attention / Behavioral Symptoms:   Patient engaged during assessment.    Cognitive Impairment  Within Normal Limits   Other Cognitive Impairment:   Patient alert and oriented.    Mood and Adjustment  DEPRESSION    Stress, Anxiety, Trauma, Any Recent Loss/Stressor  Relationship   Anxiety (frequency):   N/A   Phobia (specify):   N/A   Compulsive behavior (specify):   N/A   Obsessive behavior (specify):   N/A   Other:   Patient reports husband is cheating on her. Patient reports she has been working overtime in order to assist with supporting son and dtr-in-law.   Substance Abuse/Use  Current substance use   SBIRT completed (please refer for detailed history):  Y  Self-reported substance use:   Patient reports she used to be a heavy drinker. Patient reports that she realized that her alcohol use was harmful and stopped drinking as much. Patient is vague on her consumption but reports that she usually only drinks when she is stressed out. Patient reports that she is not interested in substance abuse treatment but is agreeable to discuss positive coping skills.  Urinary Drug Screen Completed:  Y Alcohol level:   332    Environmental/Housing/Living Arrangement  Stable housing   Who is in the home:   Husband   Emergency contact:  [employer]   Patient's Strengths and Goals (patient's own words):   Patient agreeable to treatment and reports supportive friend to assist as needed.   Clinical Social Worker's Interpretive Summary:   CSW received referral in order to complete psychosocial assessment. CSW reviewed  chart and met with patient at bedside. CSW introduced myself and explained role.    Patient reports that she was at home with husband but was feeling overwhelmed. Patient reports that she and husband are having marital problems. Patient also reports that dtr-in-law lost her job so she and her son have been working overtime in order to assist financially. Patient states that she was having a bad day so she drank alcohol and overdosed on medication.    Patient reports that this is not her first attempt and that she was recently at [facility] for an overdose attempt. Patient reports a long history of depression but has not had any follow up since leaving [facility]. Patient reports that she now understands the importance of constant follow up and plans on using E[employer] program through work and finding a psychiatrist. Patient works for [employer] and report she has a Nature conservation officer that she can see. Patient aware of psych MD recommendation for inpatient treatment and agreeable to plans.    Patient guarded when discussing substance use. Patient agreeable to complete SBIRT but appears to minimize alcohol use. Patient does not feel that direct treatment for substance use is required but feels that depression does correlate to substance use. Patient alert and oriented during assessment but tearful at times.     Patient aware of DC to [facility] today and is agreeable.   Disposition:  Inpatient referral made [facility], [facility], [place])   [place], [place] [phone]

## 2013-09-24 NOTE — Progress Notes (Signed)
NUTRITION FOLLOW UP  Intervention:   - Encouraged small frequent meals to improve appetite - Will order snacks in between meals and continue to monitor   Nutrition Dx:  Inadequate oral intake related to inability to eat as evidenced by NPO, mechanical ventilation - ongoing but now related to poor appetite.    Goal:   Diet advancement with goal for pt to consume >90% of meals - diet advanced however pt not eating >90% of meals   Monitor:   Weights, labs, intake  Assessment:   Pt with PMH relevant for HTN and prior suicidal attempts. Was found unresponsive and EMS was called. There were empty bottles of Xanax and the patient had alcohol breath. She was intubated for airway protection.   3/30 - Alone in room. Plan is for possible extubation today.   4/2 - Extubated 3/31. Diet advanced to heart healthy 3/31. Met with pt, sitter in room. Pt reports PTA she had a good appetite and was eating well, 2 meals/day. Works 10 hour days. C/o poor appetite since admission with pt only eating 50% of meals. Not interested in nutritional supplements.   Potassium low, getting IV replacement  Height: Ht Readings from Last 1 Encounters:  09/20/13 5' 5"  (1.651 m)    Weight Status:   Wt Readings from Last 1 Encounters:  09/24/13 146 lb 2.6 oz (66.3 kg)  Admit wt:        146 lb (66.2 kg)  Re-estimated needs:  Kcal: 1650-1850 Protein: 80-90g Fluid: 1.6-1.8L/day  Skin: Old healing sacral wound   Diet Order: Cardiac   Intake/Output Summary (Last 24 hours) at 09/24/13 1202 Last data filed at 09/23/13 1745  Gross per 24 hour  Intake    480 ml  Output      0 ml  Net    480 ml    Last BM: 3/31   Labs:   Recent Labs Lab 09/20/13 1641 09/21/13 0200 09/22/13 0310 09/23/13 0310  NA 135* 135* 137 137  K 3.9 3.7 3.6* 3.2*  CL 97 100 101 100  CO2 21 19 22 23   BUN 4* 5* 7 5*  CREATININE 0.50 0.57 0.51 0.51  CALCIUM 9.3 8.1* 8.6 9.0  MG  --  1.4*  --   --   PHOS  --  3.3  --   --    GLUCOSE 83 71 88 93    CBG (last 3)  No results found for this basename: GLUCAP,  in the last 72 hours  Scheduled Meds: . amLODipine  5 mg Oral Daily  . enoxaparin (LOVENOX) injection  40 mg Subcutaneous Q24H  . [START ON 03/02/4209] folic acid  1 mg Oral Daily  . levofloxacin  750 mg Oral Q24H  . lisinopril  40 mg Oral Daily  . potassium chloride  40 mEq Oral Daily  . [START ON 09/25/2013] thiamine  100 mg Oral Daily  . traZODone  50 mg Oral QHS     Mikey College MS, RD, [place] [phone] Pager [phone] After Hours Pager

## 2013-09-24 NOTE — Progress Notes (Signed)
PHARMACY - BRIEF NOTE  This patient is receiving FOLATE AND THIAMINE. Based on criteria approved by the Pharmacy and Therapeutics Committee, this medication is being converted to the equivalent oral dose form. These criteria include:   . The patient is eating (either orally or per tube) and/or has been taking other orally administered medications for at least 24 hours.  . This patient has no evidence of active gastrointestinal bleeding or impaired GI absorption (gastrectomy, short bowel, patient on TNA or NPO).   If you have questions about this conversion, please contact the pharmacy department.  Juliette Alcideustin Beverely Suen, PharmD, BCPS.   Pager: [phone] Pharmacy phone: [phone]  09/24/2013 9:51 AM

## 2013-10-06 ENCOUNTER — Ambulatory Visit (INDEPENDENT_AMBULATORY_CARE_PROVIDER_SITE_OTHER): Payer: Managed Care, Other (non HMO) | Admitting: Psychiatry

## 2013-10-06 ENCOUNTER — Encounter (INDEPENDENT_AMBULATORY_CARE_PROVIDER_SITE_OTHER): Payer: Self-pay

## 2013-10-06 ENCOUNTER — Encounter (HOSPITAL_COMMUNITY): Payer: Self-pay | Admitting: Psychiatry

## 2013-10-06 VITALS — BP 151/106 | HR 111 | Ht 62.0 in | Wt 137.0 lb

## 2013-10-06 DIAGNOSIS — F332 Major depressive disorder, recurrent severe without psychotic features: Secondary | ICD-10-CM

## 2013-10-06 DIAGNOSIS — F1994 Other psychoactive substance use, unspecified with psychoactive substance-induced mood disorder: Secondary | ICD-10-CM

## 2013-10-06 DIAGNOSIS — F329 Major depressive disorder, single episode, unspecified: Secondary | ICD-10-CM | POA: Insufficient documentation

## 2013-10-06 DIAGNOSIS — F1310 Sedative, hypnotic or anxiolytic abuse, uncomplicated: Secondary | ICD-10-CM

## 2013-10-06 DIAGNOSIS — F1020 Alcohol dependence, uncomplicated: Secondary | ICD-10-CM

## 2013-10-06 NOTE — Progress Notes (Signed)
Psychiatric Assessment Adult  Patient Identification:  Brenda Rice Date of Evaluation:  10/06/2013 Chief Complaint: overwhelmed History of Chief Complaint:   Chief Complaint  Patient presents with  . Depression    HPI Comments: "I have just kind of given up. I don't want to go to work or leave my house. I am just ready to give up. I just want my life get better. I don't wan to kill myself. The harder I try the worse it gets."  Current stressors include- financial burden of caring for household since husband's hours were cut back; work has become stressful due to increased responsibility. Thought husband was cheating on her with neighbor (now knows it was not true). Pt didn't initially mention it but when husband reminded her she agreed the financial stress of helping her daughter in law (recently lost her job) is also contributing.  Pt reports husband brought pt to ED on 09/21/13 b/c he was concerned that she overdoses. States it was not a suicide attempt and she can't recall what she took or how many. Later while talking with husband she stated she took 10-15 Xanax. States she was looking for help and relief but she didn't want to die. She regrets taking the pills. Was admitted to [facility] where she was detoxed from alcohol and started on Celexa. States she was treated from 3/30-09/25/13 and admission was not helpful. Was d/c to [facility] and continued treatment with Celexa and Seroquel. Has been on both meds in the past and neither has been helpful. Felt [facility] was minorly helpful. Yesterday saw a therapist named Stevphen Rochester and plans to continue. Has not taken Celexa or Seroquel in 2 weeks and not sure if she plans to refill meds.  Later stated she took the medication until d/c and has been off of both meds for the last one week. She has decided to refill the prescription and take them for 1 month to see if there is any effect.   Reports she is depressed. Energy is poor.  Reports Low  motivation. Sleep is poor- taking 25 mg of Benadryl every night and getting about 2-3 hrs/night. Rest of the night she walks around and watches TV. Doesn't nap during the day. Takes her husblnd's Xanax everyday, once/day- calms her down and she can think clearly and cook dinner. Otherwise has racing thoughts. Appetite is poor and she is eating about 1/meal day. Has not worked in 2 weeks and is currently on medical leave. Has not scheduled return date. Concentration and memory are poor. Endorsing on/off hopelessness and worthlessness. Feeling overwhelmed. States she doubts herself and doesn't like herself.  Denies SI/HI. Motivation is low and she often spends time doing nothing. Has not been wearing makeup or curling her hair as she usually does. Endorsing anhedonia and frequent, random crying spells. States she doesn't laugh much. Endorsing some isolation and is withdrawn from husband a little. Symptoms have been going on for about 1 month and worse over 2 last weeks. Feels that decrease in alcohol has made symptoms worse.   States initially was drinking 2 beers when confronted changed her statement multiple times about how much was drinking. Would not provide clear answer about how much or how often she was drinking or how that lead to detox. No plans to stop drinking daily. Feels comfortable drinking 2 beers/day.Taking 1/2 Xanax for last one month and his pills are about to run out. Very concerned about losing Xanax.   Per husband- memory  is poor for last 3-4 days (passwords, new events and activities, getting bills paid on time). On 3/30 pt called 911 for ambulance after she took a bunch of Xanax (maybe 30-40 or more). They had been drinking and husband was unaware of pill OD. Stressors include finances with son (DIL recently fired and they are helping). Believes it was a cry for help at the time she was intoxicated. That time she was drinking 8 beers and 5 shots. Now drinking 2 beers/night. Prior to that  night she was drinking 5 beers/night. Doesn't think it was suicide attempt. States pt has been depressed due to stressors. Reports low motivation and in her head a lot. Pt is taking 1/2 Xanax 2x/day.  Husband feels safe with pt.   Review of Systems  Constitutional: Positive for appetite change and fatigue.  HENT: Negative.   Eyes: Negative.   Respiratory: Negative.   Cardiovascular: Negative.   Gastrointestinal: Negative.   Musculoskeletal: Negative.   Neurological: Negative.   Psychiatric/Behavioral: Positive for sleep disturbance and dysphoric mood.   Physical Exam  Psychiatric: Her speech is normal. Judgment and thought content normal. She is slowed. She exhibits a depressed mood. She exhibits abnormal recent memory.    Depressive Symptoms: see HPI  (Hypo) Manic Symptoms:   Elevated Mood:  No Irritable Mood:  No Grandiosity:  No Distractibility:  No Labiality of Mood:  No Delusions:  No Hallucinations:  No Impulsivity:  No Sexually Inappropriate Behavior:  No Financial Extravagance:  No Flight of Ideas:  No  Anxiety Symptoms: Excessive Worry:  No. Worry 2-3 days/week for 4 hrs/day. Denies insomnia, lack of energy, FOI or GI upset. Usually able to step back and get under control. When confronted about Xanax use helping her to calm down and think pt still denied anxiety Panic Symptoms:  Last time was 4 months ago. Tunnel vision, palpitations, tremor, faint, SOB. Goes away quickly on its own after few minutes. Comes with stress.  Agoraphobia:  No Obsessive Compulsive: No  Symptoms: None, Specific Phobias:  No Social Anxiety:  No  Psychotic Symptoms:  Hallucinations: No None Delusions:  No Paranoia:  No   Ideas of Reference:  No  PTSD Symptoms: Ever had a traumatic exposure:  No Had a traumatic exposure in the last month:  No Re-experiencing: No None Hypervigilance:  No Hyperarousal: No None Avoidance: No None  Traumatic Brain Injury: No none   Past  Psychiatric History: Diagnosis:MDD, Alcohol dependence. Chart review shows Benzo use disorder and GAD.  Hospitalizations: 2 [facility] - 15 yrs ago and 3/30-09/25/13 for detox and depression. Chart review shows previous admission in 2003 and 2012 and IVC in 2014 and again 05/2013 after suicide attempt by OD  Outpatient Care: saw psychiatrist once 10 yrs ago.  Substance Abuse Care: detox 2x :15 yrs ago place unknown and [facility] 09/24/13-09/28/13  Self-Mutilation: denies  Suicidal Attempts: yes 15 yrs ago had a suicide attempt. She impulsively took pills b/c she thought husband was cheating on her. Was admitted [facility] treatment. Chart review shows multiple reported SA by OD over the years.  Violent Behaviors: denies   Past Medical History:   Past Medical History  Diagnosis Date  . Hypertension   . Depression   . Suicide attempt     x 3  . Osteoarthritis     right hip  . Fx clavicle     Hx of left clavicle fx  . Hx: UTI (urinary tract infection)    History  of Loss of Consciousness:  No Seizure History:  No Cardiac History:  No Allergies:  No Known Allergies Current Medications:  Current Outpatient Prescriptions  Medication Sig Dispense Refill  . Aspirin-Acetaminophen-Caffeine 260-130-16 MG TABS Take 1 packet by mouth 3 (three) times daily as needed (headache.).      Marland Kitchen. lisinopril (PRINIVIL,ZESTRIL) 40 MG tablet Take 1 tablet (40 mg total) by mouth every morning. For high blood pressure control      . citalopram (CELEXA) 20 MG tablet Take 20 mg by mouth daily.      . QUEtiapine (SEROQUEL) 50 MG tablet Take 50 mg by mouth at bedtime.       No current facility-administered medications for this visit.    Previous Psychotropic Medications:  Medication Dose   Celexa   Seroquel   Xanax   Per chart review- Cymbalta and Trazodone             Substance Abuse History in the last 12 months: Substance Age of 1st Use Last Use Amount Specific Type  Nicotine  14  today  1ppd  cigs   Alcohol  16  today  1   beer  Cannabis  denies      Opiates  denies     Cocaine  denies     Methamphetamines  denies      LSD  denies     Ecstasy  denies     Benzodiazepines  33  yesterday 0.5mg  once/day Xanax  Caffeine    today  2-3 cuts of coffee/day. 1 diet pepsi at lunch and dinner.    Inhalants  denies     Others:                          Medical Consequences of Substance Abuse: denies  Legal Consequences of Substance Abuse: denies  Family Consequences of Substance Abuse: seperated from husband years ago for several months due to alcohol use  Blackouts:  No DT's:  No Withdrawal Symptoms:  Yes Nausea. Unable to say when it happens or how often.  Social History: Current Place [place] Residence[place] Place of Birth: [place] raised [place] Family Members: husband Marital Status:  Married for 42 yrs Children: 1  Sons: 40yo, lives in [place]. See him occassionally  Daughters: denies Relationships: denies Education:  HS Print production plannerGraduate Educational Problems/Performance: did well in school, Scientific laboratory technicianreal estate license.  Religious Beliefs/Practices: none History of Abuse: none Occupational Experiences- works for [employer] for last 8 yrs Military History:  None. Legal History: denies Hobbies/Interests: unknown  Family History:  History reviewed. No pertinent family history.  Mental Status Examination/Evaluation: Objective:  Appearance: Casual  Eye Contact::  Good  Speech:  Clear and Coherent  Volume:  Normal  Mood:  depressed  Affect:  Congruent and Depressed  Thought Process:  Tangential  Orientation:  Full (Time, Place, and Person)  Thought Content:  Rumination and Xanax prescription  Suicidal Thoughts:  No  Homicidal Thoughts:  No  Judgement:  Fair  Insight:  Fair  Psychomotor Activity:  Normal  Akathisia:  No  Handed:  Right  AIMS (if indicated):  Not taking Seroquel, N/A  Assets:  Financial Resources/Insurance Housing Social Support Transportation   Malayiah Donell BeersL Kammerer is  a 58 y.o. female in treatment for depression and alcohol dependence and displays the following risk factors for Suicide:  Demographic factors:  Caucasian Current Mental Status: denies SI Loss Factors: Financial problems/change in socioeconomic status Historical Factors:  Prior suicide attempts and Impulsivity Risk Reduction Factors: Sense of responsibility to family and Employed  CLINICAL FACTORS:  Depression:   Anhedonia Comorbid alcohol abuse/dependence Hopelessness Insomnia Alcohol/Substance Abuse/Dependencies  COGNITIVE FEATURES THAT CONTRIBUTE TO RISK: Closed-mindedness    SUICIDE RISK:  Mild:  Suicidal ideation of limited frequency, intensity, duration, and specificity.  There are no identifiable plans, no associated intent, mild dysphoria and related symptoms, good self-control (both objective and subjective assessment), few other risk factors, and identifiable protective factors, including available and accessible social support.    Laboratory/X-Ray Psychological Evaluation(s)   reviewed March 29-Aprol 1, 2015 labs- showing signs of anemia, low K. UDS + benzos. Head CT- 09/20/2013 WNL  reports hx of psychological testing at least 10 yrs ago. Reason unknown.    Assessment:  Axis I: Major Depression, Recurrent severe, Substance Induced Mood Disorder and Alcohol dependence; Benzo use disorder  AXIS I Axis I: Major Depression, Recurrent severe, Substance Induced Mood Disorder and Alcohol dependence, Benzo use disorder  AXIS II Deferred  AXIS III Past Medical History  Diagnosis Date  . Hypertension   . Depression   . Suicide attempt     x 3  . Osteoarthritis     right hip  . Fx clavicle     Hx of left clavicle fx  . Hx: UTI (urinary tract infection)      AXIS IV economic problems, occupational problems and other psychosocial or environmental problems  AXIS V 51-60 moderate symptoms   Treatment Plan/Recommendations:  Plan of Care:  Continue meds, risks/benefits and SE of  the medication discussed. Pt verbalized understanding and verbal consent obtained for treatment. Pt states she does not want meds changed at this time and will try meds for at least one month.   Laboratory:  reviewed labs from May 29-September 23, 2013. No new labs ordered at this time.  Psychotherapy: continue with Ms. Stevphen RochesterNancy Bell  Medications: Continue Celexa 20mg  po qHS and Seroquel 50mg  po qHS. D/c Xanax and will not prescribe more due to hx of alcohol dependence. D/C Diphenhydramine  Routine PRN Medications:  No  Consultations: none at this time  Safety Concerns:  Pt denies SI and is at an acute low risk for suicide. Crisis plan reviewed and discussed. Pt verbalized understanding.   Other:  F/up in 6 weeks or sooner if needed.    Oletta DarterAGARWAL, Janautica Netzley, MD 4/14/201512:02 PM

## 2013-10-06 NOTE — Patient Instructions (Addendum)
Medications: Continue Celexa 20mg  at bedtime and Seroquel 50mg  at bedtime.  Discontinue Xanax and OTC Sleep aid.

## 2013-10-22 ENCOUNTER — Ambulatory Visit (INDEPENDENT_AMBULATORY_CARE_PROVIDER_SITE_OTHER): Payer: Managed Care, Other (non HMO) | Admitting: Psychiatry

## 2013-10-22 ENCOUNTER — Encounter (HOSPITAL_COMMUNITY): Payer: Self-pay | Admitting: Psychiatry

## 2013-10-22 VITALS — BP 161/95 | HR 88 | Ht 62.5 in | Wt 141.0 lb

## 2013-10-22 DIAGNOSIS — F332 Major depressive disorder, recurrent severe without psychotic features: Secondary | ICD-10-CM

## 2013-10-22 DIAGNOSIS — F1994 Other psychoactive substance use, unspecified with psychoactive substance-induced mood disorder: Secondary | ICD-10-CM

## 2013-10-22 DIAGNOSIS — F1310 Sedative, hypnotic or anxiolytic abuse, uncomplicated: Secondary | ICD-10-CM

## 2013-10-22 DIAGNOSIS — F411 Generalized anxiety disorder: Secondary | ICD-10-CM

## 2013-10-22 DIAGNOSIS — F329 Major depressive disorder, single episode, unspecified: Secondary | ICD-10-CM

## 2013-10-22 DIAGNOSIS — F1020 Alcohol dependence, uncomplicated: Secondary | ICD-10-CM

## 2013-10-22 MED ORDER — CITALOPRAM HYDROBROMIDE 20 MG PO TABS: 20.0000 mg | ORAL_TABLET | Freq: Every day | ORAL | Status: DC

## 2013-10-22 MED ORDER — QUETIAPINE FUMARATE 50 MG PO TABS: 50.0000 mg | ORAL_TABLET | Freq: Every day | ORAL | Status: DC

## 2013-10-22 NOTE — Progress Notes (Signed)
[facility] [id number] Progress Note  Brenda Rice [id number] 58 y.o.  10/22/2013 9:35 AM  Chief Complaint: needs refills  History of Present Illness: Reports she has been attending therapy and finds it helpful. Working on putting herself first instead of everyone else. Notes mood is improving slowly. Still having problems with motivation and maintaining her usual level of hygeine. States she is crying less and is doing a little more around the house. Appetite is improving and she has cooked some this week. Worthlessness and hopelessness are less severe.  Meds help her fall asleep but she still wakes up at least 2x/night. Meds makes are making her tired. No longer taking Xanax or Benadryl. Pt states she does not have refills on Celexa and Seroquel and ran out of Seroquel 2 days ago.  FMLA ends on May 12. Concerned about her short term memory. Memory problems began a few weeks prior to admission. Now finds it difficult to concentrate and notes lack of attention to detail. Pt is able to remember things from years ago but can't recall recent events. Today states she can't remember her passwords and log in. Pt does think her memory is slowly improving but wishes it was faster than before. States if she prompted or really makes an effort then she can recall details which is an improvement as compared to several weeks ago.    Husband has been very supportive.    Suicidal Ideation: No Plan Formed: No Patient has means to carry out plan: NA  Homicidal Ideation: No Plan Formed: No Patient has means to carry out plan: NA  Review of Systems: Psychiatric: Agitation: No Hallucination: No Depressed Mood: Yes Insomnia: yes see HPI Hypersomnia: Yes wakes up tired and takes naps during day Altered Concentration: Yes Feels Worthless: Yes on/off but less severe than before. Grandiose Ideas: No Belief In Special Powers: No New/Increased Substance Abuse: No- denies using Xanax since last visit.  States she is drinking 1/2 can or less of beer daily. Compulsions: No  Neurologic: Headache: Has been having constant headaches for last 4 yrs. Takes Tylenol and HA gets better. Seizure: No Paresthesias: No  Review of Systems  Constitutional: Negative.   Eyes: Negative.   Respiratory: Negative.   Cardiovascular: Negative.   Gastrointestinal: Negative.   Neurological: Positive for headaches.  Psychiatric/Behavioral: Positive for depression and memory loss.      Past Medical Family: hypertension, osteoarthritis, history of urinary tract infections, headaches  Social History: born [place] her husband. They've been married for 42 years and have one son who also lives in [place] who she sees occasionally. 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 Denies any military history and legal problems.   Substance abuse Hx: currently smoking one pack of cigarettes per day. Has been smoking since the age of 58. Began drinking alcohol at the age of 58. Was unable to give a clear history of how much she was drinking at the last visit. Detox 2x :15 yrs [facility] place unknown and [facility]. Today states that she is drinking half a can of beer or less per day. Also reports that she began taking benzodiazepines at the age of 58. Stated she was taking 0.5mg  of Xanax per day. These were her husband's medications.  Today states that she is not taking Xanax or any other  benzodiazepines for the last 2 weeks. Drinks 2-3 cups of coffee per day and one diet Pepsi at lunch and dinner.  Outpatient Encounter Prescriptions as of 10/22/2013  Medication Sig  . Aspirin-Acetaminophen-Caffeine 260-130-16 MG TABS Take 1 packet by mouth 3 (three) times daily as needed (headache.).  Marland Kitchen citalopram (CELEXA) 20 MG tablet Take 1 tablet (20 mg total) by mouth daily.   Marland Kitchen lisinopril (PRINIVIL,ZESTRIL) 40 MG tablet Take 1 tablet (40 mg total) by mouth every morning. For high blood pressure control  . QUEtiapine (SEROQUEL) 50 MG tablet Take 1 tablet (50 mg total) by mouth at bedtime.  . [DISCONTINUED] citalopram (CELEXA) 20 MG tablet Take 20 mg by mouth daily.  . [DISCONTINUED] QUEtiapine (SEROQUEL) 50 MG tablet Take 50 mg by mouth at bedtime.    Past Psychiatric History/Hospitalization(s): Anxiety: Yes Bipolar Disorder: No Depression: Yes Mania: No Psychosis: No Schizophrenia: No Personality Disorder: No Hospitalization for psychiatric illness: Yes Prior Psychotropic Medications: Celexa, Seroquel, Xanax, Cymbalta, Trazodone History of Electroconvulsive Shock Therapy: No Prior Suicide Attempts:  yes 15 yrs ago had a suicide attempt. She impulsively took pills b/c she thought husband was cheating on her. Was [facility]. Chart review shows multiple reported SA by OD over the years.   Physical Exam: Constitutional:  BP 161/95  Pulse 88  Ht 5' 2.5" (1.588 m)  Wt 141 lb (63.957 kg)  BMI 25.36 kg/m2  General Appearance: alert, oriented, no acute distress and well nourished  Musculoskeletal: Strength & Muscle Tone: within normal limits Gait & Station: normal Patient leans: N/A  Psychiatric: Speech (describe rate, volume, coherence, spontaneity, and abnormalities if any): speech is clear and coherent,  volume and rate are normal. spontaneous  Thought Process (describe rate, content, abstract reasoning, and computation): linear and logical, goal directed  Associations: Intact  Thoughts: normal  Mental Status:  Patient is a 58 year old Caucasian female who appears to be her stated age. Pt is calm and cooperative, in no apparent distress. She is fairly groomed with good hygiene. Eye contact is good. Speech is spontaneous normal rate and volume. Speech is clear and coherent. Psychomotor activity is within normal range  without akathisia or psychomotor agitation or retardation. Mood is depressed affect is congruent. She appears brighter today than at previous appointments. Thought processes are linear and logical, goal directed. She is alert and oriented to time person and place. Thought content patient denies suicidal and homicidal ideations denied auditory and visual hallucinations and paranoia as well as grandiose thoughts. Short-term memory is 3 out of 3.Cconcentration is good she is able spell the word world backwards and forwards correctly.  on serial sevens [id number]. Insight and judgment are good.  Medical Decision Making (Choose Three): Established Problem, Stable/Improving (1), Review or order clinical lab tests (1), Review and summation of old records (2) and Order AIMS Test (2)   Assessment: Axis I: Major Depression, Recurrent severe with anxiety; Substance Induced Mood Disorder and Alcohol dependence, Benzo use disorder   Axis II: deferred  Axis III: Hypertension, OA, Hx of UTI, Headaches  Axis IV: economic problems, occupational problems  Axis V: 60   Treatment Plan/Recommendations:  Plan of Care: Continue meds, risks/benefits and SE of the medication discussed. Pt verbalized understanding and verbal consent obtained for treatment. Pt states she does not want meds changed at this time and will try meds for at least one month.   Laboratory: reviewed labs from May 29-September 23, 2013. No new labs ordered at this time.  Psychotherapy: continue with Ms. Stevphen RochesterNancy Bell   Medications: Continue Celexa 20mg  po qHS and Seroquel 50mg  po qHS. D/c Xanax and will not prescribe more due to hx of alcohol dependence.  Routine PRN Medications: No   Consultations: none at this time   Safety Concerns: Pt denies SI and is at an acute low risk for suicide. Crisis plan reviewed and discussed. Pt verbalized understanding.   Other: F/up in 6 weeks or sooner if needed.      Oletta DarterAGARWAL, Edwardine Deschepper,  MD 10/22/2013

## 2013-10-27 ENCOUNTER — Telehealth: Payer: Self-pay

## 2013-10-27 NOTE — Telephone Encounter (Signed)
Spoke to CorneliusMaria she states that patient has already been taken for phycological evaluation and was in the hospital for two weeks at that time.  Has since been abusing the medication.

## 2013-10-27 NOTE — Telephone Encounter (Signed)
I would like patient to go directly to [facility] ED and request psychological evaluation

## 2013-10-27 NOTE — Telephone Encounter (Signed)
MARIA WHO IS THE NIECE OF PT WOULD LIKE TO SPEAK WITH THE DR'S ASSIST ABOUT A COMPLAINT, DIDN'T WANT TO SPEAK WITH AN OFFICE MANAGER,  PLEASE CALL [phone]

## 2013-10-27 NOTE — Telephone Encounter (Signed)
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  She states that patient has been suicidal in the past and requests your advice.

## 2013-10-28 ENCOUNTER — Emergency Department (HOSPITAL_COMMUNITY)
Admission: EM | Admit: 2013-10-28 | Discharge: 2013-10-28 | Disposition: A | Discharge status: Home / Self Care | Payer: Managed Care, Other (non HMO) | Attending: Emergency Medicine | Admitting: Emergency Medicine

## 2013-10-28 ENCOUNTER — Emergency Department (HOSPITAL_COMMUNITY): Payer: Managed Care, Other (non HMO)

## 2013-10-28 ENCOUNTER — Encounter (HOSPITAL_COMMUNITY): Payer: Self-pay | Admitting: Emergency Medicine

## 2013-10-28 DIAGNOSIS — Z87440 Personal history of urinary (tract) infections: Secondary | ICD-10-CM | POA: Insufficient documentation

## 2013-10-28 DIAGNOSIS — Z8781 Personal history of (healed) traumatic fracture: Secondary | ICD-10-CM | POA: Insufficient documentation

## 2013-10-28 DIAGNOSIS — F3289 Other specified depressive episodes: Secondary | ICD-10-CM | POA: Insufficient documentation

## 2013-10-28 DIAGNOSIS — M1610 Unilateral primary osteoarthritis, unspecified hip: Secondary | ICD-10-CM | POA: Insufficient documentation

## 2013-10-28 DIAGNOSIS — W010XXA Fall on same level from slipping, tripping and stumbling without subsequent striking against object, initial encounter: Secondary | ICD-10-CM | POA: Insufficient documentation

## 2013-10-28 DIAGNOSIS — F17200 Nicotine dependence, unspecified, uncomplicated: Secondary | ICD-10-CM | POA: Insufficient documentation

## 2013-10-28 DIAGNOSIS — M169 Osteoarthritis of hip, unspecified: Secondary | ICD-10-CM | POA: Insufficient documentation

## 2013-10-28 DIAGNOSIS — I10 Essential (primary) hypertension: Secondary | ICD-10-CM | POA: Insufficient documentation

## 2013-10-28 DIAGNOSIS — S0100XA Unspecified open wound of scalp, initial encounter: Secondary | ICD-10-CM | POA: Insufficient documentation

## 2013-10-28 DIAGNOSIS — S0990XA Unspecified injury of head, initial encounter: Secondary | ICD-10-CM

## 2013-10-28 DIAGNOSIS — S0101XA Laceration without foreign body of scalp, initial encounter: Secondary | ICD-10-CM

## 2013-10-28 DIAGNOSIS — Z79899 Other long term (current) drug therapy: Secondary | ICD-10-CM | POA: Insufficient documentation

## 2013-10-28 DIAGNOSIS — Y92009 Unspecified place in unspecified non-institutional (private) residence as the place of occurrence of the external cause: Secondary | ICD-10-CM | POA: Insufficient documentation

## 2013-10-28 DIAGNOSIS — F329 Major depressive disorder, single episode, unspecified: Secondary | ICD-10-CM | POA: Insufficient documentation

## 2013-10-28 DIAGNOSIS — Y9389 Activity, other specified: Secondary | ICD-10-CM | POA: Insufficient documentation

## 2013-10-28 MED ORDER — HYDROCODONE-ACETAMINOPHEN 5-325 MG PO TABS: 1.0000 | ORAL_TABLET | ORAL | Status: DC | PRN

## 2013-10-28 MED ORDER — HYDROCODONE-ACETAMINOPHEN 5-325 MG PO TABS
1.0000 | ORAL_TABLET | Freq: Once | ORAL | Status: AC
  Administered 2013-10-28: 1 via ORAL
  Filled 2013-10-28: qty 1

## 2013-10-28 NOTE — ED Notes (Addendum)
Pt states that she got up last night to use the bathroom and got tripped up in her black satin sheets.  States that she fell backwards and hit the back of her head on the corner of the nightstand.  Pt states that she has been very dizzy since.  Denies LOC.  Feels nauseated but no vomiting.  Took two aspirin for pain.  Appx 3 cm laceration noted to lt occipital region.

## 2013-10-28 NOTE — ED Provider Notes (Signed)
CSN: [id number]     Arrival date & time 10/28/13  1052 History   First MD Initiated Contact with Patient 10/28/13 1139     Chief Complaint  Patient presents with  . Fall  . Head Injury     (Consider location/radiation/quality/duration/timing/severity/associated sxs/prior Treatment) HPI  Patient reports she got up last night from bed to go to the bathroom and accidentally tripped on her sheets, falling back and hitting her head against the nightstand.  States she was lightheaded after this happened but denies syncope or confusion.  States she continued to be lightheaded and her significant other noted a laceration on her scalp.  Has mild pain in her left neck. She is not on blood thinners but did take aspirin and ibuprofen for her pain this morning.  Pain is 7/10 intensity around the back of her head.  Is nauseated.  Denies focal neurologic deficits, pain anywhere else.  Denies any abnormal bleeding.    Past Medical History  Diagnosis Date  . Hypertension   . Depression   . Suicide attempt     x 3  . Osteoarthritis     right hip  . Fx clavicle     Hx of left clavicle fx  . Hx: UTI (urinary tract infection)    Past Surgical History  Procedure Laterality Date  . Orif left clavicle fracture and pinning 2nd metacarpal  01/03/2010   Family History  Problem Relation Age of Onset  . ADD / ADHD Neg Hx   . Anxiety disorder Neg Hx   . Alcohol abuse Neg Hx   . Bipolar disorder Neg Hx   . Dementia Neg Hx   . Depression Neg Hx   . Drug abuse Neg Hx   . OCD Neg Hx   . Schizophrenia Neg Hx    History  Substance Use Topics  . Smoking status: Current Every Day Smoker -- 1.00 packs/day for 40 years    Types: Cigarettes  . Smokeless tobacco: Never Used  . Alcohol Use: 0.6 oz/week    0 Shots of liquor, 1 Cans of beer per week     Comment: was drinking beer daily now down to total 1.5 cans a week, quit hard liquor 1 month ago   OB History   Grav Para Term Preterm Abortions TAB SAB Ect  Mult Living                 Review of Systems  Constitutional: Negative for fever.  Respiratory: Negative for cough and shortness of breath.   Cardiovascular: Negative for chest pain.  Gastrointestinal: Negative for abdominal pain.  Musculoskeletal: Positive for neck pain. Negative for back pain.  Skin: Positive for wound.  Allergic/Immunologic: Negative for immunocompromised state.  Neurological: Negative for weakness and numbness.  Hematological: Does not bruise/bleed easily.  All other systems reviewed and are negative.     Allergies  Review of patient's allergies indicates no known allergies.  Home Medications   Prior to Admission medications   Medication Sig Start Date End Date Taking? Authorizing Provider  ALPRAZolam Prudy Feeler(XANAX) 1 MG tablet Take 1 mg by mouth once.   Yes Historical Provider, MD  Aspirin-Acetaminophen-Caffeine 260-130-16 MG TABS Take 1 packet by mouth 3 (three) times daily as needed (headache.).   Yes Historical Provider, MD  citalopram (CELEXA) 20 MG tablet Take 20 mg by mouth at bedtime.   Yes Historical Provider, MD  Hyprom-Naphaz-Polysorb-Zn Sulf (CLEAR EYES COMPLETE OP) Apply 1 drop to eye as needed (For dry eyes).  Yes Historical Provider, MD  ibuprofen (ADVIL,MOTRIN) 200 MG tablet Take 400 mg by mouth every 6 (six) hours as needed for moderate pain.   Yes Historical Provider, MD  lisinopril (PRINIVIL,ZESTRIL) 40 MG tablet Take 1 tablet (40 mg total) by mouth every morning. For high blood pressure control 05/29/13  Yes Sanjuana Kava, NP  QUEtiapine (SEROQUEL) 50 MG tablet Take 50 mg by mouth at bedtime.   Yes Historical Provider, MD   BP 189/100  Pulse 110  Temp(Src) 98.6 F (37 C) (Oral)  Resp 18  SpO2 100% Physical Exam  Nursing note and vitals reviewed. Constitutional: She appears well-developed and well-nourished. No distress.  HENT:  Head: Normocephalic.    Neck: Neck supple.  Pulmonary/Chest: Effort normal.  Neurological: She is alert.  She exhibits normal muscle tone. GCS eye subscore is 4. GCS verbal subscore is 5. GCS motor subscore is 6.  CN II-XII intact, EOMs intact, no pronator drift, grip strengths equal bilaterally; strength 5/5 in all extremities, sensation intact in all extremities; finger to nose, heel to shin, rapid alternating movements normal; gait is normal.     Skin: She is not diaphoretic.  Psychiatric: She has a normal mood and affect. Her behavior is normal.    ED Course  Procedures (including critical care time) Labs Review Labs Reviewed - No data to display  Imaging Review Ct Head Wo Contrast  10/28/2013   CLINICAL DATA:  Status post fall.  Head injury.  EXAM: CT HEAD WITHOUT CONTRAST  CT CERVICAL SPINE WITHOUT CONTRAST  TECHNIQUE: Multidetector CT imaging of the head and cervical spine was performed following the standard protocol without intravenous contrast. Multiplanar CT image reconstructions of the cervical spine were also generated.  COMPARISON:  Head CT scan 09/20/2013. Head and cervical spine CT scan 04/01/2011.  FINDINGS: CT HEAD FINDINGS  There is no evidence of acute intracranial abnormality including hemorrhage, infarct, midline shift or abnormal extra-axial fluid collection. 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 Atrophy and chronic microvascular ischemic change are also again identified. There is no hydrocephalus or pneumocephalus. The calvarium is intact. Imaged paranasal sinuses and mastoid air cells are clear. Carotid atherosclerosis is noted.  CT CERVICAL SPINE FINDINGS  No fracture or malalignment of the cervical spine is identified. There is degenerative disc disease at C5-6 and C6-7 which does not appear markedly changed. Lung apices are clear.  IMPRESSION: No acute finding head or cervical spine.  Atrophy and chronic microvascular ischemic change.  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  Degenerative disc disease C5-6 and C6-7.   Electronically Signed   By: Drusilla Kanner M.D.    On: 10/28/2013 12:44   Ct Cervical Spine Wo Contrast  10/28/2013   CLINICAL DATA:  Status post fall.  Head injury.  EXAM: CT HEAD WITHOUT CONTRAST  CT CERVICAL SPINE WITHOUT CONTRAST  TECHNIQUE: Multidetector CT imaging of the head and cervical spine was performed following the standard protocol without intravenous contrast. Multiplanar CT image reconstructions of the cervical spine were also generated.  COMPARISON:  Head CT scan 09/20/2013. Head and cervical spine CT scan 04/01/2011.  FINDINGS: CT HEAD FINDINGS  There is no evidence of acute intracranial abnormality including hemorrhage, infarct, midline shift or abnormal extra-axial fluid collection. 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 Atrophy and chronic microvascular ischemic change are also again identified. There is no hydrocephalus or pneumocephalus. The calvarium is intact. Imaged paranasal sinuses and mastoid air cells are clear. Carotid atherosclerosis is  noted.  CT CERVICAL SPINE FINDINGS  No fracture or malalignment of the cervical spine is identified. There is degenerative disc disease at C5-6 and C6-7 which does not appear markedly changed. Lung apices are clear.  IMPRESSION: No acute finding head or cervical spine.  Atrophy and chronic microvascular ischemic change.  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  Degenerative disc disease C5-6 and C6-7.   Electronically Signed   By: Drusilla Kannerhomas  Dalessio M.D.   On: 10/28/2013 12:44     EKG Interpretation None     Filed Vitals:   10/28/13 1320  BP: 173/97  Pulse: 72  Temp: 98.7 F (37.1 C)  Resp: 18     LACERATION REPAIR Performed by: Trixie DredgeEmily Jolynda Townley Authorized by: Trixie DredgeEmily Tinleigh Whitmire Consent: Verbal consent obtained. Risks and benefits: risks, benefits and alternatives were discussed Consent given by: patient Patient identity confirmed: provided demographic data Prepped and Draped in normal sterile fashion Wound explored  Laceration Location: left occiput  Laceration Length:  3cm  No Foreign Bodies seen or palpated  Anesthesia: local infiltration  Local anesthetic: lidocaine 2% no epinephrine  Anesthetic total: 5 ml  Irrigation method: syringe Amount of cleaning: standard  Skin closure: staples  Number of sutures: 4  Technique: staples  Patient tolerance: Patient tolerated the procedure well with no immediate complications.  Pt informed she will need to have these out in 10 days.     MDM   Final diagnoses:  Head injury  Occipital scalp laceration    Pt with mechanical fall last night 3am sustaining occipital scalp laceration on nightstand.  No LOC. Neurologically intact.  Head CT and cervical spine CT negative for acute change or injury.  Not on blood thinners chronically.  Pt denies SI currently, feels comfortable being d/c home with pain medication.  Does not feel that she is a danger to herself.  Wound repaired in ED.  Discussed result, findings, treatment, and follow up  with patient.  Pt given return precautions.  Pt verbalizes understanding and agrees with plan.        Trixie Dredgemily Elizar Alpern, PA-C 10/28/13 1323

## 2013-10-28 NOTE — ED Provider Notes (Signed)
Medical screening examination/treatment/procedure(s) were performed by non-physician practitioner and as supervising physician I was immediately available for consultation/collaboration.   EKG Interpretation None      Devoria AlbeIva Allyce Bochicchio, MD, Armando GangFACEP   Ward GivensIva L Eliette Drumwright, MD 10/28/13 1500

## 2013-10-28 NOTE — Telephone Encounter (Signed)
Patient needs to return to [facility] for reevaluation.

## 2013-10-28 NOTE — Telephone Encounter (Signed)
[facility]. Phone just rings. Will try again after 5pm

## 2013-10-28 NOTE — Discharge Instructions (Signed)
Read the information below.  Use the prescribed medication as directed.  Please discuss all new medications with your pharmacist.  Do not take additional tylenol while taking the prescribed pain medication to avoid overdose.  You may return to the Emergency Department at any time for worsening condition or any new symptoms that concern you.  If you develop uncontrolled pain, weakness or numbness of the extremities, change in your vision, difficulty speaking or walking, return to the ER for a recheck.  If you develop redness, swelling, pus draining from the wound, or fevers greater than 100.4, return to the ER immediately for a recheck.     Head Injury, Adult You have received a head injury. It does not appear serious at this time. Headaches and vomiting are common following head injury. It should be easy to awaken from sleeping. Sometimes it is necessary for you to stay in the emergency department for a while for observation. Sometimes admission to the hospital may be needed. After injuries such as yours, most problems occur within the first 24 hours, but side effects may occur up to 7 10 days after the injury. It is important for you to carefully monitor your condition and contact your health care provider or seek immediate medical care if there is a change in your condition. WHAT ARE THE TYPES OF HEAD INJURIES? Head injuries can be as minor as a bump. Some head injuries can be more severe. More severe head injuries include:  A jarring injury to the brain (concussion).  A bruise of the brain (contusion). This mean there is bleeding in the brain that can cause swelling.  A cracked skull (skull fracture).  Bleeding in the brain that collects, clots, and forms a bump (hematoma). WHAT CAUSES A HEAD INJURY? A serious head injury is most likely to happen to someone who is in a car wreck and is not wearing a seat belt. Other causes of major head injuries include bicycle or motorcycle accidents, sports  injuries, and falls. HOW ARE HEAD INJURIES DIAGNOSED? A complete history of the event leading to the injury and your current symptoms will be helpful in diagnosing head injuries. Many times, pictures of the brain, such as CT or MRI are needed to see the extent of the injury. Often, an overnight hospital stay is necessary for observation.  WHEN SHOULD I SEEK IMMEDIATE MEDICAL CARE?  You should get help right away if:  You have confusion or drowsiness.  You feel sick to your stomach (nauseous) or have continued, forceful vomiting.  You have dizziness or unsteadiness that is getting worse.  You have severe, continued headaches not relieved by medicine. Only take over-the-counter or prescription medicines for pain, fever, or discomfort as directed by your health care provider.  You do not have normal function of the arms or legs or are unable to walk.  You notice changes in the black spots in the center of the colored part of your eye (pupil).  You have a clear or bloody fluid coming from your nose or ears.  You have a loss of vision. During the next 24 hours after the injury, you must stay with someone who can watch you for the warning signs. This person should contact local emergency services (911 in the [place] if you have seizures, you become unconscious, or you are unable to wake up. HOW CAN I PREVENT A HEAD INJURY IN THE FUTURE? The most important factor for preventing major head injuries is avoiding motor vehicle accidents. To minimize  the potential for damage to your head, it is crucial to wear seat belts while riding in motor vehicles. Wearing helmets while bike riding and playing collision sports (like football) is also helpful. Also, avoiding dangerous activities around the house will further help reduce your risk of head injury.  WHEN CAN I RETURN TO NORMAL ACTIVITIES AND ATHLETICS? You should be reevaluated by your health care provider before returning to these activities. If you  have any of the following symptoms, you should not return to activities or contact sports until 1 week after the symptoms have stopped:  Persistent headache.  Dizziness or vertigo.  Poor attention and concentration.  Confusion.  Memory problems.  Nausea or vomiting.  Fatigue or tire easily.  Irritability.  Intolerant of bright lights or loud noises.  Anxiety or depression.  Disturbed sleep. MAKE SURE YOU:   Understand these instructions.  Will watch your condition.  Will get help right away if you are not doing well or get worse. Document Released: 06/11/2005 Document Revised: 04/01/2013 Document Reviewed: 02/16/2013 [facility] Patient Information 2014 [place], [place].  Laceration Care, Adult A laceration is a cut or lesion that goes through all layers of the skin and into the tissue just beneath the skin. TREATMENT  Some lacerations may not require closure. Some lacerations may not be able to be closed due to an increased risk of infection. It is important to see your caregiver as soon as possible after an injury to minimize the risk of infection and maximize the opportunity for successful closure. If closure is appropriate, pain medicines may be given, if needed. The wound will be cleaned to help prevent infection. Your caregiver will use stitches (sutures), staples, wound glue (adhesive), or skin adhesive strips to repair the laceration. These tools bring the skin edges together to allow for faster healing and a better cosmetic outcome. However, all wounds will heal with a scar. Once the wound has healed, scarring can be minimized by covering the wound with sunscreen during the day for 1 full year. HOME CARE INSTRUCTIONS  For sutures or staples:  Keep the wound clean and dry.  If you were given a bandage (dressing), you should change it at least once a day. Also, change the dressing if it becomes wet or dirty, or as directed by your caregiver.  Wash the wound with soap  and water 2 times a day. Rinse the wound off with water to remove all soap. Pat the wound dry with a clean towel.  After cleaning, apply a thin layer of the antibiotic ointment as recommended by your caregiver. This will help prevent infection and keep the dressing from sticking.  You may shower as usual after the first 24 hours. Do not soak the wound in water until the sutures are removed.  Only take over-the-counter or prescription medicines for pain, discomfort, or fever as directed by your caregiver.  Get your sutures or staples removed as directed by your caregiver. For skin adhesive strips:  Keep the wound clean and dry.  Do not get the skin adhesive strips wet. You may bathe carefully, using caution to keep the wound dry.  If the wound gets wet, pat it dry with a clean towel.  Skin adhesive strips will fall off on their own. You may trim the strips as the wound heals. Do not remove skin adhesive strips that are still stuck to the wound. They will fall off in time. For wound adhesive:  You may briefly wet your wound in the  shower or bath. Do not soak or scrub the wound. Do not swim. Avoid periods of heavy perspiration until the skin adhesive has fallen off on its own. After showering or bathing, gently pat the wound dry with a clean towel.  Do not apply liquid medicine, cream medicine, or ointment medicine to your wound while the skin adhesive is in place. This may loosen the film before your wound is healed.  If a dressing is placed over the wound, be careful not to apply tape directly over the skin adhesive. This may cause the adhesive to be pulled off before the wound is healed.  Avoid prolonged exposure to sunlight or tanning lamps while the skin adhesive is in place. Exposure to ultraviolet light in the first year will darken the scar.  The skin adhesive will usually remain in place for 5 to 10 days, then naturally fall off the skin. Do not pick at the adhesive film. You may  need a tetanus shot if:  You cannot remember when you had your last tetanus shot.  You have never had a tetanus shot. If you get a tetanus shot, your arm may swell, get red, and feel warm to the touch. This is common and not a problem. If you need a tetanus shot and you choose not to have one, there is a rare chance of getting tetanus. Sickness from tetanus can be serious. SEEK MEDICAL CARE IF:   You have redness, swelling, or increasing pain in the wound.  You see a red line that goes away from the wound.  You have yellowish-white fluid (pus) coming from the wound.  You have a fever.  You notice a bad smell coming from the wound or dressing.  Your wound breaks open before or after sutures have been removed.  You notice something coming out of the wound such as wood or glass.  Your wound is on your hand or foot and you cannot move a finger or toe. SEEK IMMEDIATE MEDICAL CARE IF:   Your pain is not controlled with prescribed medicine.  You have severe swelling around the wound causing pain and numbness or a change in color in your arm, hand, leg, or foot.  Your wound splits open and starts bleeding.  You have worsening numbness, weakness, or loss of function of any joint around or beyond the wound.  You develop painful lumps near the wound or on the skin anywhere on your body. MAKE SURE YOU:   Understand these instructions.  Will watch your condition.  Will get help right away if you are not doing well or get worse. Document Released: 06/11/2005 Document Revised: 09/03/2011 Document Reviewed: 12/05/2010 [facility] Patient Information 2014 [place], [place].

## 2013-10-29 NOTE — Telephone Encounter (Signed)
Tried to call Byrd HesselbachMaria.  No answer.

## 2013-10-30 NOTE — Telephone Encounter (Signed)
LMVM that Dr. Cain SaupeL's recommendation is still to [facility] the [facility].  If anything further is needed from us, please call back.

## 2013-11-07 ENCOUNTER — Ambulatory Visit: Payer: Managed Care, Other (non HMO) | Admitting: Internal Medicine

## 2013-11-07 ENCOUNTER — Telehealth: Payer: Self-pay

## 2013-11-07 VITALS — BP 160/80 | HR 97 | Temp 98.5°F | Resp 16 | Ht 62.0 in | Wt 141.0 lb

## 2013-11-07 DIAGNOSIS — R51 Headache: Secondary | ICD-10-CM

## 2013-11-07 DIAGNOSIS — Z7189 Other specified counseling: Secondary | ICD-10-CM

## 2013-11-07 DIAGNOSIS — S0190XA Unspecified open wound of unspecified part of head, initial encounter: Secondary | ICD-10-CM

## 2013-11-07 MED ORDER — TRAMADOL HCL 50 MG PO TABS: 50.0000 mg | ORAL_TABLET | Freq: Three times a day (TID) | ORAL | Status: DC | PRN

## 2013-11-07 NOTE — Progress Notes (Signed)
   Subjective:    Patient ID: Brenda Rice, female    DOB: 03/13/1956, 58 y.o.   MRN: [id number]  HPI 57yo female presents to clinic for removal of staples to head wound.  Original injury 10/27/2013 - patient got up in the middle of the night to go to the bathroom, tripped and hit the back of her head on the nightstand.  Pt did not realize the laceration was there until the next morning.  [facility] and was treated there with laceration closure with staples x 4 and CT head.    Complains of headache today.   Pt requests Tramadol for pain as [facility] was too strong for patient.   Dr. Patsy Lageropland her doctor.  Wound is healed, she is back to her baseline. No focal neuro sxs.    Review of Systems See complex problem list    Objective:   Physical Exam  Constitutional: She is oriented to person, place, and time. She appears well-developed and well-nourished.  HENT:  Head: Normocephalic.  Eyes: EOM are normal.  Neck: Normal range of motion. Neck supple.  Pulmonary/Chest: Effort normal.  Musculoskeletal: She exhibits tenderness.  Neurological: She is alert and oriented to person, place, and time. No cranial nerve deficit. She exhibits normal muscle tone. Coordination normal.  Skin: Laceration noted. Rash is not pustular. No erythema.     Psychiatric: She has a normal mood and affect. Her behavior is normal. Judgment and thought content normal.          Assessment & Plan:  Removed 4 stables/Wound healing nicely Chronic pain/Tramadol 50mg  prn/Head care

## 2013-11-07 NOTE — Progress Notes (Signed)
   Subjective:    Patient ID: Brenda Rice, female    DOB: 01/25/1956, 58 y.o.   MRN: [id number]  HPI    Review of Systems     Objective:   Physical Exam        Assessment & Plan:

## 2013-11-07 NOTE — Telephone Encounter (Signed)
Patient was seen by Dr. Perrin MalteseGuest this morning.   She is requesting refills on xanax and linsinopril [facility] [address]   [phone]

## 2013-11-07 NOTE — Patient Instructions (Signed)
Smoking Cessation Quitting smoking is important to your health and has many advantages. However, it is not always easy to quit since nicotine is a very addictive drug. Often times, people try 3 times or more before being able to quit. This document explains the best ways for you to prepare to quit smoking. Quitting takes hard work and a lot of effort, but you can do it. ADVANTAGES OF QUITTING SMOKING  You will live longer, feel better, and live better.  Your body will feel the impact of quitting smoking almost immediately.  Within 20 minutes, blood pressure decreases. Your pulse returns to its normal level.  After 8 hours, carbon monoxide levels in the blood return to normal. Your oxygen level increases.  After 24 hours, the chance of having a heart attack starts to decrease. Your breath, hair, and body stop smelling like smoke.  After 48 hours, damaged nerve endings begin to recover. Your sense of taste and smell improve.  After 72 hours, the body is virtually free of nicotine. Your bronchial tubes relax and breathing becomes easier.  After 2 to 12 weeks, lungs can hold more air. Exercise becomes easier and circulation improves.  The risk of having a heart attack, stroke, cancer, or lung disease is greatly reduced.  After 1 year, the risk of coronary heart disease is cut in half.  After 5 years, the risk of stroke falls to the same as a nonsmoker.  After 10 years, the risk of lung cancer is cut in half and the risk of other cancers decreases significantly.  After 15 years, the risk of coronary heart disease drops, usually to the level of a nonsmoker.  If you are pregnant, quitting smoking will improve your chances of having a healthy baby.  The people you live with, especially any children, will be healthier.  You will have extra money to spend on things other than cigarettes. QUESTIONS TO THINK ABOUT BEFORE ATTEMPTING TO QUIT You may want to talk about your answers with your  caregiver.  Why do you want to quit?  If you tried to quit in the past, what helped and what did not?  What will be the most difficult situations for you after you quit? How will you plan to handle them?  Who can help you through the tough times? Your family? Friends? A caregiver?  What pleasures do you get from smoking? What ways can you still get pleasure if you quit? Here are some questions to ask your caregiver:  How can you help me to be successful at quitting?  What medicine do you think would be best for me and how should I take it?  What should I do if I need more help?  What is smoking withdrawal like? How can I get information on withdrawal? GET READY  Set a quit date.  Change your environment by getting rid of all cigarettes, ashtrays, matches, and lighters in your home, car, or work. Do not let people smoke in your home.  Review your past attempts to quit. Think about what worked and what did not. GET SUPPORT AND ENCOURAGEMENT You have a better chance of being successful if you have help. You can get support in many ways.  Tell your family, friends, and co-workers that you are going to quit and need their support. Ask them not to smoke around you.  Get individual, group, or telephone counseling and support. Programs are available at local hospitals and health centers. Call your local health department for   information about programs in your area.  Spiritual beliefs and practices may help some smokers quit.  Download a "quit meter" on your computer to keep track of quit statistics, such as how long you have gone without smoking, cigarettes not smoked, and money saved.  Get a self-help book about quitting smoking and staying off of tobacco. LEARN NEW SKILLS AND BEHAVIORS  Distract yourself from urges to smoke. Talk to someone, go for a walk, or occupy your time with a task.  Change your normal routine. Take a different route to work. Drink tea instead of coffee.  Eat breakfast in a different place.  Reduce your stress. Take a hot bath, exercise, or read a book.  Plan something enjoyable to do every day. Reward yourself for not smoking.  Explore interactive web-based programs that specialize in helping you quit. GET MEDICINE AND USE IT CORRECTLY Medicines can help you stop smoking and decrease the urge to smoke. Combining medicine with the above behavioral methods and support can greatly increase your chances of successfully quitting smoking.  Nicotine replacement therapy helps deliver nicotine to your body without the negative effects and risks of smoking. Nicotine replacement therapy includes nicotine gum, lozenges, inhalers, nasal sprays, and skin patches. Some may be available over-the-counter and others require a prescription.  Antidepressant medicine helps people abstain from smoking, but how this works is unknown. This medicine is available by prescription.  Nicotinic receptor partial agonist medicine simulates the effect of nicotine in your brain. This medicine is available by prescription. Ask your caregiver for advice about which medicines to use and how to use them based on your health history. Your caregiver will tell you what side effects to look out for if you choose to be on a medicine or therapy. Carefully read the information on the package. Do not use any other product containing nicotine while using a nicotine replacement product.  RELAPSE OR DIFFICULT SITUATIONS Most relapses occur within the first 3 months after quitting. Do not be discouraged if you start smoking again. Remember, most people try several times before finally quitting. You may have symptoms of withdrawal because your body is used to nicotine. You may crave cigarettes, be irritable, feel very hungry, cough often, get headaches, or have difficulty concentrating. The withdrawal symptoms are only temporary. They are strongest when you first quit, but they will go away within  10 14 days. To reduce the chances of relapse, try to:  Avoid drinking alcohol. Drinking lowers your chances of successfully quitting.  Reduce the amount of caffeine you consume. Once you quit smoking, the amount of caffeine in your body increases and can give you symptoms, such as a rapid heartbeat, sweating, and anxiety.  Avoid smokers because they can make you want to smoke.  Do not let weight gain distract you. Many smokers will gain weight when they quit, usually less than 10 pounds. Eat a healthy diet and stay active. You can always lose the weight gained after you quit.  Find ways to improve your mood other than smoking. FOR MORE INFORMATION  www.smokefree.gov  Document Released: 06/05/2001 Document Revised: 12/11/2011 Document Reviewed: 09/20/2011 [employer] Patient Information 2014 [employer].  

## 2013-11-07 NOTE — Telephone Encounter (Signed)
Dr. Elbert EwingsL, Dr. Perrin MalteseGuest wanted me to ask you to RF this pt's meds since you were the last one to prescribe them. Thanks

## 2013-11-08 MED ORDER — LISINOPRIL 40 MG PO TABS: 40.0000 mg | ORAL_TABLET | Freq: Every day | ORAL | Status: DC

## 2013-11-08 MED ORDER — ALPRAZOLAM 1 MG PO TABS: 1.0000 mg | ORAL_TABLET | Freq: Two times a day (BID) | ORAL | Status: DC

## 2013-11-08 NOTE — Telephone Encounter (Signed)
Ok to refill theses two for 6 months

## 2013-11-08 NOTE — Telephone Encounter (Signed)
Rx's sent to pharm and pt notified.

## 2013-11-19 ENCOUNTER — Ambulatory Visit (HOSPITAL_COMMUNITY): Payer: Self-pay | Admitting: Psychiatry

## 2013-12-01 ENCOUNTER — Other Ambulatory Visit (HOSPITAL_COMMUNITY): Payer: Self-pay | Admitting: Psychiatry

## 2013-12-03 ENCOUNTER — Ambulatory Visit (HOSPITAL_COMMUNITY): Payer: Self-pay | Admitting: Psychiatry

## 2013-12-04 ENCOUNTER — Telehealth (HOSPITAL_COMMUNITY): Payer: Self-pay

## 2013-12-04 DIAGNOSIS — F329 Major depressive disorder, single episode, unspecified: Secondary | ICD-10-CM

## 2013-12-08 MED ORDER — QUETIAPINE FUMARATE 50 MG PO TABS: 50.0000 mg | ORAL_TABLET | Freq: Every day | ORAL | Status: DC

## 2013-12-08 NOTE — Telephone Encounter (Signed)
Refilled Seroquel 50mg  po qHS #30 with no refills on 12/08/2013.

## 2013-12-29 ENCOUNTER — Other Ambulatory Visit (HOSPITAL_COMMUNITY): Payer: Self-pay | Admitting: Psychiatry

## 2014-01-10 ENCOUNTER — Ambulatory Visit (INDEPENDENT_AMBULATORY_CARE_PROVIDER_SITE_OTHER): Payer: Managed Care, Other (non HMO) | Admitting: Emergency Medicine

## 2014-01-10 VITALS — BP 132/78 | HR 88 | Temp 98.6°F | Resp 18 | Ht 62.5 in | Wt 149.0 lb

## 2014-01-10 DIAGNOSIS — F329 Major depressive disorder, single episode, unspecified: Secondary | ICD-10-CM

## 2014-01-10 DIAGNOSIS — F32A Depression, unspecified: Secondary | ICD-10-CM

## 2014-01-10 DIAGNOSIS — F3289 Other specified depressive episodes: Secondary | ICD-10-CM

## 2014-01-10 MED ORDER — CITALOPRAM HYDROBROMIDE 20 MG PO TABS: 20.0000 mg | ORAL_TABLET | Freq: Every day | ORAL | Status: DC

## 2014-01-10 MED ORDER — QUETIAPINE FUMARATE 50 MG PO TABS: 50.0000 mg | ORAL_TABLET | Freq: Every day | ORAL | Status: DC

## 2014-01-10 NOTE — Progress Notes (Signed)
[facility] [address], [place] [id number] [phone]  Date:  01/10/2014   Name:  Brenda Rice   DOB:  10/28/1955   MRN:  [id number]  PCP:  Abbe AmsterdamOPLAND,JESSICA, MD    Chief Complaint: Medication Refill   History of Present Illness:  Brenda Rice is a 58 y.o. very pleasant female patient who presents with the following:  Under threrapy for depression.  Sees a PHD therapist in favor of her psychiatrist.  Denies suicidal thoughts or thought of harm to others.  Smokes 1 ppd.  "trying to quit"   No improvement with over the counter medications or other home remedies. Denies other complaint or health concern today.   Patient Active Problem List   Diagnosis Date Noted  . MDD (major depressive disorder) 10/06/2013  . Mild benzodiazepine use disorder 10/06/2013  . Acute respiratory failure 09/20/2013  . Alcohol dependence 05/26/2013  . GAD (generalized anxiety disorder) 05/26/2013  . Depressive disorder 05/26/2013  . Benzodiazepine dependence 05/25/2013  . Alcohol dependence syndrome 02/15/2013  . Suicide attempt by multiple drug overdose 02/15/2013  . Substance induced mood disorder 02/15/2013  . Nicotine dependence 02/15/2013  . COPD (chronic obstructive pulmonary disease) 02/15/2013  . Hyponatremia 07/05/2012  . Atypical chest pain 07/05/2012  . Nicotine addiction 09/02/2011  . Hypertension 07/12/2011  . Benzodiazepine overdose 07/11/2011  . 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 Respiratory failure 07/09/2011  . Overdose of benzodiazepine 07/09/2011    Past Medical History  Diagnosis Date  . Hypertension   . Depression   . Suicide attempt     x 3  . Osteoarthritis     right hip  . Fx clavicle     Hx of left clavicle fx  . Hx: UTI (urinary tract infection)     Past Surgical History  Procedure Laterality Date  . Orif left clavicle fracture and pinning 2nd metacarpal  01/03/2010    History   Substance Use Topics  . Smoking status: Current Every Day Smoker -- 1.00 packs/day for 40 years    Types: Cigarettes  . Smokeless tobacco: Never Used  . Alcohol Use: 0.6 oz/week    0 Shots of liquor, 1 Cans of beer per week     Comment: was drinking beer daily now down to total 1.5 cans a week, quit hard liquor 1 month ago    Family History  Problem Relation Age of Onset  . ADD / ADHD Neg Hx   . Anxiety disorder Neg Hx   . Alcohol abuse Neg Hx   . Bipolar disorder Neg Hx   . Dementia Neg Hx   . Depression Neg Hx   . Drug abuse Neg Hx   . OCD Neg Hx   . Schizophrenia Neg Hx     No Known Allergies  Medication list has been reviewed and updated.  Current Outpatient Prescriptions on File Prior to Visit  Medication Sig Dispense Refill  . ALPRAZolam (XANAX) 1 MG tablet Take 1 tablet (1 mg total) by mouth 2 (two) times daily.  60 tablet  5  . citalopram (CELEXA) 20 MG tablet Take 20 mg by mouth at bedtime.      Marland Kitchen. ibuprofen (ADVIL,MOTRIN) 200 MG tablet Take 400 mg by mouth every 6 (six) hours as needed for moderate pain.      Marland Kitchen. lisinopril (PRINIVIL,ZESTRIL) 40 MG tablet Take 1 tablet (40 mg total) by mouth daily. For high blood pressure control  90 tablet  1  . QUEtiapine (SEROQUEL) 50 MG tablet Take 1 tablet (50 mg total) by mouth at bedtime.  30 tablet  0  . Hyprom-Naphaz-Polysorb-Zn Sulf (CLEAR EYES COMPLETE OP) Apply 1 drop to eye as needed (For dry eyes).       No current facility-administered medications on file prior to visit.    Review of Systems:  As per HPI, otherwise negative.    Physical Examination: Filed Vitals:   01/10/14 1026  BP: 132/78  Pulse: 88  Temp: 98.6 F (37 C)  Resp: 18   Filed Vitals:   01/10/14 1026  Height: 5' 2.5" (1.588 m)  Weight: 149 lb (67.586 kg)   Body mass index is 26.8 kg/(m^2). Ideal Body Weight: Weight in (lb) to have BMI = 25: 138.6   GEN: WDWN, NAD, Non-toxic, Alert & Oriented x 3 HEENT: Atraumatic, Normocephalic.   Ears and Nose: No external deformity. EXTR: No clubbing/cyanosis/edema NEURO: Normal gait.  PSYCH: Normally interactive. Conversant. Not depressed or anxious appearing.  Calm demeanor.  Strong odor of stale cigarette smoke   Assessment and Plan: Depression Refill seroquel and celexa   Signed,  Phillips Odor, MD

## 2014-01-10 NOTE — Patient Instructions (Signed)
Depression, Adult Depression refers to feeling sad, low, down in the dumps, blue, gloomy, or empty. In general, there are two kinds of depression: 1. Depression that we all experience from time to time because of upsetting life experiences, including the loss of a job or the ending of a relationship (normal sadness or normal grief). This kind of depression is considered normal, is short lived, and resolves within a few days to 2 weeks. (Depression experienced after the loss of a loved one is called bereavement. Bereavement often lasts longer than 2 weeks but normally gets better with time.) 2. Clinical depression, which lasts longer than normal sadness or normal grief or interferes with your ability to function at home, at work, and in school. It also interferes with your personal relationships. It affects almost every aspect of your life. Clinical depression is an illness. Symptoms of depression also can be caused by conditions other than normal sadness and grief or clinical depression. Examples of these conditions are listed as follows:  Physical illness--Some physical illnesses, including underactive thyroid gland (hypothyroidism), severe anemia, specific types of cancer, diabetes, uncontrolled seizures, heart and lung problems, strokes, and chronic pain are commonly associated with symptoms of depression.  Side effects of some prescription medicine--In some people, certain types of prescription medicine can cause symptoms of depression.  Substance abuse--Abuse of alcohol and illicit drugs can cause symptoms of depression. SYMPTOMS Symptoms of normal sadness and normal grief include the following:  Feeling sad or crying for short periods of time.  Not caring about anything (apathy).  Difficulty sleeping or sleeping too much.  No longer able to enjoy the things you used to enjoy.  Desire to be by oneself all the time (social isolation).  Lack of energy or motivation.  Difficulty  concentrating or remembering.  Change in appetite or weight.  Restlessness or agitation. Symptoms of clinical depression include the same symptoms of normal sadness or normal grief and also the following symptoms:  Feeling sad or crying all the time.  Feelings of guilt or worthlessness.  Feelings of hopelessness or helplessness.  Thoughts of suicide or the desire to harm yourself (suicidal ideation).  Loss of touch with reality (psychotic symptoms). Seeing or hearing things that are not real (hallucinations) or having false beliefs about your life or the people around you (delusions and paranoia). DIAGNOSIS  The diagnosis of clinical depression usually is based on the severity and duration of the symptoms. Your caregiver also will ask you questions about your medical history and substance use to find out if physical illness, use of prescription medicine, or substance abuse is causing your depression. Your caregiver also may order blood tests. TREATMENT  Typically, normal sadness and normal grief do not require treatment. However, sometimes antidepressant medicine is prescribed for bereavement to ease the depressive symptoms until they resolve. The treatment for clinical depression depends on the severity of your symptoms but typically includes antidepressant medicine, counseling with a mental health professional, or a combination of both. Your caregiver will help to determine what treatment is best for you. Depression caused by physical illness usually goes away with appropriate medical treatment of the illness. If prescription medicine is causing depression, talk with your caregiver about stopping the medicine, decreasing the dose, or substituting another medicine. Depression caused by abuse of alcohol or illicit drugs abuse goes away with abstinence from these substances. Some adults need professional help in order to stop drinking or using drugs. SEEK IMMEDIATE CARE IF:  You have thoughts  about   hurting yourself or others.  You lose touch with reality (have psychotic symptoms).  You are taking medicine for depression and have a serious side effect. FOR MORE INFORMATION National Alliance on Mental Illness: www.nami.org National Institute of Mental Health: www.nimh.nih.gov Document Released: 06/08/2000 Document Revised: 12/11/2011 Document Reviewed: 09/10/2011 [employer] Patient Information 2015 [employer]. This information is not intended to replace advice given to you by your health care provider. Make sure you discuss any questions you have with your health care provider.  

## 2014-03-10 ENCOUNTER — Ambulatory Visit (INDEPENDENT_AMBULATORY_CARE_PROVIDER_SITE_OTHER): Payer: Managed Care, Other (non HMO) | Admitting: Family Medicine

## 2014-03-10 ENCOUNTER — Ambulatory Visit (INDEPENDENT_AMBULATORY_CARE_PROVIDER_SITE_OTHER): Payer: Managed Care, Other (non HMO)

## 2014-03-10 ENCOUNTER — Other Ambulatory Visit: Payer: Self-pay | Admitting: Family Medicine

## 2014-03-10 VITALS — BP 154/86 | HR 89 | Temp 98.0°F | Resp 17 | Ht 63.0 in | Wt 155.0 lb

## 2014-03-10 DIAGNOSIS — R609 Edema, unspecified: Secondary | ICD-10-CM

## 2014-03-10 DIAGNOSIS — Z1211 Encounter for screening for malignant neoplasm of colon: Secondary | ICD-10-CM

## 2014-03-10 DIAGNOSIS — R635 Abnormal weight gain: Secondary | ICD-10-CM

## 2014-03-10 DIAGNOSIS — R600 Localized edema: Secondary | ICD-10-CM

## 2014-03-10 DIAGNOSIS — R140 Abdominal distension (gaseous): Secondary | ICD-10-CM

## 2014-03-10 LAB — POCT URINALYSIS DIPSTICK
Bilirubin, UA: NEGATIVE
Blood, UA: NEGATIVE
Glucose, UA: NEGATIVE
Ketones, UA: NEGATIVE
Nitrite, UA: NEGATIVE
PH UA: 5.5
PROTEIN UA: NEGATIVE
Spec Grav, UA: <=1.005
Urobilinogen, UA: 0.2

## 2014-03-10 LAB — POCT CBC
Granulocyte percent: 61.2 %G (ref 37–80)
HEMATOCRIT: 40.2 % (ref 37.7–47.9)
Hemoglobin: 13.4 g/dL (ref 12.2–16.2)
LYMPH, POC: 3.2 (ref 0.6–3.4)
MCH, POC: 33 pg — AB (ref 27–31.2)
MCHC: 33.2 g/dL (ref 31.8–35.4)
MCV: 99.3 fL — AB (ref 80–97)
MID (cbc): 0.9 (ref 0–0.9)
MPV: 7.7 fL (ref 0–99.8)
POC Granulocyte: 6.5 (ref 2–6.9)
POC LYMPH PERCENT: 30.3 %L (ref 10–50)
POC MID %: 8.5 %M (ref 0–12)
Platelet Count, POC: 169 10*3/uL (ref 142–424)
RBC: 4.05 M/uL (ref 4.04–5.48)
RDW, POC: 14 %
WBC: 10.7 10*3/uL — AB (ref 4.6–10.2)

## 2014-03-10 LAB — POCT UA - MICROSCOPIC ONLY
Bacteria, U Microscopic: NEGATIVE
CRYSTALS, UR, HPF, POC: NEGATIVE
Mucus, UA: NEGATIVE
Yeast, UA: NEGATIVE

## 2014-03-10 NOTE — Progress Notes (Addendum)
[facility] [address] [place] [address] [phone]  Date:  03/10/2014   Name:  Brenda Rice   DOB:  [birth year]   MRN:  [id number]  PCP:  Abbe Amsterdam, MD    Chief Complaint: Joint Swelling, Weight Gain and Back Pain   History of Present Illness:  Brenda Rice is a 58 y.o. very pleasant female patient who presents with the following:  She is here today with concern about swelling and weight gain.  "I'm falling apart I guess."  Her ankles are swelling- they weil be better in the monring, but get worse as the day goes on.  This is occuring in both legs.  Also, she is retaining fluid in her abdomen, "I look like I'm 9 months pregnant."  She has noted the abdominal swelling for about 10 weeks and the leg swelling for 2 weeks Her breathing is ok- she does not have orthopnea.  No PND.   She does feel full, and uncomfortable with eating. She has noted change in her stool pattern.  Never had a colonoscopy.  She has been a heavy smoke for many years.  She does have early satiety  Wt Readings from Last 3 Encounters:  03/10/14 155 lb (70.308 kg)  01/10/14 149 lb (67.586 kg)  11/07/13 141 lb (63.957 kg)   She also has noted headaches.  She did see her eye doctor but this did not help.    Patient Active Problem List   Diagnosis Date Noted  . MDD (major depressive disorder) 10/06/2013  . Mild benzodiazepine use disorder 10/06/2013  . Acute respiratory failure 09/20/2013  . Alcohol dependence 05/26/2013  . GAD (generalized anxiety disorder) 05/26/2013  . Depressive disorder 05/26/2013  . Benzodiazepine dependence 05/25/2013  . Alcohol dependence syndrome 02/15/2013  . Suicide attempt by multiple drug overdose 02/15/2013  . Substance induced mood disorder 02/15/2013  . Nicotine dependence 02/15/2013  . COPD (chronic obstructive pulmonary disease) 02/15/2013  . Hyponatremia 07/05/2012  . Atypical chest pain 07/05/2012  . Nicotine addiction 09/02/2011  .  Hypertension 07/12/2011  . Benzodiazepine overdose 07/11/2011  . 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 Respiratory failure 07/09/2011  . Overdose of benzodiazepine 07/09/2011    Past Medical History  Diagnosis Date  . Hypertension   . Depression   . Suicide attempt     x 3  . Osteoarthritis     right hip  . Fx clavicle     Hx of left clavicle fx  . Hx: UTI (urinary tract infection)     Past Surgical History  Procedure Laterality Date  . Orif left clavicle fracture and pinning 2nd metacarpal  01/03/2010    History  Substance Use Topics  . Smoking status: Current Every Day Smoker -- 1.50 packs/day for 44 years    Types: Cigarettes  . Smokeless tobacco: Never Used  . Alcohol Use: 0.6 oz/week    0 Shots of liquor, 1 Cans of beer per week     Comment: was drinking beer daily now down to total 1.5 cans a week, quit hard liquor 1 month ago    Family History  Problem Relation Age of Onset  . ADD / ADHD Neg Hx   . Anxiety disorder Neg Hx   . Alcohol abuse Neg Hx   . Bipolar disorder Neg Hx   . Dementia Neg Hx   . Depression Neg Hx   . Drug abuse Neg Hx   .  OCD Neg Hx   . Schizophrenia Neg Hx     No Known Allergies  Medication list has been reviewed and updated.  Current Outpatient Prescriptions on File Prior to Visit  Medication Sig Dispense Refill  . ALPRAZolam (XANAX) 1 MG tablet Take 1 tablet (1 mg total) by mouth 2 (two) times daily.  60 tablet  5  . citalopram (CELEXA) 20 MG tablet Take 1 tablet (20 mg total) by mouth at bedtime.  45 tablet  2  . Hyprom-Naphaz-Polysorb-Zn Sulf (CLEAR EYES COMPLETE OP) Apply 1 drop to eye as needed (For dry eyes).      Marland Kitchen ibuprofen (ADVIL,MOTRIN) 200 MG tablet Take 400 mg by mouth every 6 (six) hours as needed for moderate pain.      Marland Kitchen lisinopril (PRINIVIL,ZESTRIL) 40 MG tablet Take 1 tablet (40 mg total) by mouth daily. For high blood pressure control  90 tablet  1  . QUEtiapine  (SEROQUEL) 50 MG tablet Take 1 tablet (50 mg total) by mouth at bedtime.  30 tablet  2   No current facility-administered medications on file prior to visit.    Review of Systems:  As per HPI- otherwise negative.   Physical Examination: Filed Vitals:   03/10/14 1606  BP: 162/88  Pulse: 104  Temp: 98 F (36.7 C)  Resp: 17   Filed Vitals:   03/10/14 1606  Height:  (1.6 m)  Weight: 155 lb (70.308 kg)   Body mass index is 27.46 kg/(m^2). Ideal Body Weight: Weight in (lb) to have BMI = 25: 140.8  GEN: WDWN, NAD, Non-toxic, A & O x 3 HEENT: Atraumatic, Normocephalic. Neck supple. No masses, No LAD. Ears and Nose: No external deformity. CV: RRR, No M/G/R. No JVD. No thrill. No extra heart sounds. PULM: CTA B, no wheezes, crackles, rhonchi. No retractions. No resp. distress. No accessory muscle use. ABD: S, NT, ND, +BS. No rebound. No HSM.  Has gained weight around her middle.  Liver is enlarged on percussion but non- tender EXTR: No c/c/e NEURO Normal gait.  PSYCH: Normally interactive. Conversant. Not depressed or anxious appearing.  Calm demeanor.   Results for orders placed in visit on 03/10/14  POCT CBC      Result Value Ref Range   WBC 10.7 (*) 4.6 - 10.2 K/uL   Lymph, poc 3.2  0.6 - 3.4   POC LYMPH PERCENT 30.3  10 - 50 %L   MID (cbc) 0.9  0 - 0.9   POC MID % 8.5  0 - 12 %M   POC Granulocyte 6.5  2 - 6.9   Granulocyte percent 61.2  37 - 80 %G   RBC 4.05  4.04 - 5.48 M/uL   Hemoglobin 13.4  12.2 - 16.2 g/dL   HCT, POC 69.6  29.5 - 47.9 %   MCV 99.3 (*) 80 - 97 fL   MCH, POC 33.0 (*) 27 - 31.2 pg   MCHC 33.2  31.8 - 35.4 g/dL   RDW, POC 28.4     Platelet Count, POC 169  142 - 424 K/uL   MPV 7.7  0 - 99.8 fL  POCT UA - MICROSCOPIC ONLY      Result Value Ref Range   WBC, Ur, HPF, POC 0-1     RBC, urine, microscopic 1-5     Bacteria, U Microscopic neg     Mucus, UA neg     Epithelial cells, urine per micros 0-4     Crystals, Ur, HPF,  POC neg     Casts,  Ur, LPF, POC renal tubular     Yeast, UA neg    POCT URINALYSIS DIPSTICK      Result Value Ref Range   Color, UA yellow     Clarity, UA clear     Glucose, UA neg     Bilirubin, UA neg     Ketones, UA neg     Spec Grav, UA <=1.005     Blood, UA neg     pH, UA 5.5     Protein, UA neg     Urobilinogen, UA 0.2     Nitrite, UA neg     Leukocytes, UA Trace      [facility] reading (PRIMARY) by  Dr. Patsy Lager. CXR: negative abd series:  Negative  CHEST - 1 VIEW  COMPARISON: Frontal chest radiograph is obtained and dictated separately today  FINDINGS: Both lungs are clear. The visualized skeletal structures are unremarkable. No pleural effusions.  IMPRESSION: No active disease.  CLINICAL DATA: Bilateral lower extremity edema  EXAM: ACUTE ABDOMEN SERIES (ABDOMEN 2 VIEW & CHEST 1 VIEW)  COMPARISON: 09/23/2013  FINDINGS: There is no evidence of dilated bowel loops or free intraperitoneal air. No radiopaque calculi or other significant radiographic abnormality is seen. Heart size and mediastinal contours are within normal limits. Both lungs are clear. Remote left distal clavicular fracture reidentified with bony callus formation and persistent visualization of the fracture lines. Remote right lateral fifth and sixth rib fractures noted. Leftward curvature of the lumbar spine centered at L3 noted.  IMPRESSION: Negative abdominal radiographs. No acute cardiopulmonary disease.  She would be free to do scans on Saturday.   Assessment and Plan: Bilateral edema of lower extremity - Plan: POCT UA - Microscopic Only, POCT urinalysis dipstick, Brain natriuretic peptide, DG Abd Acute W/Chest, DG Chest 1 View  Abdominal edema on examination - Plan: POCT CBC, Comprehensive metabolic panel, Brain natriuretic peptide  Weight gain - Plan: Brain natriuretic peptide  Halley is here today with some LE edema and concerning abdominal bloating.  Await labs, but will likely end up doing a scan of  her chest, abdomen and pelvis.  She is at higher risk for various cancers due to heavy smoking history.   Signed Abbe Amsterdam, MD  9/17: called to go over labs Results for orders placed in visit on 03/10/14  COMPREHENSIVE METABOLIC PANEL      Result Value Ref Range   Sodium 134 (*) 135 - 145 mEq/L   Potassium 4.0  3.5 - 5.3 mEq/L   Chloride 96  96 - 112 mEq/L   CO2 26  19 - 32 mEq/L   Glucose, Bld 85  70 - 99 mg/dL   BUN 14  6 - 23 mg/dL   Creat 1.61  0.96 - 0.45 mg/dL   Total Bilirubin 0.5  0.2 - 1.2 mg/dL   Alkaline Phosphatase 108  39 - 117 U/L   AST 25  0 - 37 U/L   ALT 14  0 - 35 U/L   Total Protein 7.5  6.0 - 8.3 g/dL   Albumin 4.6  3.5 - 5.2 g/dL   Calcium 9.9  8.4 - 40.9 mg/dL  BRAIN NATRIURETIC PEPTIDE      Result Value Ref Range   Brain Natriuretic Peptide 37.4  0.0 - 100.0 pg/mL  POCT CBC      Result Value Ref Range   WBC 10.7 (*) 4.6 - 10.2 K/uL   Lymph, poc 3.2  0.6 - 3.4   POC LYMPH PERCENT 30.3  10 - 50 %L   MID (cbc) 0.9  0 - 0.9   POC MID % 8.5  0 - 12 %M   POC Granulocyte 6.5  2 - 6.9   Granulocyte percent 61.2  37 - 80 %G   RBC 4.05  4.04 - 5.48 M/uL   Hemoglobin 13.4  12.2 - 16.2 g/dL   HCT, POC 16.1  09.6 - 47.9 %   MCV 99.3 (*) 80 - 97 fL   MCH, POC 33.0 (*) 27 - 31.2 pg   MCHC 33.2  31.8 - 35.4 g/dL   RDW, POC 04.5     Platelet Count, POC 169  142 - 424 K/uL   MPV 7.7  0 - 99.8 fL  POCT UA - MICROSCOPIC ONLY      Result Value Ref Range   WBC, Ur, HPF, POC 0-1     RBC, urine, microscopic 1-5     Bacteria, U Microscopic neg     Mucus, UA neg     Epithelial cells, urine per micros 0-4     Crystals, Ur, HPF, POC neg     Casts, Ur, LPF, POC renal tubular     Yeast, UA neg    POCT URINALYSIS DIPSTICK      Result Value Ref Range   Color, UA yellow     Clarity, UA clear     Glucose, UA neg     Bilirubin, UA neg     Ketones, UA neg     Spec Grav, UA <=1.005     Blood, UA neg     pH, UA 5.5     Protein, UA neg     Urobilinogen, UA 0.2      Nitrite, UA neg     Leukocytes, UA Trace

## 2014-03-11 LAB — COMPREHENSIVE METABOLIC PANEL
ALT: 14 U/L (ref 0–35)
AST: 25 U/L (ref 0–37)
Albumin: 4.6 g/dL (ref 3.5–5.2)
Alkaline Phosphatase: 108 U/L (ref 39–117)
BILIRUBIN TOTAL: 0.5 mg/dL (ref 0.2–1.2)
BUN: 14 mg/dL (ref 6–23)
CO2: 26 meq/L (ref 19–32)
CREATININE: 0.62 mg/dL (ref 0.50–1.10)
Calcium: 9.9 mg/dL (ref 8.4–10.5)
Chloride: 96 mEq/L (ref 96–112)
GLUCOSE: 85 mg/dL (ref 70–99)
Potassium: 4 mEq/L (ref 3.5–5.3)
Sodium: 134 mEq/L — ABNORMAL LOW (ref 135–145)
Total Protein: 7.5 g/dL (ref 6.0–8.3)

## 2014-03-11 LAB — BRAIN NATRIURETIC PEPTIDE: BRAIN NATRIURETIC PEPTIDE: 37.4 pg/mL (ref 0.0–100.0)

## 2014-03-12 LAB — TSH: TSH: 1.341 u[IU]/mL (ref 0.350–4.500)

## 2014-03-18 ENCOUNTER — Telehealth: Payer: Self-pay

## 2014-03-18 NOTE — Telephone Encounter (Signed)
Patient missed call from Dr Patsy Lager and also wants to let Dr Patsy Lager know she has not received a referral for her colonoscopy.

## 2014-03-19 NOTE — Addendum Note (Signed)
Addended by: Abbe Amsterdam C on: 03/19/2014 07:46 PM   Modules accepted: Orders

## 2014-03-19 NOTE — Progress Notes (Signed)
Was able to reach her. Her TSH was normal.  Will refer for [place] of her abd and pelvis, and also to GI for colonoscopy

## 2014-03-22 ENCOUNTER — Other Ambulatory Visit: Payer: Self-pay | Admitting: Radiology

## 2014-03-22 DIAGNOSIS — R102 Pelvic and perineal pain: Secondary | ICD-10-CM

## 2014-03-31 ENCOUNTER — Telehealth: Payer: Self-pay

## 2014-03-31 NOTE — Telephone Encounter (Signed)
Pt had a referral for a colonoscopy and they are scheduling in December and she is wanting to know if we can have this moved up sooner

## 2014-03-31 NOTE — Telephone Encounter (Signed)
Lm for pt to call the GI and try to schedule the appt around her schedule- this is a colonoscopy for routine screening.

## 2014-04-05 ENCOUNTER — Other Ambulatory Visit: Payer: Self-pay | Admitting: Emergency Medicine

## 2014-04-06 ENCOUNTER — Encounter: Payer: Self-pay | Admitting: Internal Medicine

## 2014-04-09 ENCOUNTER — Ambulatory Visit
Admission: RE | Admit: 2014-04-09 | Discharge: 2014-04-09 | Disposition: A | Discharge status: Home / Self Care | Payer: Managed Care, Other (non HMO) | Source: Ambulatory Visit | Attending: Family Medicine | Admitting: Family Medicine

## 2014-04-09 ENCOUNTER — Telehealth: Payer: Self-pay

## 2014-04-09 DIAGNOSIS — R102 Pelvic and perineal pain: Secondary | ICD-10-CM

## 2014-04-09 DIAGNOSIS — R140 Abdominal distension (gaseous): Secondary | ICD-10-CM

## 2014-04-09 NOTE — Telephone Encounter (Signed)
Swelling in the right leg has gotten worse and would like for Dr. Patsy Lageropland to give her a call to see if she needs to come in to be evaluated for this. She also said we should be receiving some lab results today from another office for Dr. Patsy Lageropland to review. She would like to discuss the results as well.

## 2014-04-09 NOTE — Telephone Encounter (Signed)
Clld pt - LMOVM on cell advising that she would need to RTC to be reevaluated. Pt was also advsd to bring a copy of the lab results that she would like Dr. Patsy Lageropland to review and to discuss.

## 2014-04-15 ENCOUNTER — Encounter (HOSPITAL_COMMUNITY): Payer: Self-pay

## 2014-04-15 ENCOUNTER — Ambulatory Visit (INDEPENDENT_AMBULATORY_CARE_PROVIDER_SITE_OTHER): Payer: Managed Care, Other (non HMO) | Admitting: Family Medicine

## 2014-04-15 VITALS — BP 148/100 | HR 98 | Temp 98.2°F | Resp 18 | Ht 62.25 in | Wt 158.8 lb

## 2014-04-15 DIAGNOSIS — M79661 Pain in right lower leg: Secondary | ICD-10-CM

## 2014-04-15 DIAGNOSIS — M79604 Pain in right leg: Secondary | ICD-10-CM

## 2014-04-15 DIAGNOSIS — R140 Abdominal distension (gaseous): Secondary | ICD-10-CM

## 2014-04-15 DIAGNOSIS — R635 Abnormal weight gain: Secondary | ICD-10-CM

## 2014-04-15 DIAGNOSIS — M7989 Other specified soft tissue disorders: Secondary | ICD-10-CM

## 2014-04-15 MED ORDER — HYDROCHLOROTHIAZIDE 25 MG PO TABS: ORAL_TABLET | ORAL | Status: DC

## 2014-04-15 NOTE — Patient Instructions (Signed)
[facility] go to [facility] for [facility].  Your appointment is at 5pm but I hope they can do it earlier.  I will call you tonight with your results. Then I think we should proceed with CT scans of your chest, abdomen and pelvis.    You can use the HCTZ (diuretic) as needed for swelling assuming there is no clot.  Take 1/2 or 1 daily as needed for swelling.  This will also help with your blood pressure

## 2014-04-15 NOTE — Progress Notes (Signed)
[facility] [address], [place] [id number] [phone]  Date:  04/15/2014   Name:  Brenda CaulDelana L Rice   DOB:  07/09/1955   MRN:  [id number]  PCP:  Abbe AmsterdamOPLAND,Ila Landowski, MD    Chief Complaint: Follow-up, Leg Swelling and Leg Pain   History of Present Illness:  Brenda Rice is a 58 y.o. very pleasant female patient who presents with the following:  She is here today because she notes more swelling in her abdomen and swelling/ pain in right right leg.  The foot is not getting better overnight any more, it is swollen all the time but hurts gets worse as the day goes on.  She states that her leg has been worse for about 2 weeks, and it is now painful.   She continues to gain some weight and feels that her belly is bloated and uncomfortable.    Patient Active Problem List   Diagnosis Date Noted  . MDD (major depressive disorder) 10/06/2013  . Mild benzodiazepine use disorder 10/06/2013  . Acute respiratory failure 09/20/2013  . Alcohol dependence 05/26/2013  . GAD (generalized anxiety disorder) 05/26/2013  . Depressive disorder 05/26/2013  . Benzodiazepine dependence 05/25/2013  . Alcohol dependence syndrome 02/15/2013  . Suicide attempt by multiple drug overdose 02/15/2013  . Substance induced mood disorder 02/15/2013  . Nicotine dependence 02/15/2013  . COPD (chronic obstructive pulmonary disease) 02/15/2013  . Hyponatremia 07/05/2012  . Atypical chest pain 07/05/2012  . Nicotine addiction 09/02/2011  . Hypertension 07/12/2011  . Benzodiazepine overdose 07/11/2011  . 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 Respiratory failure 07/09/2011  . Overdose of benzodiazepine 07/09/2011    Past Medical History  Diagnosis Date  . Hypertension   . Depression   . Suicide attempt     x 3  . Osteoarthritis     right hip  . Fx clavicle     Hx of left clavicle fx  . Hx: UTI (urinary tract infection)     Past Surgical  History  Procedure Laterality Date  . Orif left clavicle fracture and pinning 2nd metacarpal  01/03/2010    History  Substance Use Topics  . Smoking status: Current Every Day Smoker -- 1.50 packs/day for 44 years    Types: Cigarettes  . Smokeless tobacco: Never Used  . Alcohol Use: 0.6 oz/week    0 Shots of liquor, 1 Cans of beer per week     Comment: was drinking beer daily now down to total 1.5 cans a week, quit hard liquor 1 month ago    Family History  Problem Relation Age of Onset  . ADD / ADHD Neg Hx   . Anxiety disorder Neg Hx   . Alcohol abuse Neg Hx   . Bipolar disorder Neg Hx   . Dementia Neg Hx   . Depression Neg Hx   . Drug abuse Neg Hx   . OCD Neg Hx   . Schizophrenia Neg Hx     No Known Allergies  Medication list has been reviewed and updated.  Current Outpatient Prescriptions on File Prior to Visit  Medication Sig Dispense Refill  . ALPRAZolam (XANAX) 1 MG tablet Take 1 tablet (1 mg total) by mouth 2 (two) times daily.  60 tablet  5  . citalopram (CELEXA) 20 MG tablet Take 1 tablet (20 mg total) by mouth at bedtime.  45 tablet  2  . Hyprom-Naphaz-Polysorb-Zn Sulf (CLEAR EYES COMPLETE OP) Apply 1  drop to eye as needed (For dry eyes).      Marland Kitchen. ibuprofen (ADVIL,MOTRIN) 200 MG tablet Take 400 mg by mouth every 6 (six) hours as needed for moderate pain.      Marland Kitchen. lisinopril (PRINIVIL,ZESTRIL) 40 MG tablet Take 1 tablet (40 mg total) by mouth daily. For high blood pressure control  90 tablet  1  . QUEtiapine (SEROQUEL) 50 MG tablet TAKE 1 TABLET 50 MG TOTAL BY MOUTH AT BEDTIME  NEEDS APPOINTMENT ADDITIONAL REFILLS  30 tablet  1   No current facility-administered medications on file prior to visit.    Review of Systems:  As per HPI- otherwise negative.  Wt Readings from Last 3 Encounters:  04/15/14 158 lb 12.8 oz (72.031 kg)  03/10/14 155 lb (70.308 kg)  01/10/14 149 lb (67.586 kg)   BP Readings from Last 3 Encounters:  04/15/14 184/90  03/10/14 154/86   01/10/14 132/78     Physical Examination: Filed Vitals:   04/15/14 1512  BP: 184/90  Pulse: 98  Temp: 98.2 F (36.8 C)  Resp: 18   Filed Vitals:   04/15/14 1512  Height: 5' 2.25" (1.581 m)  Weight: 158 lb 12.8 oz (72.031 kg)   Body mass index is 28.82 kg/(m^2). Ideal Body Weight: Weight in (lb) to have BMI = 25: 137.5  GEN: WDWN, NAD, Non-toxic, A & O x 3, looks well HEENT: Atraumatic, Normocephalic. Neck supple. No masses, No LAD. Ears and Nose: No external deformity. CV: RRR, No M/G/R. No JVD. No thrill. No extra heart sounds. PULM: CTA B, no wheezes, crackles, rhonchi. No retractions. No resp. distress. No accessory muscle use. ABD: S, NT, ND. No rebound. No HSM. EXTR: No c/c/e NEURO Normal gait.  PSYCH: Normally interactive. Conversant. Not depressed or anxious appearing.  Calm demeanor. She has minimal swelling of the right ankle and lower shin.  The edematous area is also tender. The left shows trace to no edema,  Calves measure equally bilaterally She does have some tenderness over the right hip- she is known to have significant to severe OA of this hip.    Assessment and Plan: Pain and swelling of right lower leg - Plan: Lower Extremity Venous Duplex, hydrochlorothiazide (HYDRODIURIL) 25 MG tablet  Abdominal bloating  Abnormal weight gain  She has been in to see me a few times with complaint of abdominal bloating and abnormal weight gain.  ultrasound was unrevealing.  She has over 50 pack year history of smoking.  Arranged an US of her right leg to rule- out DVT now.  However she did not feel that she could go now and prefers to go tomorrow.  She understands that an untreated clot could be more likely to lead to a DVT.  rescheduled doppler for tomorrow.   Discussed her abdominal bloating.  We have not yet found any good explanation for this or for her weight gain.  Plan to do a CT scan to evaluate her chest, abdomen and pelvis and try to rule- our a  malignancy  rx for HCTZ for swelling in her feet.  This will also help with her BP Follow-up with doppler result tomorrow Signed Abbe AmsterdamJessica Senora Lacson, MD  She can do a CT scan on a Monday, Wednesday or Friday.  She is flexible on these days

## 2014-04-16 ENCOUNTER — Encounter (HOSPITAL_COMMUNITY): Payer: Self-pay

## 2014-04-22 ENCOUNTER — Telehealth: Payer: Self-pay

## 2014-04-22 DIAGNOSIS — M1611 Unilateral primary osteoarthritis, right hip: Secondary | ICD-10-CM

## 2014-04-22 NOTE — Telephone Encounter (Signed)
The patient called to request that Dr. Lorelei Pont refer her to an orthopedic surgeon for her hip replacement.  The patient would like to get this scheduled as soon as possible because she has met her deductible and out of pocket for 2015 and needs to have the surgery prior to 06/24/14.  The patient requests return call from Dr. Lorelei Pont at [phone].

## 2014-04-23 NOTE — Telephone Encounter (Signed)
Called but phone kept cutting me off after her outgoing message.  Will go ahead and do referral for her

## 2014-05-02 ENCOUNTER — Other Ambulatory Visit: Payer: Self-pay | Admitting: Emergency Medicine

## 2014-05-05 ENCOUNTER — Ambulatory Visit (HOSPITAL_COMMUNITY)
Admission: RE | Admit: 2014-05-05 | Discharge: 2014-05-05 | Disposition: A | Discharge status: Home / Self Care | Payer: Managed Care, Other (non HMO) | Source: Ambulatory Visit | Attending: Family Medicine | Admitting: Family Medicine

## 2014-05-05 ENCOUNTER — Ambulatory Visit (INDEPENDENT_AMBULATORY_CARE_PROVIDER_SITE_OTHER): Payer: Managed Care, Other (non HMO) | Admitting: Family Medicine

## 2014-05-05 VITALS — BP 166/88 | HR 93 | Temp 98.4°F | Resp 16 | Ht 63.5 in | Wt 152.6 lb

## 2014-05-05 DIAGNOSIS — G4452 New daily persistent headache (NDPH): Secondary | ICD-10-CM | POA: Insufficient documentation

## 2014-05-05 DIAGNOSIS — R42 Dizziness and giddiness: Secondary | ICD-10-CM | POA: Diagnosis not present

## 2014-05-05 DIAGNOSIS — R110 Nausea: Secondary | ICD-10-CM | POA: Diagnosis not present

## 2014-05-05 DIAGNOSIS — I10 Essential (primary) hypertension: Secondary | ICD-10-CM

## 2014-05-05 DIAGNOSIS — G43009 Migraine without aura, not intractable, without status migrainosus: Secondary | ICD-10-CM

## 2014-05-05 LAB — CBC
HCT: 37.1 % (ref 36.0–46.0)
Hemoglobin: 13.2 g/dL (ref 12.0–15.0)
MCH: 32.7 pg (ref 26.0–34.0)
MCHC: 35.6 g/dL (ref 30.0–36.0)
MCV: 91.8 fL (ref 78.0–100.0)
Platelets: 255 10*3/uL (ref 150–400)
RBC: 4.04 MIL/uL (ref 3.87–5.11)
RDW: 12.6 % (ref 11.5–15.5)
WBC: 10.6 10*3/uL — AB (ref 4.0–10.5)

## 2014-05-05 MED ORDER — AMLODIPINE BESYLATE 5 MG PO TABS
5.0000 mg | ORAL_TABLET | Freq: Every day | ORAL | Status: DC
Start: 2014-05-05 — End: 2014-06-08

## 2014-05-05 MED ORDER — BUTALBITAL-APAP-CAFFEINE 50-325-40 MG PO TABS: 1.0000 | ORAL_TABLET | Freq: Four times a day (QID) | ORAL | Status: DC | PRN

## 2014-05-05 NOTE — Progress Notes (Addendum)
[facility] [address], [place] [address] [phone]  Date:  05/05/2014   Name:  Brenda Rice   DOB:  04/12/1956   MRN:  [id number]  PCP:  Brenda AmsterdamOPLAND,Brenda Rice    Chief Complaint: Migraine; Nausea; and Blurred Vision   History of Present Illness:  Brenda Rice is a 58 y.o. very pleasant female patient who presents with the following:  History of mental and physical health problems as below.  Last seen by myself on 10/22- at that time she noted weight gain, abd bloating, right leg swelling.  She did not go to her leg ultrasound or abd/ pelvis CT that we arranged to evaluate these issues.  However she states she was actually called today and plans to do these tests soon.   She is here today with "a migraine since Sunday."  Today is Wednesday.  She may get migraines sometimes but this is more persistent than usual.  She does feel this is the worst HA of her life.   She notes blurry vision- she has not had this with her migraine HA in the past.   Her stomach feels upset She has noted nausea and vomiting.  She has mostly only been able to drink water over the last few days.  Yesterday she did eat a little soup.   She has not noted a fever.   She has not noted any particular bloating, but "I have to hold my stomach to stop it from burning."  She has tried tums and alka- seltzer.   She is taking goody powder, ibuprofen, excedrin migraine. None of this seems to help.   So far today she took aspirin this am.    Wt Readings from Last 3 Encounters:  05/05/14 152 lb 9.6 oz (69.219 kg)  04/15/14 158 lb 12.8 oz (72.031 kg)  03/10/14 155 lb (70.308 kg)     Patient Active Problem List   Diagnosis Date Noted  . MDD (major depressive disorder) 10/06/2013  . Mild benzodiazepine use disorder 10/06/2013  . Acute respiratory failure 09/20/2013  . Alcohol dependence 05/26/2013  . GAD (generalized anxiety disorder) 05/26/2013  . Depressive disorder 05/26/2013  .  Benzodiazepine dependence 05/25/2013  . Alcohol dependence syndrome 02/15/2013  . Suicide attempt by multiple drug overdose 02/15/2013  . Substance induced mood disorder 02/15/2013  . Nicotine dependence 02/15/2013  . COPD (chronic obstructive pulmonary disease) 02/15/2013  . Hyponatremia 07/05/2012  . Atypical chest pain 07/05/2012  . Nicotine addiction 09/02/2011  . Hypertension 07/12/2011  . Benzodiazepine overdose 07/11/2011  . 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 Respiratory failure 07/09/2011  . Overdose of benzodiazepine 07/09/2011    Past Medical History  Diagnosis Date  . Hypertension   . Depression   . Suicide attempt     x 3  . Osteoarthritis     right hip  . Fx clavicle     Hx of left clavicle fx  . Hx: UTI (urinary tract infection)     Past Surgical History  Procedure Laterality Date  . Orif left clavicle fracture and pinning 2nd metacarpal  01/03/2010    History  Substance Use Topics  . Smoking status: Current Every Day Smoker -- 1.50 packs/day for 44 years    Types: Cigarettes  . Smokeless tobacco: Never Used  . Alcohol Use: 0.6 oz/week    0 Shots of liquor, 1 Cans of beer per week     Comment: was  drinking beer daily now down to total 1.5 cans a week, quit hard liquor 1 month ago    Family History  Problem Relation Age of Onset  . ADD / ADHD Neg Hx   . Anxiety disorder Neg Hx   . Alcohol abuse Neg Hx   . Bipolar disorder Neg Hx   . Dementia Neg Hx   . Depression Neg Hx   . Drug abuse Neg Hx   . OCD Neg Hx   . Schizophrenia Neg Hx     No Known Allergies  Medication list has been reviewed and updated.  Current Outpatient Prescriptions on File Prior to Visit  Medication Sig Dispense Refill  . ALPRAZolam (XANAX) 1 MG tablet Take 1 tablet (1 mg total) by mouth 2 (two) times daily. 60 tablet 5  . citalopram (CELEXA) 20 MG tablet TAKE 1 TABLET (20 MG TOTAL) BY MOUTH AT BEDTIME. 30 tablet 0  .  hydrochlorothiazide (HYDRODIURIL) 25 MG tablet Take 1/2 or 1 daily as needed for swelling 30 tablet 3  . Hyprom-Naphaz-Polysorb-Zn Sulf (CLEAR EYES COMPLETE OP) Apply 1 drop to eye as needed (For dry eyes).    Marland Kitchen. ibuprofen (ADVIL,MOTRIN) 200 MG tablet Take 400 mg by mouth every 6 (six) hours as needed for moderate pain.    Marland Kitchen. lisinopril (PRINIVIL,ZESTRIL) 40 MG tablet Take 1 tablet (40 mg total) by mouth daily. For high blood pressure control 90 tablet 1  . QUEtiapine (SEROQUEL) 50 MG tablet TAKE 1 TABLET 50 MG TOTAL BY MOUTH AT BEDTIME  NEEDS APPOINTMENT ADDITIONAL REFILLS 30 tablet 1   No current facility-administered medications on file prior to visit.    Review of Systems:  As per HPI- otherwise negative.   Physical Examination: Filed Vitals:   05/05/14 1339  BP: 166/88  Pulse: 93  Temp: 98.4 F (36.9 C)  Resp: 16   Filed Vitals:   05/05/14 1339  Height: 5' 3.5" (1.613 m)  Weight: 152 lb 9.6 oz (69.219 kg)   Body mass index is 26.6 kg/(m^2). Ideal Body Weight: Weight in (lb) to have BMI = 25: 143.1  GEN: WDWN, NAD, Non-toxic, A & O x 3, appears to have a headache HEENT: Atraumatic, Normocephalic. Neck supple. No masses, No LAD. Bilateral TM wnl, oropharynx normal.  PEERL,EOMI.   Ears and Nose: No external deformity. CV: RRR, No M/G/R. No JVD. No thrill. No extra heart sounds. PULM: CTA B, no wheezes, crackles, rhonchi. No retractions. No resp. distress. No accessory muscle use. ABD: S, NT, ND, +BS. No rebound. No HSM. EXTR: No c/c/e NEURO Normal gait.  PSYCH: Normally interactive. Conversant. Not depressed or anxious appearing.  Calm demeanor.  Normal strength, sensation and DTR all extremities. Negative romberg.    Assessment and Plan: Nonintractable migraine, unspecified migraine type - Plan: butalbital-acetaminophen-caffeine (FIORICET) 50-325-40 MG per tablet  Essential hypertension - Plan: Basic metabolic panel, CBC, CT Head Wo Contrast, amLODipine (NORVASC) 5 MG  tablet, CANCELED: CT Head Wo Contrast  New daily persistent headache - Plan: Basic metabolic panel, CBC, CT Head Wo Contrast, CANCELED: CT Head Wo Contrast  Essential hypertension, Chronic - Plan: Basic metabolic panel, CBC, CT Head Wo Contrast, amLODipine (NORVASC) 5 MG tablet, CANCELED: CT Head Wo Contrast  Here today with her worse headache of life.  Will refer for a CT of her head to help rule- out any more dangerous pathology. Also noted that her BP has not been under optimal control- this could be contributing to her HTN.  Will add 5mg   of norvasc to her current regimen.  Will call following her CT -assuming negative she will use fiorcet as needed today.  If HA persists tomorrow she will return for a shot of toradol and perhaps phenergan  BP Readings from Last 3 Encounters:  05/05/14 166/88  04/15/14 148/100  03/10/14 154/86    Meds ordered this encounter  Medications  . amLODipine (NORVASC) 5 MG tablet    Sig: Take 1 tablet (5 mg total) by mouth daily.    Dispense:  90 tablet    Refill:  3  . butalbital-acetaminophen-caffeine (FIORICET) 50-325-40 MG per tablet    Sig: Take 1-2 tablets by mouth every 6 (six) hours as needed for headache. Max 6 per day    Dispense:  20 tablet    Refill:  0    Signed Brenda Amsterdam, Rice She will also send me paperwork for being out on FMLA for this headche this past Monday through today.  She hopes to RTW tomorrow  Received CT report:called her and went over results.  Counseled her to take just half her usual xanax dose if also using fiorcet.  She will come in tomorrow for toradol if needed- she does not want to come back this evening as she does not have a ride.    CT HEAD WITHOUT CONTRAST  TECHNIQUE: Contiguous axial images were obtained from the base of the skull through the vertex without intravenous contrast. COMPARISON: 10/28/2013  FINDINGS: Normal ventricular morphology. No midline shift or mass effect. Small vessel chronic  ischemic changes of deep cerebral white matter. Again identified hyperdense nodule at the foramen of Monro 4 mm diameter consistent with a small colloid cyst. No intracranial hemorrhage, additional mass lesion or evidence acute infarction. No extra-axial fluid collections. Atherosclerotic calcifications at the carotid siphons. No acute bone or sinus abnormalities.  IMPRESSION: Atrophy with small vessel chronic ischemic changes of deep cerebral white matter. 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 No acute intracranial abnormalities.

## 2014-05-05 NOTE — Patient Instructions (Addendum)
[facility] and check in at admitting. Your appt is at 3:30 pm.  After your CT you can go home and I will call you.   Try the fioricet for your headache- rest and try to avoid sounds and bright lights.   Let me know if you do not feel better! Also we are going to add 5mg  of amlodipine (norvasc) for your blood pressure.

## 2014-05-06 ENCOUNTER — Inpatient Hospital Stay (HOSPITAL_COMMUNITY)
Admission: AD | Admit: 2014-05-06 | Discharge: 2014-05-12 | DRG: 645 | Disposition: A | Discharge status: Home / Self Care | Payer: Managed Care, Other (non HMO) | Source: Ambulatory Visit | Attending: Family Medicine | Admitting: Family Medicine

## 2014-05-06 ENCOUNTER — Ambulatory Visit (INDEPENDENT_AMBULATORY_CARE_PROVIDER_SITE_OTHER): Payer: Managed Care, Other (non HMO) | Admitting: Family Medicine

## 2014-05-06 ENCOUNTER — Telehealth: Payer: Self-pay | Admitting: Family Medicine

## 2014-05-06 VITALS — BP 118/70 | HR 90 | Temp 98.4°F

## 2014-05-06 DIAGNOSIS — F17210 Nicotine dependence, cigarettes, uncomplicated: Secondary | ICD-10-CM | POA: Diagnosis present

## 2014-05-06 DIAGNOSIS — R197 Diarrhea, unspecified: Secondary | ICD-10-CM | POA: Diagnosis present

## 2014-05-06 DIAGNOSIS — M1611 Unilateral primary osteoarthritis, right hip: Secondary | ICD-10-CM | POA: Diagnosis present

## 2014-05-06 DIAGNOSIS — R1013 Epigastric pain: Secondary | ICD-10-CM | POA: Diagnosis present

## 2014-05-06 DIAGNOSIS — F39 Unspecified mood [affective] disorder: Secondary | ICD-10-CM | POA: Diagnosis present

## 2014-05-06 DIAGNOSIS — J449 Chronic obstructive pulmonary disease, unspecified: Secondary | ICD-10-CM | POA: Diagnosis present

## 2014-05-06 DIAGNOSIS — E222 Syndrome of inappropriate secretion of antidiuretic hormone: Principal | ICD-10-CM | POA: Diagnosis present

## 2014-05-06 DIAGNOSIS — F1020 Alcohol dependence, uncomplicated: Secondary | ICD-10-CM | POA: Diagnosis present

## 2014-05-06 DIAGNOSIS — E871 Hypo-osmolality and hyponatremia: Secondary | ICD-10-CM

## 2014-05-06 DIAGNOSIS — F411 Generalized anxiety disorder: Secondary | ICD-10-CM | POA: Diagnosis present

## 2014-05-06 DIAGNOSIS — Z79899 Other long term (current) drug therapy: Secondary | ICD-10-CM | POA: Diagnosis not present

## 2014-05-06 DIAGNOSIS — F328 Other depressive episodes: Secondary | ICD-10-CM

## 2014-05-06 DIAGNOSIS — E876 Hypokalemia: Secondary | ICD-10-CM | POA: Diagnosis present

## 2014-05-06 DIAGNOSIS — Z79891 Long term (current) use of opiate analgesic: Secondary | ICD-10-CM | POA: Diagnosis not present

## 2014-05-06 DIAGNOSIS — I10 Essential (primary) hypertension: Secondary | ICD-10-CM | POA: Diagnosis present

## 2014-05-06 DIAGNOSIS — F1021 Alcohol dependence, in remission: Secondary | ICD-10-CM

## 2014-05-06 DIAGNOSIS — R112 Nausea with vomiting, unspecified: Secondary | ICD-10-CM | POA: Diagnosis present

## 2014-05-06 DIAGNOSIS — F329 Major depressive disorder, single episode, unspecified: Secondary | ICD-10-CM | POA: Diagnosis present

## 2014-05-06 DIAGNOSIS — R51 Headache: Secondary | ICD-10-CM | POA: Diagnosis present

## 2014-05-06 LAB — COMPREHENSIVE METABOLIC PANEL
ALT: 19 U/L (ref 0–35)
AST: 29 U/L (ref 0–37)
Albumin: 3.7 g/dL (ref 3.5–5.2)
Alkaline Phosphatase: 102 U/L (ref 39–117)
Anion gap: 16 — ABNORMAL HIGH (ref 5–15)
BILIRUBIN TOTAL: 0.6 mg/dL (ref 0.3–1.2)
BUN: 5 mg/dL — ABNORMAL LOW (ref 6–23)
CHLORIDE: 70 meq/L — AB (ref 96–112)
CO2: 26 mEq/L (ref 19–32)
Calcium: 9.3 mg/dL (ref 8.4–10.5)
Creatinine, Ser: 0.49 mg/dL — ABNORMAL LOW (ref 0.50–1.10)
GFR calc Af Amer: >90 mL/min (ref >90)
GFR calc non Af Amer: >90 mL/min (ref >90)
Glucose, Bld: 93 mg/dL (ref 70–99)
POTASSIUM: 3.4 meq/L — AB (ref 3.7–5.3)
SODIUM: 112 meq/L — AB (ref 137–147)
TOTAL PROTEIN: 7.5 g/dL (ref 6.0–8.3)

## 2014-05-06 LAB — RAPID URINE DRUG SCREEN, HOSP PERFORMED
Amphetamines: NOT DETECTED
BARBITURATES: POSITIVE — AB
Benzodiazepines: POSITIVE — AB
COCAINE: NOT DETECTED
Opiates: NOT DETECTED
Tetrahydrocannabinol: NOT DETECTED

## 2014-05-06 LAB — CREATININE, URINE, RANDOM: CREATININE, URINE: 52.59 mg/dL

## 2014-05-06 LAB — CBC
HCT: 35.1 % — ABNORMAL LOW (ref 36.0–46.0)
HEMOGLOBIN: 13.2 g/dL (ref 12.0–15.0)
MCH: 34 pg (ref 26.0–34.0)
MCHC: 37.6 g/dL — AB (ref 30.0–36.0)
MCV: 90.5 fL (ref 78.0–100.0)
Platelets: 225 10*3/uL (ref 150–400)
RBC: 3.88 MIL/uL (ref 3.87–5.11)
RDW: 11.8 % (ref 11.5–15.5)
WBC: 11.8 10*3/uL — ABNORMAL HIGH (ref 4.0–10.5)

## 2014-05-06 LAB — BASIC METABOLIC PANEL
BUN: 5 mg/dL — ABNORMAL LOW (ref 6–23)
BUN: 7 mg/dL (ref 6–23)
CHLORIDE: 74 meq/L — AB (ref 96–112)
CO2: 25 mEq/L (ref 19–32)
CO2: 24 meq/L (ref 19–32)
Calcium: 8.9 mg/dL (ref 8.4–10.5)
Calcium: 9.3 mg/dL (ref 8.4–10.5)
Chloride: 68 mEq/L — ABNORMAL LOW (ref 96–112)
Creat: 0.53 mg/dL (ref 0.50–1.10)
Creat: 0.53 mg/dL (ref 0.50–1.10)
Glucose, Bld: 86 mg/dL (ref 70–99)
Glucose, Bld: 103 mg/dL — ABNORMAL HIGH (ref 70–99)
POTASSIUM: 3.3 meq/L — AB (ref 3.5–5.3)
Potassium: 3.4 mEq/L — ABNORMAL LOW (ref 3.5–5.3)
Sodium: <110 mEq/L — ABNORMAL LOW (ref 135–145)
Sodium: 110 mEq/L — ABNORMAL LOW (ref 135–145)

## 2014-05-06 LAB — PROTIME-INR
INR: 1.06 (ref 0.00–1.49)
Prothrombin Time: 13.9 seconds (ref 11.6–15.2)

## 2014-05-06 LAB — TSH: TSH: 0.568 u[IU]/mL (ref 0.350–4.500)

## 2014-05-06 LAB — LIPASE, BLOOD: Lipase: 21 U/L (ref 11–59)

## 2014-05-06 LAB — NA AND K (SODIUM & POTASSIUM), RAND UR: Potassium Urine: 12 mEq/L

## 2014-05-06 LAB — ETHANOL: ALCOHOL ETHYL (B): 11 mg/dL (ref 0–11)

## 2014-05-06 MED ORDER — SODIUM CHLORIDE 0.9 % IJ SOLN
3.0000 mL | Freq: Two times a day (BID) | INTRAMUSCULAR | Status: DC
  Administered 2014-05-06 – 2014-05-08 (×4): 3 mL via INTRAVENOUS

## 2014-05-06 MED ORDER — LORAZEPAM 1 MG PO TABS: 1.0000 mg | ORAL_TABLET | Freq: Four times a day (QID) | ORAL | Status: AC | PRN

## 2014-05-06 MED ORDER — ACETAMINOPHEN 325 MG PO TABS
650.0000 mg | ORAL_TABLET | Freq: Four times a day (QID) | ORAL | Status: DC | PRN
  Administered 2014-05-07 – 2014-05-11 (×5): 650 mg via ORAL
  Filled 2014-05-06 (×5): qty 2

## 2014-05-06 MED ORDER — ADULT MULTIVITAMIN W/MINERALS CH
1.0000 | ORAL_TABLET | Freq: Every day | ORAL | Status: DC
  Administered 2014-05-06 – 2014-05-12 (×7): 1 via ORAL
  Filled 2014-05-06 (×7): qty 1

## 2014-05-06 MED ORDER — HEPARIN SODIUM (PORCINE) 5000 UNIT/ML IJ SOLN
5000.0000 [IU] | Freq: Three times a day (TID) | INTRAMUSCULAR | Status: DC
  Administered 2014-05-06 – 2014-05-11 (×16): 5000 [IU] via SUBCUTANEOUS
  Filled 2014-05-06 (×24): qty 1

## 2014-05-06 MED ORDER — ACETAMINOPHEN 650 MG RE SUPP: 650.0000 mg | Freq: Four times a day (QID) | RECTAL | Status: DC | PRN

## 2014-05-06 MED ORDER — THIAMINE HCL 100 MG/ML IJ SOLN
100.0000 mg | Freq: Every day | INTRAMUSCULAR | Status: DC
  Filled 2014-05-06 (×3): qty 1

## 2014-05-06 MED ORDER — LORAZEPAM 2 MG/ML IJ SOLN: 1.0000 mg | Freq: Four times a day (QID) | INTRAMUSCULAR | Status: AC | PRN

## 2014-05-06 MED ORDER — AMLODIPINE BESYLATE 5 MG PO TABS
5.0000 mg | ORAL_TABLET | Freq: Every day | ORAL | Status: DC
  Administered 2014-05-06 – 2014-05-12 (×7): 5 mg via ORAL
  Filled 2014-05-06 (×7): qty 1

## 2014-05-06 MED ORDER — VITAMIN B-1 100 MG PO TABS
100.0000 mg | ORAL_TABLET | Freq: Every day | ORAL | Status: DC
  Administered 2014-05-06 – 2014-05-12 (×7): 100 mg via ORAL
  Filled 2014-05-06 (×7): qty 1

## 2014-05-06 MED ORDER — NICOTINE 21 MG/24HR TD PT24
21.0000 mg | MEDICATED_PATCH | Freq: Every day | TRANSDERMAL | Status: DC
  Administered 2014-05-06 – 2014-05-11 (×6): 21 mg via TRANSDERMAL
  Filled 2014-05-06 (×7): qty 1

## 2014-05-06 MED ORDER — PROMETHAZINE HCL 25 MG PO TABS: 25.0000 mg | ORAL_TABLET | Freq: Three times a day (TID) | ORAL | Status: DC | PRN

## 2014-05-06 MED ORDER — SODIUM CHLORIDE 0.9 % IV SOLN: 250.0000 mL | INTRAVENOUS | Status: DC | PRN

## 2014-05-06 MED ORDER — ALPRAZOLAM 0.5 MG PO TABS
1.0000 mg | ORAL_TABLET | Freq: Two times a day (BID) | ORAL | Status: DC
  Administered 2014-05-06 – 2014-05-12 (×12): 1 mg via ORAL
  Filled 2014-05-06 (×12): qty 2

## 2014-05-06 MED ORDER — FOLIC ACID 1 MG PO TABS
1.0000 mg | ORAL_TABLET | Freq: Every day | ORAL | Status: DC
  Administered 2014-05-06 – 2014-05-12 (×7): 1 mg via ORAL
  Filled 2014-05-06 (×7): qty 1

## 2014-05-06 MED ORDER — SODIUM CHLORIDE 0.9 % IJ SOLN: 3.0000 mL | INTRAMUSCULAR | Status: DC | PRN

## 2014-05-06 NOTE — Patient Instructions (Signed)
I will give you a call this afternoon with your lab results.   You can use some phenergan if needed for nausea; this can make you feel sleepy.  If you take this with xanax take just a 1/2 of each Also do not combine with your fiocet.

## 2014-05-06 NOTE — Progress Notes (Signed)
[facility] [address], [place] [address] [phone]  Date:  05/06/2014   Name:  Brenda CaulDelana L Dooley   DOB:  03/16/1956   MRN:  [id number]  PCP:  Abbe AmsterdamOPLAND,Bernard Donahoo, MD    Chief Complaint: No chief complaint on file.   History of Present Illness:  Brenda Rice is a 58 y.o. very pleasant female patient who presents with the following:  I called Brenda Rice and asked her to come in today due to critically low sodium on her BMP from yesterday.  It seems this may be a lab error; I was not called by lab about this value but saw it this afternoon in my inbox. She feels much better today actually -does feel a little tired but she is able to eat again and her HA is much less  Patient Active Problem List   Diagnosis Date Noted  . MDD (major depressive disorder) 10/06/2013  . Mild benzodiazepine use disorder 10/06/2013  . Acute respiratory failure 09/20/2013  . Alcohol dependence 05/26/2013  . GAD (generalized anxiety disorder) 05/26/2013  . Depressive disorder 05/26/2013  . Benzodiazepine dependence 05/25/2013  . Alcohol dependence syndrome 02/15/2013  . Suicide attempt by multiple drug overdose 02/15/2013  . Substance induced mood disorder 02/15/2013  . Nicotine dependence 02/15/2013  . COPD (chronic obstructive pulmonary disease) 02/15/2013  . Hyponatremia 07/05/2012  . Atypical chest pain 07/05/2012  . Nicotine addiction 09/02/2011  . Hypertension 07/12/2011  . Benzodiazepine overdose 07/11/2011  . 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 Respiratory failure 07/09/2011  . Overdose of benzodiazepine 07/09/2011    Past Medical History  Diagnosis Date  . Hypertension   . Depression   . Suicide attempt     x 3  . Osteoarthritis     right hip  . Fx clavicle     Hx of left clavicle fx  . Hx: UTI (urinary tract infection)     Past Surgical History  Procedure Laterality Date  . Orif left clavicle fracture and pinning  2nd metacarpal  01/03/2010    History  Substance Use Topics  . Smoking status: Current Every Day Smoker -- 1.50 packs/day for 44 years    Types: Cigarettes  . Smokeless tobacco: Never Used  . Alcohol Use: 0.6 oz/week    0 Shots of liquor, 1 Cans of beer per week     Comment: was drinking beer daily now down to total 1.5 cans a week, quit hard liquor 1 month ago    Family History  Problem Relation Age of Onset  . ADD / ADHD Neg Hx   . Anxiety disorder Neg Hx   . Alcohol abuse Neg Hx   . Bipolar disorder Neg Hx   . Dementia Neg Hx   . Depression Neg Hx   . Drug abuse Neg Hx   . OCD Neg Hx   . Schizophrenia Neg Hx     No Known Allergies  Medication list has been reviewed and updated.  Current Outpatient Prescriptions on File Prior to Visit  Medication Sig Dispense Refill  . ALPRAZolam (XANAX) 1 MG tablet Take 1 tablet (1 mg total) by mouth 2 (two) times daily. 60 tablet 5  . amLODipine (NORVASC) 5 MG tablet Take 1 tablet (5 mg total) by mouth daily. 90 tablet 3  . butalbital-acetaminophen-caffeine (FIORICET) 50-325-40 MG per tablet Take 1-2 tablets by mouth every 6 (six) hours as needed for headache. Max 6 per day 20 tablet  0  . citalopram (CELEXA) 20 MG tablet TAKE 1 TABLET (20 MG TOTAL) BY MOUTH AT BEDTIME. 30 tablet 0  . hydrochlorothiazide (HYDRODIURIL) 25 MG tablet Take 1/2 or 1 daily as needed for swelling 30 tablet 3  . Hyprom-Naphaz-Polysorb-Zn Sulf (CLEAR EYES COMPLETE OP) Apply 1 drop to eye as needed (For dry eyes).    Marland Kitchen. ibuprofen (ADVIL,MOTRIN) 200 MG tablet Take 400 mg by mouth every 6 (six) hours as needed for moderate pain.    Marland Kitchen. lisinopril (PRINIVIL,ZESTRIL) 40 MG tablet Take 1 tablet (40 mg total) by mouth daily. For high blood pressure control 90 tablet 1  . QUEtiapine (SEROQUEL) 50 MG tablet TAKE 1 TABLET 50 MG TOTAL BY MOUTH AT BEDTIME  NEEDS APPOINTMENT ADDITIONAL REFILLS 30 tablet 1   No current facility-administered medications on file prior to visit.     Review of Systems:  As per HPI- otherwise negative.   Physical Examination: There were no vitals filed for this visit. There were no vitals filed for this visit. There is no weight on file to calculate BMI. Ideal Body Weight:    GEN: WDWN, NAD, Non-toxic, A & O x 3.  Looks much better today HEENT: Atraumatic, Normocephalic. Neck supple. No masses, No LAD. Ears and Nose: No external deformity. CV: RRR, No M/G/R. No JVD. No thrill. No extra heart sounds. PULM: CTA B, no wheezes, crackles, rhonchi. No retractions. No resp. distress. No accessory muscle use. ABD: S, NT, ND, +BS. No rebound. No HSM. EXTR: No c/c/e NEURO Normal gait.  PSYCH: Normally interactive. Conversant. Not depressed or anxious appearing.  Calm demeanor.   Results for orders placed or performed in visit on 05/06/14  Basic Metabolic Panel  Result Value Ref Range   Sodium 110 (L) 135 - 145 mEq/L   Potassium 3.4 (L) 3.5 - 5.3 mEq/L   Chloride 74 (L) 96 - 112 mEq/L   CO2 24 19 - 32 mEq/L   Glucose, Bld 103 (H) 70 - 99 mg/dL   BUN 7 6 - 23 mg/dL   Creat 1.610.53 0.960.50 - 0.451.10 mg/dL   Calcium 9.3 8.4 - 40.910.5 mg/dL     Assessment and Plan: Hyponatremia - Plan: Basic Metabolic Panel  Non-intractable vomiting with nausea, vomiting of unspecified type - Plan: promethazine (PHENERGAN) 25 MG tablet  She would like to try something for her nausea.  Advised not to take the phenergan until we talk about her labs- sent out STAT.   Her Na is actually quite low.  Called pt and let her know, she is willing to be admitted for further treatment.  Spoke with the resident on call- she will proceed to admitting for evaluation and treatment of severe hyponatremia.    Signed Abbe AmsterdamJessica Bradden Tadros, MD

## 2014-05-06 NOTE — H&P (Signed)
Family Medicine [facility] Ad[facility] History and Physical Service Pager: [phone]  Patient name: Brenda Rice Medical record number: [id number] Date of birth: 07/30/1955 Age: 58 y.o. Gender: female  Primary Care Provider: Abbe AmsterdamOPLAND,JESSICA, MD Consultants: none Code Status: full  Chief Complaint: nausea, fatigue, malaise, headache- hyponatremia from PCPs office  Assessment and Plan: Brenda Rice is a 58 y.o. female presenting with hyponatremia . PMH is significant for hypertension, major depressive disorder, generalized anxiety disorder, alcohol abuse, chronic benzodiazepine use, previous suicide attempt.   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  - Causes of SIADH can include SSRIs, Seroquel  - thiazide-induced is also possibility - labs: CBC, CMP, INR, urine Na/K/Cre, urine and serum osmolality, HIV, UDS, ethanol - For now will fluid restrict to less than 800 mL/day and recheck BMP in the morning - strict I's and O's  Hypertension - well controlled well-controlled currently ed currently - Hold HCTZ, lisinopril - PRN hydralazine  COPD - At baseline, no meds - 80+ pack year Hx of smoking - If other work up unrevealing consider malignancy and low dose CT chest  Alcohol abuse - States alcohol use is moderate - Check EtOH level - CIWA  Mood disorder - major depressive disorder, generalized anxiety disorder - Holding SSRI and seroquel - ambien if needed for sleep - Continue xanax at home dose   FEN/GI: fluid restrict, regular diet Prophylaxis: Heparin  Disposition: telemetry for work up and close monitoring while correcting sodium abnormality  History of Present Illness: Brenda Rice is a 58 y.o. female  presenting with 4-5 days of generalized malaise, headache, fatigue, dizziness, and nausea. She states that the problem started insidiously over the course of the day Sunday she sought help at her PCP's office 1 days ago.her sodium was found to be less than 110 there and so was repeated and to need to be persistently low. She was given Fioricet for her headache which improved slightly. She states that today she feels slightly better. She also has midepigastric burning pain. Her husband and she agree that she has not been tolerating by mouth's well for the last 3-4 days but that she's been extremely thirsty. She's been drinking more water than usual.  She states that she drinks 1-2 beers approximately 3-4 times per week. She smokes approximately 2 packs of cigarettes per day since she is about 58 years old  Review Of Systems: Per HPI, Otherwise 12 point review of systems was performed and was unremarkable.  Patient Active Problem List   Diagnosis Date Noted  . MDD (major depressive disorder) 10/06/2013  . Mild benzodiazepine use disorder 10/06/2013  . Acute respiratory failure 09/20/2013  . Alcohol dependence 05/26/2013  . GAD (generalized anxiety disorder) 05/26/2013  . Depressive disorder 05/26/2013  . Benzodiazepine dependence 05/25/2013  . Alcohol dependence syndrome 02/15/2013  . Suicide attempt by multiple drug overdose 02/15/2013  . Substance induced mood disorder 02/15/2013  . Nicotine dependence 02/15/2013  . COPD (chronic obstructive pulmonary disease) 02/15/2013  . Hyponatremia 07/05/2012  . Atypical chest pain 07/05/2012  . Nicotine addiction 09/02/2011  . Hypertension 07/12/2011  . Benzodiazepine overdose 07/11/2011  . 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 Respiratory failure 07/09/2011  . Overdose of  benzodiazepine 07/09/2011   Past Medical History: Past Medical History  Diagnosis Date  . Hypertension   . Depression   . Suicide  attempt     x 3  . Osteoarthritis     right hip  . Fx clavicle     Hx of left clavicle fx  . Hx: UTI (urinary tract infection)    Past Surgical History: Past Surgical History  Procedure Laterality Date  . Orif left clavicle fracture and pinning 2nd metacarpal  01/03/2010   Social History: History  Substance Use Topics  . Smoking status: Current Every Day Smoker -- 1.50 packs/day for 44 years    Types: Cigarettes  . Smokeless tobacco: Never Used  . Alcohol Use: 0.6 oz/week    0 Shots of liquor, 1 Cans of beer per week     Comment: was drinking beer daily now down to total 1.5 cans a week, quit hard liquor 1 month ago   Additional social history: Please also refer to relevant sections of EMR.  Family History: Family History  Problem Relation Age of Onset  . ADD / ADHD Neg Hx   . Anxiety disorder Neg Hx   . Alcohol abuse Neg Hx   . Bipolar disorder Neg Hx   . Dementia Neg Hx   . Depression Neg Hx   . Drug abuse Neg Hx   . OCD Neg Hx   . Schizophrenia Neg Hx    Allergies and Medications: No Known Allergies No current facility-administered medications on file prior to encounter.   Current Outpatient Prescriptions on File Prior to Encounter  Medication Sig Dispense Refill  . ALPRAZolam (XANAX) 1 MG tablet Take 1 tablet (1 mg total) by mouth 2 (two) times daily. 60 tablet 5  . amLODipine (NORVASC) 5 MG tablet Take 1 tablet (5 mg total) by mouth daily. 90 tablet 3  . butalbital-acetaminophen-caffeine (FIORICET) 50-325-40 MG per tablet Take 1-2 tablets by mouth every 6 (six) hours as needed for headache. Max 6 per day 20 tablet 0  . citalopram (CELEXA) 20 MG tablet TAKE 1 TABLET (20 MG TOTAL) BY MOUTH AT BEDTIME. 30 tablet 0  . hydrochlorothiazide (HYDRODIURIL) 25 MG tablet Take 1/2 or 1 daily as needed for swelling 30 tablet 3  . Hyprom-Naphaz-Polysorb-Zn Sulf (CLEAR EYES COMPLETE OP) Apply 1 drop to eye as needed (For dry eyes).    Marland Kitchen ibuprofen (ADVIL,MOTRIN) 200 MG tablet  Take 400 mg by mouth every 6 (six) hours as needed for moderate pain.    Marland Kitchen lisinopril (PRINIVIL,ZESTRIL) 40 MG tablet Take 1 tablet (40 mg total) by mouth daily. For high blood pressure control 90 tablet 1  . promethazine (PHENERGAN) 25 MG tablet Take 1 tablet (25 mg total) by mouth every 8 (eight) hours as needed for nausea or vomiting. 20 tablet 0  . QUEtiapine (SEROQUEL) 50 MG tablet TAKE 1 TABLET 50 MG TOTAL BY MOUTH AT BEDTIME  NEEDS APPOINTMENT ADDITIONAL REFILLS 30 tablet 1    Objective: There were no vitals taken for this visit. Exam: Gen: NAD, alert, cooperative with exam HEENT: NCAT, EOMI, to 3 beats of right beating nystagmus CV: RRR, good S1/S2, no murmur Resp: CTABL, no wheezes, non-labored Abd: very mild tenderness Ext: trace pitting edema bilaterally with right slightly worse than left Neuro: Alert and oriented, cranial nerve II through XII intact except for right-sided V1 distribution which has decreased sensation, strength 5/5 in bilateral upper and lower extremities   Labs and Imaging: CBC BMET   Recent Labs  Lab 05/05/14 1436  WBC 10.6*  HGB 13.2  HCT 37.1  PLT 255    Recent Labs Lab 05/06/14 1530  NA 110*  K 3.4*  CL 74*  CO2 24  BUN 7  CREATININE 0.53  GLUCOSE 103*  CALCIUM 9.3     EKG pending  Elenora GammaSamuel L Marguarite Markov, MD 05/06/2014, 8:00 PM PGY-3, [facility] FPTS Intern pager: [phone], text pages welcome

## 2014-05-06 NOTE — Telephone Encounter (Signed)
Call [facility]eland [facility] for direct admit for severe hyponatremia. Arrangements made and she will have nursing contact 2W for signout.   Murtis SinkSam Zaynah Chawla, MD [facility] Resident, PGY-3 05/06/2014, 5:54 PM

## 2014-05-07 ENCOUNTER — Encounter (HOSPITAL_COMMUNITY): Payer: Self-pay

## 2014-05-07 ENCOUNTER — Other Ambulatory Visit: Payer: Self-pay | Admitting: Family Medicine

## 2014-05-07 LAB — BASIC METABOLIC PANEL
ANION GAP: 15 (ref 5–15)
Anion gap: 14 (ref 5–15)
Anion gap: 16 — ABNORMAL HIGH (ref 5–15)
Anion gap: 15 (ref 5–15)
BUN: 5 mg/dL — AB (ref 6–23)
BUN: 8 mg/dL (ref 6–23)
BUN: 11 mg/dL (ref 6–23)
BUN: 12 mg/dL (ref 6–23)
CHLORIDE: 75 meq/L — AB (ref 96–112)
CHLORIDE: 79 meq/L — AB (ref 96–112)
CO2: 25 mEq/L (ref 19–32)
CO2: 25 mEq/L (ref 19–32)
CO2: 23 mEq/L (ref 19–32)
CO2: 24 mEq/L (ref 19–32)
CREATININE: 0.45 mg/dL — AB (ref 0.50–1.10)
CREATININE: 0.53 mg/dL (ref 0.50–1.10)
CREATININE: 0.56 mg/dL (ref 0.50–1.10)
Calcium: 9.3 mg/dL (ref 8.4–10.5)
Calcium: 9.2 mg/dL (ref 8.4–10.5)
Calcium: 9 mg/dL (ref 8.4–10.5)
Calcium: 9 mg/dL (ref 8.4–10.5)
Chloride: 75 mEq/L — ABNORMAL LOW (ref 96–112)
Chloride: 76 mEq/L — ABNORMAL LOW (ref 96–112)
Creatinine, Ser: 0.64 mg/dL (ref 0.50–1.10)
GFR calc Af Amer: >90 mL/min (ref >90)
GFR calc non Af Amer: >90 mL/min (ref >90)
Glucose, Bld: 103 mg/dL — ABNORMAL HIGH (ref 70–99)
Glucose, Bld: 98 mg/dL (ref 70–99)
Glucose, Bld: 100 mg/dL — ABNORMAL HIGH (ref 70–99)
Glucose, Bld: 104 mg/dL — ABNORMAL HIGH (ref 70–99)
Potassium: 3.7 mEq/L (ref 3.7–5.3)
Potassium: 3.4 mEq/L — ABNORMAL LOW (ref 3.7–5.3)
Potassium: 3.1 mEq/L — ABNORMAL LOW (ref 3.7–5.3)
Potassium: 4.2 mEq/L (ref 3.7–5.3)
Sodium: 115 mEq/L — CL (ref 137–147)
Sodium: 114 mEq/L — CL (ref 137–147)
Sodium: 115 mEq/L — CL (ref 137–147)
Sodium: 118 mEq/L — CL (ref 137–147)

## 2014-05-07 LAB — HIV ANTIBODY (ROUTINE TESTING W REFLEX): HIV: NONREACTIVE

## 2014-05-07 LAB — PROTEIN / CREATININE RATIO, URINE
CREATININE, URINE: 52.23 mg/dL
Protein Creatinine Ratio: 0.09 (ref 0.00–0.15)
TOTAL PROTEIN, URINE: 4.5 mg/dL

## 2014-05-07 LAB — OSMOLALITY: OSMOLALITY: 235 mosm/kg — AB (ref 275–300)

## 2014-05-07 LAB — OSMOLALITY, URINE: Osmolality, Ur: 177 mOsm/kg — ABNORMAL LOW (ref 390–1090)

## 2014-05-07 MED ORDER — CITALOPRAM HYDROBROMIDE 20 MG PO TABS
20.0000 mg | ORAL_TABLET | Freq: Every day | ORAL | Status: DC
  Administered 2014-05-08 – 2014-05-10 (×3): 20 mg via ORAL
  Filled 2014-05-07 (×5): qty 1

## 2014-05-07 MED ORDER — SODIUM CHLORIDE 0.9 % IV SOLN
INTRAVENOUS | Status: AC
Start: 2014-05-07 — End: 2014-05-08
  Administered 2014-05-07 – 2014-05-08 (×2): via INTRAVENOUS

## 2014-05-07 MED ORDER — POTASSIUM CHLORIDE CRYS ER 20 MEQ PO TBCR
40.0000 meq | EXTENDED_RELEASE_TABLET | Freq: Once | ORAL | Status: AC
  Administered 2014-05-07: 40 meq via ORAL
  Filled 2014-05-07: qty 2

## 2014-05-07 MED ORDER — BENZONATATE 100 MG PO CAPS
100.0000 mg | ORAL_CAPSULE | Freq: Two times a day (BID) | ORAL | Status: DC | PRN
  Administered 2014-05-09: 100 mg via ORAL
  Filled 2014-05-07 (×2): qty 1

## 2014-05-07 MED ORDER — SODIUM CHLORIDE 0.9 % IV SOLN
INTRAVENOUS | Status: DC
  Administered 2014-05-07: 21:00:00 via INTRAVENOUS

## 2014-05-07 MED ORDER — PANTOPRAZOLE SODIUM 20 MG PO TBEC
20.0000 mg | DELAYED_RELEASE_TABLET | Freq: Every day | ORAL | Status: DC
  Administered 2014-05-07 – 2014-05-12 (×6): 20 mg via ORAL
  Filled 2014-05-07 (×7): qty 1

## 2014-05-07 NOTE — Progress Notes (Signed)
Family Medicine Teaching Service Daily Progress Note Intern Pager: [phone]  Patient name: Brenda CaulDelana L Suastegui Medical record number: [id number] Date of birth: 01/06/1956 Age: 58 y.o. Gender: female  Primary Care Provider: Abbe AmsterdamOPLAND,JESSICA, MD Consultants: None Code Status: Full  Pt Overview and Major Events to Date:  11/12: Pt presenting with generalized malaise, HA, fatigue, dizziness, and nausea found to a Na of 110  Assessment and Plan: Brenda Rice is a 58 y.o. female presenting with hyponatremia . PMH is significant for hypertension, major depressive disorder, generalized anxiety disorder, alcohol abuse, chronic benzodiazepine use, previous suicide attempt.   #Hyponatremia: Appears euvolemic on exam. FeNa 0.1%.  - Will correct very slowly considering that her last known normal sodium was in September 2015, no more than 9 mEq Na per 24 hours correction. 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  Patient appears euvolemic on exam therefore cirrhosis vs cardiogenic causes less likely  - For now will fluid restrict to less than 800 mL/day  - Continue to monitor  - strict I's and O's  #Hypertension - well controlled well-controlled currently  - Hold HCTZ, lisinopril - PRN hydralazine  #COPD: Wheezing note on exam this AM. - At baseline, no meds - 80+ pack year Hx of smoking - If other work up unrevealing consider malignancy and low dose CT chest  #Alcohol abuse - States alcohol use is moderate - CIWA scores: 0-5  #Epigastric pain/recrrent emesis: Pt currently denies pain. Was supposed to have chest/abdominopelvic imaging as an outpatient but has not done. - Should have upper GI evaluation (barium upper GI vs small bowel follow through vs EGD) to investigate for PUD as well as biliary imaging.  #Mood disorder - major  depressive disorder, generalized anxiety disorder - Re-started Celexa and Seroquel today - ambien if needed for sleep - Continue xanax at home dose  FEN/GI: fluid restrict, regular diet, adding PPI due to GI complaints Prophylaxis: Heparin  Disposition: Pending improvement in Na  Subjective:  Patient feels her strength is better. Very thirsty. States she has nausea and vomiting. Also feels like she needs to burp and it is very uncomfortable.   Objective: Temp:  [98.4 F (36.9 C)-98.9 F (37.2 C)] 98.9 F (37.2 C) (11/13 0413) Pulse Rate:  [88-97] 97 (11/13 0600) Resp:  [18-20] 18 (11/13 0413) BP: (118-140)/(67-74) 125/72 mmHg (11/13 0600) SpO2:  [95 %-96 %] 96 % (11/13 0413) Weight:  [156 lb 8.4 oz (71 kg)] 156 lb 8.4 oz (71 kg) (11/12 2000)  11/12 0701 - 11/13 0700 In: 440 [P.O.:440] Out: 1750 [Urine:1750]   Physical Exam: Gen: NAD, alert, cooperative with exam HEENT: NCAT, EOMI, Dry mucus membranes, oropharynx clear  CV: RRR, good S1/S2, no murmur, rub, or gallop  Resp: Expiratory wheezes noted bilaterally, No crackles or rhonchi. No increased WOB Abd: Hyperactive BS. Soft, non-distended, No tenderness  Ext: No edema noted Neuro: Alert and oriented, speech clear and coherent. No gross neurologic deficit  Laboratory:  Recent Labs Lab 05/05/14 1436 05/06/14 2127  WBC 10.6* 11.8*  HGB 13.2 13.2  HCT 37.1 35.1*  PLT 255 225    Recent Labs Lab 05/06/14 1530 05/06/14 2127 05/07/14 0432  NA 110* 112* 115*  K 3.4* 3.4* 3.7  CL 74* 70* 75*  CO2 24 26 25   BUN 7 5* 5*  CREATININE 0.53 0.49* 0.45*  CALCIUM 9.3 9.3 9.3  PROT  --  7.5  --   BILITOT  --  0.6  --   ALKPHOS  --  102  --   ALT  --  19  --   AST  --  29  --   GLUCOSE 103* 93 103*  Serum Osm: 235 Urine osm: 177, K 12, Na <20, creatinine 52.59 INR 1.06 TSH 0.568  HIV: NR UDS +barbituates and benzodiazepines  Ethyl alcohol: 11     Imaging/Diagnostic Tests: None  Joanna Puffrystal S Dorsey,  MD 05/07/2014, 6:49 AM PGY-1, [facility] FPTS Intern pager: [phone], text pages welcome

## 2014-05-07 NOTE — Progress Notes (Signed)
CRITICAL VALUE ALERT  Critical value received:  Serum Osmolality 235  Date of notification:  05/06/14  Time of notification:  0219  Critical value read back: yes  Nurse who received alert:  Clovis FredricksonHayley Shelton RN  MD notified (1st page):  Ermalinda MemosBradshaw  Time of first page:  0220  Responding MD:  Ermalinda MemosBradshaw, No new orders received.

## 2014-05-07 NOTE — Discharge Summary (Signed)
Family Medicine Teaching [facility] Discharge Summary  Patient name: Brenda CaulDelana L Betley Medical record number: [id number] Date of birth: 04/01/1956 Age: 58 y.o. Gender: female Date of Admission: 05/06/2014  Date of Discharge: 05/12/14 Admitting Physician: Leighton Roachodd D McDiarmid, MD  Primary Care Provider: Abbe AmsterdamOPLAND,JESSICA, MD Consultants: Nephrology  Indication for Hospitalization: Symptomatic hyponatremia  Discharge Diagnoses/Problem List:  Hyponatremia  Hypokalemia  Hypertension  Alcohol abuse  Major Depressive disorder Generalized anxiety disorder   Disposition: Home  Discharge Condition: Stable, improved   Brief Hospital Course:  Brenda Rice is a 58 y/o female who presented from her PCP due to hyponatremia <110 with associated generalized malaise, headache, fatigue, dizziness, nausea, and emesis. She has also experienced excessive thirst.   On admission, she appeared euvolemic on exam. Urine electrolytes were obtained, her HCTZ, lisinopril, Celexa, and Seroquel were all initially held due to concerns for clouding the picture/SIADH inducing mechanisms. Her serum osm was low at 235 and her urine revealed a osm 177, K 12, Na <20, creatinine 52.59. She was initially fluid restricted to 800cc/day with some improvement in her Na to 118 and resolution of her symptoms, however then her Na change became stagnant.   At that point, there was an attempt to treat her as a hypovolemic hyponatremic patient with 100cc/hr NS on 11/13 with improvement of Na to 125. Unfortunately,  witheac attempt to decrease the fluid rate, her Na would decrease.     Nephrology was consulted on 11/16 and initially increased her NS to 100cc/hr. The subsequent day, fluids were stopped and she was stated on Lasix 10mg  daily. She became hypokalemic and was supplemented with KDUR, however her Na remained stable x24hrs off fluids.  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 On the day of discharge, the patient was restarted on her lisinopril, however her HCTZ was held.   Psych: The patient's Seroquel and Celexa were initially held.  Given the concerns for causing a decline in the patient's mood disorders, her Seroquel and Celexa were restarted on the evening of 11/14. Once nephrology was consulted, they were also concerned about the patient's continued use of Celexa given it's potential to cause SIADH. We dicussed the situation with the patient and she was discharged with a taper of Celexa.   Issues for Follow Up:  --She will need weekly labs starting this Friday (11/20) at her primary care provider's office to follow her sodium/potassium trend. --She should be on Lasix long-term and KDur for the next 10 days, with possible continued use if she continues to be hypokalemic. --No renal follow up is required, however she may be referred to Dr. Zetta BillsJay Patel if problems persist with hyponatremia. -- In rare cases, lung cancers can cause an SIADH like picture; given patient's history one could consider evaluation by low dose CT scan in the future.   Significant Procedures: None   Significant Labs and Imaging:   Recent Labs Lab 05/06/14 2127 05/11/14 0459  WBC 11.8* 7.4  HGB 13.2 10.9*  HCT 35.1* 32.0*  PLT 225 215    Recent Labs Lab 05/06/14 2127  05/10/14 0642 05/10/14 1551 05/11/14 0459 05/11/14 1727 05/12/14 0430  NA 112*  < > 129* 129* 129* 125* 128*  K 3.4*  < > 4.5 4.4 4.1 3.8 3.6*  CL 70*  < > 94* 93* 94* 89* 93*  CO2 26  < > 22 23 22 22  24  GLUCOSE 93  < > 95 89 92 92 93  BUN 5*  < > 4* 6 4* 6 5*  CREATININE 0.49*  < > 0.52 0.52 0.42* 0.50 0.47*  CALCIUM 9.3  < > 8.7 9.2 9.0 9.6 9.1  ALKPHOS 102  --   --   --   --   --   --   AST 29  --   --   --   --   --   --   ALT 19  --   --   --   --   --   --   ALBUMIN 3.7  --   --   --   --   --   --   < > = values in this interval not displayed.  Serum  Osm 11/12: 235 Urine 11/12: osm 177, K 12, Na <20, creatinine 52.59 Urine 11/16: osm 206, Na 53, creatinine 27.01 Serum Osm 11/16: 264  TSH: 0.932 Cortisol: 14.6 Urine Pro/Cre: 0.09 INR 1.06 TSH 0.568  HIV: NR UDS +barbituates and benzodiazepines  Ethyl alcohol: 11  C diff negative   Results/Tests Pending at Time of Discharge: None  Discharge Medications:    Medication List    STOP taking these medications        hydrochlorothiazide 25 MG tablet  Commonly known as:  HYDRODIURIL      TAKE these medications        albuterol 108 (90 BASE) MCG/ACT inhaler  Commonly known as:  PROVENTIL HFA;VENTOLIN HFA  Inhale 3 puffs into the lungs 2 (two) times daily as needed for wheezing or shortness of breath.     ALPRAZolam 1 MG tablet  Commonly known as:  XANAX  Take 1 tablet (1 mg total) by mouth 2 (two) times daily.     amLODipine 5 MG tablet  Commonly known as:  NORVASC  Take 1 tablet (5 mg total) by mouth daily.     butalbital-acetaminophen-caffeine 50-325-40 MG per tablet  Commonly known as:  FIORICET  Take 1-2 tablets by mouth every 6 (six) hours as needed for headache. Max 6 per day     citalopram 10 MG tablet  Commonly known as:  CELEXA  Please take 1 tablet every day for one week. Then take one tablet every other day for one week. You may then stop taking Celexa.     CLEAR EYES COMPLETE OP  Place 1 drop into both eyes daily as needed (dry eyes).     furosemide 20 MG tablet  Commonly known as:  LASIX  Take 0.5 tablets (10 mg total) by mouth daily.     ibuprofen 200 MG tablet  Commonly known as:  ADVIL,MOTRIN  Take 400 mg by mouth every 6 (six) hours as needed for moderate pain.     lisinopril 40 MG tablet  Commonly known as:  PRINIVIL,ZESTRIL  TAKE 1 TABLET (40 MG TOTAL)  BY MOUTH DAILY.  FOR HIGH BLOOD PRESSURE CONTROL.  "NEEDS FOLLOW UP FOR ADDITIONAL REFILLS"     nicotine 21 mg/24hr patch  Commonly known as:  NICODERM CQ - dosed in mg/24 hours   Place 1 patch (21 mg total) onto the skin daily.     potassium chloride 10 MEQ tablet  Commonly known as:  K-DUR,KLOR-CON  Take 1 tablet (10 mEq total) by mouth once.     promethazine 25 MG tablet  Commonly known as:  PHENERGAN  Take 1 tablet (25 mg total) by mouth every 8 (eight) hours  as needed for nausea or vomiting.     QUEtiapine 50 MG tablet  Commonly known as:  SEROQUEL  TAKE 1 TABLET 50 MG TOTAL BY MOUTH AT BEDTIME  NEEDS APPOINTMENT ADDITIONAL REFILLS        Discharge Instructions: Please refer to Patient Instructions section of EMR for full details.  Patient was counseled important signs and symptoms that should prompt return to medical care, changes in medications, dietary instructions, activity restrictions, and follow up appointments.   Follow-Up Appointments: Follow-up Information    Follow up with Abbe Amsterdam, MD.   Specialty:  Family Medicine   Why:  to see what days Dr. Patsy Lager has walk-in clinic so you can see her in 1-2 weeks (there are no appointments available)   Contact information:   [address] [place] [address] [phone]       Joanna Puff, MD 05/12/2014, 10:25 PM PGY-1, [facility]

## 2014-05-07 NOTE — Care Management Note (Unsigned)
    Page 1 of 1   05/07/2014     4:50:58 PM CARE MANAGEMENT NOTE 05/07/2014  Patient:  Brenda Rice,[name]   Account Number:  [id number]  Date Initiated:  05/07/2014  Documentation initiated by:  Amiree No  Subjective/Objective Assessment:   Pt adm on 05/06/14 with hyponatremia.  PTA, pt resides at home with spouse.     Action/Plan:   Will follow for dc needs as pt progresses.   Anticipated DC Date:  05/10/2014   Anticipated DC Plan:  HOME/SELF CARE      DC Planning Services  CM consult      Choice offered to / List presented to:             Status of service:  In process, will continue to follow Medicare Important Message given?   (If response is "NO", the following Medicare IM given date fields will be blank) Date Medicare IM given:   Medicare IM given by:   Date Additional Medicare IM given:   Additional Medicare IM given by:    Discharge Disposition:    Per UR Regulation:  Reviewed for med. necessity/level of care/duration of stay  If discussed at Long Length of Stay Meetings, dates discussed:    Comments:

## 2014-05-07 NOTE — Plan of Care (Signed)
Problem: Phase I Progression Outcomes Goal: Pain controlled with appropriate interventions Outcome: Completed/Met Date Met:  05/07/14 Goal: OOB as tolerated unless otherwise ordered Outcome: Completed/Met Date Met:  05/07/14

## 2014-05-08 DIAGNOSIS — F1020 Alcohol dependence, uncomplicated: Secondary | ICD-10-CM

## 2014-05-08 LAB — BASIC METABOLIC PANEL
Anion gap: 13 (ref 5–15)
Anion gap: 13 (ref 5–15)
Anion gap: 10 (ref 5–15)
Anion gap: 14 (ref 5–15)
BUN: 9 mg/dL (ref 6–23)
BUN: 8 mg/dL (ref 6–23)
BUN: 8 mg/dL (ref 6–23)
BUN: 7 mg/dL (ref 6–23)
CHLORIDE: 85 meq/L — AB (ref 96–112)
CHLORIDE: 84 meq/L — AB (ref 96–112)
CHLORIDE: 92 meq/L — AB (ref 96–112)
CHLORIDE: 87 meq/L — AB (ref 96–112)
CO2: 25 mEq/L (ref 19–32)
CO2: 24 mEq/L (ref 19–32)
CO2: 24 meq/L (ref 19–32)
CO2: 21 meq/L (ref 19–32)
CREATININE: 0.55 mg/dL (ref 0.50–1.10)
CREATININE: 0.67 mg/dL (ref 0.50–1.10)
CREATININE: 0.53 mg/dL (ref 0.50–1.10)
Calcium: 8.9 mg/dL (ref 8.4–10.5)
Calcium: 8.7 mg/dL (ref 8.4–10.5)
Calcium: 8 mg/dL — ABNORMAL LOW (ref 8.4–10.5)
Calcium: 9 mg/dL (ref 8.4–10.5)
Creatinine, Ser: 0.47 mg/dL — ABNORMAL LOW (ref 0.50–1.10)
GFR calc Af Amer: >90 mL/min (ref >90)
GFR calc Af Amer: >90 mL/min (ref >90)
GFR calc non Af Amer: >90 mL/min (ref >90)
GFR calc non Af Amer: >90 mL/min (ref >90)
GFR calc non Af Amer: >90 mL/min (ref >90)
GFR calc non Af Amer: >90 mL/min (ref >90)
Glucose, Bld: 93 mg/dL (ref 70–99)
Glucose, Bld: 90 mg/dL (ref 70–99)
Glucose, Bld: 102 mg/dL — ABNORMAL HIGH (ref 70–99)
Glucose, Bld: 96 mg/dL (ref 70–99)
Potassium: 4.1 mEq/L (ref 3.7–5.3)
Potassium: 4.1 mEq/L (ref 3.7–5.3)
Potassium: 3.3 mEq/L — ABNORMAL LOW (ref 3.7–5.3)
Potassium: 3.8 mEq/L (ref 3.7–5.3)
Sodium: 123 mEq/L — ABNORMAL LOW (ref 137–147)
Sodium: 121 mEq/L — CL (ref 137–147)
Sodium: 126 mEq/L — ABNORMAL LOW (ref 137–147)
Sodium: 122 mEq/L — ABNORMAL LOW (ref 137–147)

## 2014-05-08 MED ORDER — POTASSIUM CHLORIDE CRYS ER 20 MEQ PO TBCR
40.0000 meq | EXTENDED_RELEASE_TABLET | Freq: Once | ORAL | Status: AC
  Administered 2014-05-08: 40 meq via ORAL
  Filled 2014-05-08: qty 2

## 2014-05-08 MED ORDER — SODIUM CHLORIDE 0.9 % IV SOLN
INTRAVENOUS | Status: DC
  Administered 2014-05-08 – 2014-05-10 (×4): via INTRAVENOUS

## 2014-05-08 NOTE — Progress Notes (Signed)
Family Medicine Teaching Service Daily Progress Note Intern Pager: [phone]  Patient name: Brenda CaulDelana L Sand Medical record number: [id number] Date of birth: 07/24/1955 Age: 58 y.o. Gender: female  Primary Care Provider: Abbe AmsterdamOPLAND,JESSICA, MD Consultants: None Code Status: Full  Pt Overview and Major Events to Date:  11/12: Pt presenting with generalized malaise, HA, fatigue, dizziness, and nausea found to a Na of 110  Assessment and Plan: Brenda Rice is a 58 y.o. female presenting with hyponatremia . PMH is significant for hypertension, major depressive disorder, generalized anxiety disorder, alcohol abuse, chronic benzodiazepine use, previous suicide attempt.   #Hyponatremia: Appears euvolemic on exam. FeNa 0.1%.  - Correct very slowly at no more than 9 mEq Na per 24 hours correction.  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  - Fluid restrict to less than 800 mL/day  - Continue to monitor q 6hr. Next BMP at 1pm - strict I's and O's  # Diarrhea- will check for C. Difficile - Recommended visitors wash hands since antibacterial foam will not kill C. difficile  #Hypertension: well controlled   - 125/57 this morning - Hold HCTZ, lisinopril - PRN hydralazine  #COPD: At baseline, no meds - Currently on room air with O2 94% - 80+ pack year Hx of smoking - If other work up unrevealing consider malignancy and low dose CT chest  #Alcohol abuse - States alcohol use is moderate - CIWA score 0 this morning. Down from 5 at 1300 on 11/13 - Continue to monitor  #Epigastric pain/recrrent emesis: Pt currently denies pain. Was supposed to have chest/abdominopelvic imaging as an outpatient but has not done. - Should have upper GI evaluation (barium upper GI vs small bowel follow through vs EGD) to investigate for PUD as well as  biliary imaging.  #Mood disorder - major depressive disorder, generalized anxiety disorder - Re-started Celexa and Seroquel today - ambien if needed for sleep - Continue xanax at home dose  FEN/GI: fluid restrict, regular diet, adding PPI due to GI complaints Prophylaxis: Heparin  Disposition: Pending improvement in Na  Subjective:  No acute complaints overnight. Denies abdominal pain, weakness, dizziness this morning. States she has been having intermittent diarrhea for the past few weeks. States it has no form and she is unable to control it. Is anxious to go home and became tearful when discussing the possibility she may not be able to go home tomorrow. No further complaints today.  Objective: Temp:  [97.9 F (36.6 C)-98.1 F (36.7 C)] 98 F (36.7 C) (11/14 0410) Pulse Rate:  [88-110] 88 (11/14 0410) Resp:  [18] 18 (11/14 0410) BP: (117-148)/(57-78) 125/57 mmHg (11/14 0410) SpO2:  [94 %-98 %] 94 % (11/14 0410)  11/13 0701 - 11/14 0700 In: 1113 [P.O.:1110; I.V.:3] Out: 1900 [Urine:1900]  Physical Exam: Gen: 58yo female resting comfortably in no apparent distress. Tearful when discussing disposition. CV: S1 and S2 noted. No murmurs/rubs/gallops.  Resp: Clear to auscultation bilaterally. No wheezing noted. No increased work of breathing. Abd: Hyperactive BS. Soft, non-distended, No tenderness  Ext: No edema noted Neuro: Alert and oriented, speech clear and coherent. No gross neurologic deficit  Laboratory:  Recent Labs Lab 05/05/14 1436 05/06/14 2127  WBC 10.6* 11.8*  HGB 13.2 13.2  HCT 37.1 35.1*  PLT 255 225    Recent Labs Lab 05/06/14 2127  05/07/14 1640 05/07/14 2249 05/08/14 0450  NA  112*  < > 115* 118* 123*  K 3.4*  < > 3.1* 4.2 4.1  CL 70*  < > 76* 79* 85*  CO2 26  < > 23 24 25   BUN 5*  < > 11 12 9   CREATININE 0.49*  < > 0.56 0.64 0.55  CALCIUM 9.3  < > 9.0 9.0 8.9  PROT 7.5  --   --   --   --   BILITOT 0.6  --   --   --   --   ALKPHOS 102  --    --   --   --   ALT 19  --   --   --   --   AST 29  --   --   --   --   GLUCOSE 93  < > 100* 104* 93  < > = values in this interval not displayed.Serum Osm: 235 Urine osm: 177, K 12, Na <20, creatinine 52.59 INR 1.06 TSH 0.568  HIV: NR UDS +barbituates and benzodiazepines  Ethyl alcohol: 11   Imaging/Diagnostic Tests: No results found.  [address] Dow City[place]um[place], [place] 05/08/2014, 8:28 AM PGY-1, [facility] FPTS Intern pager: [phone], text pages welcome

## 2014-05-08 NOTE — Plan of Care (Signed)
Problem: Phase I Progression Outcomes Goal: Hemodynamically stable Outcome: Progressing     

## 2014-05-08 NOTE — Plan of Care (Signed)
Problem: Phase I Progression Outcomes Goal: Voiding-avoid urinary catheter unless indicated Outcome: Not Applicable Date Met:  75/17/00

## 2014-05-09 LAB — BASIC METABOLIC PANEL
ANION GAP: 15 (ref 5–15)
Anion gap: 14 (ref 5–15)
Anion gap: 14 (ref 5–15)
BUN: 6 mg/dL (ref 6–23)
BUN: 5 mg/dL — AB (ref 6–23)
BUN: 6 mg/dL (ref 6–23)
CHLORIDE: 90 meq/L — AB (ref 96–112)
CHLORIDE: 90 meq/L — AB (ref 96–112)
CHLORIDE: 92 meq/L — AB (ref 96–112)
CO2: 21 mEq/L (ref 19–32)
CO2: 21 mEq/L (ref 19–32)
CO2: 21 mEq/L (ref 19–32)
Calcium: 8.8 mg/dL (ref 8.4–10.5)
Calcium: 8.5 mg/dL (ref 8.4–10.5)
Calcium: 8.9 mg/dL (ref 8.4–10.5)
Creatinine, Ser: 0.45 mg/dL — ABNORMAL LOW (ref 0.50–1.10)
Creatinine, Ser: 0.44 mg/dL — ABNORMAL LOW (ref 0.50–1.10)
Creatinine, Ser: 0.52 mg/dL (ref 0.50–1.10)
GFR calc Af Amer: >90 mL/min (ref >90)
GFR calc Af Amer: >90 mL/min (ref >90)
GFR calc non Af Amer: >90 mL/min (ref >90)
GFR calc non Af Amer: >90 mL/min (ref >90)
GFR calc non Af Amer: >90 mL/min (ref >90)
GLUCOSE: 93 mg/dL (ref 70–99)
Glucose, Bld: 96 mg/dL (ref 70–99)
Glucose, Bld: 97 mg/dL (ref 70–99)
POTASSIUM: 4.1 meq/L (ref 3.7–5.3)
POTASSIUM: 4.4 meq/L (ref 3.7–5.3)
Potassium: 4.7 mEq/L (ref 3.7–5.3)
Sodium: 125 mEq/L — ABNORMAL LOW (ref 137–147)
Sodium: 125 mEq/L — ABNORMAL LOW (ref 137–147)
Sodium: 128 mEq/L — ABNORMAL LOW (ref 137–147)

## 2014-05-09 NOTE — Progress Notes (Signed)
Family Medicine Teaching Service Daily Progress Note Intern Pager: [phone]  Patient name: Brenda Rice Medical record number: [id number] Date of birth: 06/20/1956 Age: 58 y.o. Gender: female  Primary Care Provider: Abbe AmsterdamOPLAND,JESSICA, MD Consultants: None Code Status: Full  Pt Overview and Major Events to Date:  11/12: Pt presenting with generalized malaise, HA, fatigue, dizziness, and nausea found to a Na of 110  Assessment and Plan: Brenda Rice is a 58 y.o. female presenting with hyponatremia . PMH is significant for hypertension, major depressive disorder, generalized anxiety disorder, alcohol abuse, chronic benzodiazepine use, previous suicide attempt.   #Hyponatremia: Appears euvolemic on exam. FeNa 0.1%.  - Correct very slowly at no more than 9 mEq Na per 24 hours correction.  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  - Fluid restrict to less than 800 mL/day  - Patient started on 100cc/hr NS on 11/13 with improvement of Na to 125, however with decrease in fluid rate, her Na continued to decrease therefore it was increased from 50cc/hr to 75cc/hr - Continue to monitor with q 6hr BMETs  - Consider decreasing fluids if Na stable or improving - If no improvement, consider consulting renal  - strict I's and O's  # Diarrhea- will check for C. Difficile, Patient currently denying and angry about this, however order already placed.  - Contact enteric precautions  - F/u Cdiff and BMs  #Hypertension: well controlled   - 138/78 this morning - Hold HCTZ, lisinopril - PRN hydralazine  #COPD: At baseline, no meds - Currently on room air with O2 94% - 80+ pack year Hx of smoking - If other work up unrevealing consider malignancy and low dose CT chest  #Alcohol abuse - States alcohol use is moderate - CIWA score 0 O/N -  Continue to monitor  #Epigastric pain/recrrent emesis: Pt currently denies pain. Was supposed to have chest/abdominopelvic imaging as an outpatient but has not done. - Should have upper GI evaluation (barium upper GI vs small bowel follow through vs EGD) to investigate for PUD as well as biliary imaging.  #Mood disorder - major depressive disorder, generalized anxiety disorder - Continue home Celexa and Seroquel  - ambien if needed for sleep - Continue xanax at home dose  FEN/GI: NS @75cc /hr, regular diet, PPI Prophylaxis: Heparin  Disposition: Pending improvement in Na  Subjective:  Patient frustrated that she's still here. Denies any diarrhea and states yesterday she felt she had a "loose, soft stool" that wasn't necessarily diarrhea. Mad as she feels she's in "quarantine." Denies any N/V or abdominal pain.   Objective: Temp:  [97.9 F (36.6 C)-99.2 F (37.3 C)] 97.9 F (36.6 C) (11/15 0453) Pulse Rate:  [78-94] 78 (11/15 0453) Resp:  [16-18] 16 (11/15 0453) BP: (132-157)/(71-85) 153/77 mmHg (11/15 0453) SpO2:  [97 %-100 %] 100 % (11/15 0453)  11/14 0701 - 11/15 0700 In: 1422 [P.O.:1422] Out: 1000 [Urine:1000]  Physical Exam: Gen: 58yo female frustrated sitting on the side of the bed. CV: RRR. No murmurs/rubs/gallops.  Resp: Clear to auscultation bilaterally. No wheezing, crackles, or rhonchi noted. No increased work of breathing. Abd: Hyperactive BS. Soft, non-distended, No tenderness  Ext: No edema noted Neuro: Alert and oriented, speech clear and coherent. No gross neurologic deficit  Laboratory:  Recent Labs Lab 05/05/14 1436 05/06/14 2127  WBC 10.6* 11.8*  HGB 13.2 13.2  HCT 37.1 35.1*  PLT 255 225    Recent Labs Lab 05/06/14 2127  05/08/14 1625 05/08/14 2257 05/09/14 0322  NA 112*  < > 126* 122* 125*  K 3.4*  < > 3.3* 3.8 4.1  CL 70*  < > 92* 87* 90*  CO2 26  < > 24 21 21   BUN 5*  < > 8 7 6   CREATININE 0.49*  < > 0.53 0.47* 0.45*  CALCIUM 9.3  <  > 8.0* 9.0 8.8  PROT 7.5  --   --   --   --   BILITOT 0.6  --   --   --   --   ALKPHOS 102  --   --   --   --   ALT 19  --   --   --   --   AST 29  --   --   --   --   GLUCOSE 93  < > 102* 96 93  < > = values in this interval not displayed.  Serum Osm: 235 Urine osm: 177, K 12, Na <20, creatinine 52.59 Urine Pro/Cre: 0.09 INR 1.06 TSH 0.568  HIV: NR UDS +barbituates and benzodiazepines  Ethyl alcohol: 11   Imaging/Diagnostic Tests: No results found.  Joanna Puffrystal S Lene Mckay, MD 05/09/2014, 7:14 AM PGY-1, [facility] FPTS Intern pager: [phone], text pages welcome

## 2014-05-09 NOTE — Plan of Care (Signed)
Problem: Phase I Progression Outcomes Goal: Initial discharge plan identified Outcome: Completed/Met Date Met:  05/09/14 Goal: Hemodynamically stable Outcome: Completed/Met Date Met:  05/09/14

## 2014-05-10 ENCOUNTER — Encounter: Payer: Self-pay | Admitting: Family Medicine

## 2014-05-10 ENCOUNTER — Telehealth: Payer: Self-pay | Admitting: Family Medicine

## 2014-05-10 LAB — BASIC METABOLIC PANEL
ANION GAP: 12 (ref 5–15)
ANION GAP: 13 (ref 5–15)
ANION GAP: 13 (ref 5–15)
BUN: 4 mg/dL — ABNORMAL LOW (ref 6–23)
BUN: 4 mg/dL — ABNORMAL LOW (ref 6–23)
BUN: 6 mg/dL (ref 6–23)
CALCIUM: 8.7 mg/dL (ref 8.4–10.5)
CALCIUM: 8.7 mg/dL (ref 8.4–10.5)
CO2: 23 mEq/L (ref 19–32)
CO2: 22 meq/L (ref 19–32)
CO2: 23 mEq/L (ref 19–32)
Calcium: 9.2 mg/dL (ref 8.4–10.5)
Chloride: 93 mEq/L — ABNORMAL LOW (ref 96–112)
Chloride: 94 mEq/L — ABNORMAL LOW (ref 96–112)
Chloride: 93 mEq/L — ABNORMAL LOW (ref 96–112)
Creatinine, Ser: 0.44 mg/dL — ABNORMAL LOW (ref 0.50–1.10)
Creatinine, Ser: 0.52 mg/dL (ref 0.50–1.10)
Creatinine, Ser: 0.52 mg/dL (ref 0.50–1.10)
GFR calc Af Amer: >90 mL/min (ref >90)
GFR calc Af Amer: >90 mL/min (ref >90)
GFR calc Af Amer: >90 mL/min (ref >90)
GFR calc non Af Amer: >90 mL/min (ref >90)
GFR calc non Af Amer: >90 mL/min (ref >90)
GFR calc non Af Amer: >90 mL/min (ref >90)
Glucose, Bld: 98 mg/dL (ref 70–99)
Glucose, Bld: 95 mg/dL (ref 70–99)
Glucose, Bld: 89 mg/dL (ref 70–99)
POTASSIUM: 3.9 meq/L (ref 3.7–5.3)
POTASSIUM: 4.5 meq/L (ref 3.7–5.3)
Potassium: 4.4 mEq/L (ref 3.7–5.3)
SODIUM: 128 meq/L — AB (ref 137–147)
SODIUM: 129 meq/L — AB (ref 137–147)
SODIUM: 129 meq/L — AB (ref 137–147)

## 2014-05-10 LAB — SODIUM, URINE, RANDOM: Sodium, Ur: 53 mEq/L

## 2014-05-10 LAB — OSMOLALITY, URINE: OSMOLALITY UR: 206 mosm/kg — AB (ref 390–1090)

## 2014-05-10 LAB — CREATININE, URINE, RANDOM: Creatinine, Urine: 27.01 mg/dL

## 2014-05-10 LAB — OSMOLALITY: Osmolality: 264 mOsm/kg — ABNORMAL LOW (ref 275–300)

## 2014-05-10 LAB — TSH: TSH: 0.932 u[IU]/mL (ref 0.350–4.500)

## 2014-05-10 LAB — CLOSTRIDIUM DIFFICILE BY PCR: Toxigenic C. Difficile by PCR: NEGATIVE

## 2014-05-10 MED ORDER — HYDRALAZINE HCL 20 MG/ML IJ SOLN: 10.0000 mg | INTRAMUSCULAR | Status: DC | PRN

## 2014-05-10 NOTE — Progress Notes (Signed)
Family Medicine Teaching Service Daily Progress Note Intern Pager: [phone]  Patient name: Brenda Rice Medical record number: [id number] Date of birth: 01/11/1956 Age: 58 y.o. Gender: female  Primary Care Provider: Abbe AmsterdamOPLAND,JESSICA, MD Consultants: None Code Status: Full  Pt Overview and Major Events to Date:  11/12: Pt presenting with generalized malaise, HA, fatigue, dizziness, and nausea found to a Na of 110  Assessment and Plan: Brenda CaulDelana L Siharath is a 58 y.o. female presenting with hyponatremia . PMH is significant for hypertension, major depressive disorder, generalized anxiety disorder, alcohol abuse, chronic benzodiazepine use, previous suicide attempt.   #Hyponatremia: Appears euvolemic on presentation, however fluid restricting to 800cc/day only increased Na from 112 to 115. FeNa 0.1%. 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  It is most likely a combination of them all given the patient's serum osm is low however urine osm is not as low as one would expect for a potomania picture (<100). - Correct very slowly at no more than 9 mEq Na per 24 hours correction given this is chronic in nature - Patient started on 100cc/hr NS on 11/13 with improvement of Na to 125, however with decrease in fluid rate, her Na continued to decrease therefore it was increased from 50cc/hr to 75cc/hr. Overnight, there was another attempt to decrease her fluids to 50cc/hr, however her Na remained stagnant (and for fearing of losing traction as before), she was increased back to 75cc/hr - Consider starting salt tablets vs consulting renal  - Continue to monitor with q 12hr BMETs  - strict I's and O's  #P wave with dropped QRS noted on telemetry: Noted on admission x 2 on 11/13; then occurred again this AM. Symptomatic  - Curb-sided Dr. Swaz[place]  with cardiology who there would be nothing to do unless the pauses became >3seconds or she becomes symptomatic.  # Diarrhea- with new onset in the hospital.  Will check for C. Difficile, Patient currently denying any diarrhea and states she never had frank diarrhea - Contact enteric precautions  - F/u Cdiff and BMs  #Hypertension: Slightly elevated overnight up to 155/81 - Holding HCTZ, lisinopril as these can cloud the picture - PRN hydralazine  #COPD: At baseline, no meds - Currently on room air with O2 98% - 80+ pack year Hx of smoking - If other work up unrevealing consider malignancy and low dose CT chest  #Alcohol abuse: States alcohol use is moderate with 2-3 beers 3-4x per week - CIWA score 0-1 O/N - Continue to monitor  #Epigastric pain/recrrent emesis: Pt currently denies pain. Was supposed to have chest/abdominopelvic imaging as an outpatient but has not done. - Should have upper GI evaluation (barium upper GI vs small bowel follow through vs EGD) to investigate for PUD as well as biliary imaging.  #Mood disorder - major depressive disorder, generalized anxiety disorder - Continue home Celexa and Seroquel  - ambien if needed for sleep - Continue xanax at home dose  FEN/GI: NS @75cc /hr, regular diet, PPI Prophylaxis: Heparin SQ  Disposition: Pending improvement in Na  Subjective:  Patient frustrated that she's still here and that we may never know what her exact cause was. Eager to "try anything" to get out of here. Denies abdominal pain, nausea, or vomiting.   Objective: Temp:  [97.5 F (36.4 C)-98.8 F (37.1 C)] 98.8 F (37.1 C) (11/16 0411) Pulse Rate:  [  87-88] 88 (11/16 0411) Resp:  [16-18] 16 (11/16 0411) BP: (138-155)/(78-93) 147/93 mmHg (11/16 0411) SpO2:  [96 %-100 %] 96 % (11/16 0411)  11/15 0701 - 11/16 0700 In: 600 [P.O.:600] Out: 1400 [Urine:1400]  Physical Exam:  Gen: 58yo female sitting on the side of the bed. CV: RRR. No murmurs/rubs/gallops.   Resp: Clear to auscultation bilaterally. No wheezing, crackles, or rhonchi noted. No increased work of breathing. Abd: +BS. Soft, non-distended, non-tender Ext: No edema noted Neuro: Alert and oriented, speech clear and coherent.  Normal gait.  No gross neurologic deficit  Laboratory:  Recent Labs Lab 05/05/14 1436 05/06/14 2127  WBC 10.6* 11.8*  HGB 13.2 13.2  HCT 37.1 35.1*  PLT 255 225    Recent Labs Lab 05/06/14 2127  05/09/14 1035 05/09/14 1632 05/10/14 0130  NA 112*  < > 125* 128* 128*  K 3.4*  < > 4.7 4.4 3.9  CL 70*  < > 90* 92* 93*  CO2 26  < > 21 21 23   BUN 5*  < > 5* 6 4*  CREATININE 0.49*  < > 0.44* 0.52 0.44*  CALCIUM 9.3  < > 8.5 8.9 8.7  PROT 7.5  --   --   --   --   BILITOT 0.6  --   --   --   --   ALKPHOS 102  --   --   --   --   ALT 19  --   --   --   --   AST 29  --   --   --   --   GLUCOSE 93  < > 96 97 98  < > = values in this interval not displayed.  Serum Osm: 235 Urine osm: 177, K 12, Na <20, creatinine 52.59 Urine Pro/Cre: 0.09 INR 1.06 TSH 0.568  HIV: NR UDS +barbituates and benzodiazepines  Ethyl alcohol: 11   Imaging/Diagnostic Tests: No results found.  Joanna Puffrystal S Shannyn Jankowiak, MD 05/10/2014, 6:44 AM PGY-1, [facility] FPTS Intern pager: [phone], text pages welcome

## 2014-05-10 NOTE — Telephone Encounter (Signed)
Patient left a message on the disabilities VM stating that she needs STD paperwork completed by Dr. Patsy Lageropland. Patient is currently in the hospital (was admitted by Dr. Patsy Lageropland). Please return to disabilities within 5-7 business days.

## 2014-05-10 NOTE — Consult Note (Signed)
Reason for Consult: Hyponatremia Referring Physician: Dossie Arbour MD (Family Medicine teaching service)  HPI:  58 year old Caucasian woman with past medical history significant for generalized anxiety disorder, depression with previous suicidal attempts, hypertension and chronic right hip osteoarthritis who was admitted for hyponatremia preceded by a 4-5 day history of generalized malaise, headache, fatigue, dizziness, nausea and vomiting. Prior to this, she reports trying to drink "a lot of water" because of excessive thirst and also admits to using over-the-counter nonsteroidal anti-inflammatory drugs primarily BC powders for arthralgia/headache. Recently started on Fioricet.  Consumes about 1-2 beers about 3-4 days a week. Denies any personal history of thyroid disorders or adrenal problems. Denies any history of malignancy. Ongoing smoker-2 packs a day for the past 44 years.  Recently started on HCTZ (about 3-4 weeks ago) after noted to have right-sided pedal edema. Has been on Celexa for a longer duration than this.  During this hospitalization, her sodium improved minimally with fluid restriction (from 114 to 118) and improved more impressively to 128 on normal saline-dropping back to 121 when normal saline was discontinued prompting this to be restarted.  Past Medical History  Diagnosis Date  . Hypertension   . Depression   . Suicide attempt     x 3  . Osteoarthritis     right hip  . Fx clavicle     Hx of left clavicle fx  . Hx: UTI (urinary tract infection)     Past Surgical History  Procedure Laterality Date  . Orif left clavicle fracture and pinning 2nd metacarpal  01/03/2010    Family History  Problem Relation Age of Onset  . ADD / ADHD Neg Hx   . Anxiety disorder Neg Hx   . Alcohol abuse Neg Hx   . Bipolar disorder Neg Hx   . Dementia Neg Hx   . Depression Neg Hx   . Drug abuse Neg Hx   . OCD Neg Hx   . Schizophrenia Neg Hx     Social History:  reports that she  has been smoking Cigarettes.  She has a 66 pack-year smoking history. She has never used smokeless tobacco. She reports that she drinks about 0.6 oz of alcohol per week. She reports that she does not use illicit drugs.  Allergies: No Known Allergies  Medications:  Scheduled: . ALPRAZolam  1 mg Oral BID  . amLODipine  5 mg Oral Daily  . citalopram  20 mg Oral Daily  . folic acid  1 mg Oral Daily  . heparin  5,000 Units Subcutaneous 3 times per day  . multivitamin with minerals  1 tablet Oral Daily  . nicotine  21 mg Transdermal Daily  . pantoprazole  20 mg Oral Daily  . thiamine  100 mg Oral Daily    Results for orders placed or performed during the hospital encounter of 05/06/14 (from the past 48 hour(s))  Basic metabolic panel     Status: Abnormal   Collection Time: 05/08/14  4:25 PM  Result Value Ref Range   Sodium 126 (L) 137 - 147 mEq/L   Potassium 3.3 (L) 3.7 - 5.3 mEq/L    Comment: DELTA CHECK NOTED   Chloride 92 (L) 96 - 112 mEq/L   CO2 24 19 - 32 mEq/L   Glucose, Bld 102 (H) 70 - 99 mg/dL   BUN 8 6 - 23 mg/dL   Creatinine, Ser 0.53 0.50 - 1.10 mg/dL   Calcium 8.0 (L) 8.4 - 10.5 mg/dL   GFR calc non  Af Amer >90 >90 mL/min   GFR calc Af Amer >90 >90 mL/min    Comment: (NOTE) The eGFR has been calculated using the CKD EPI equation. This calculation has not been validated in all clinical situations. eGFR's persistently <90 mL/min signify possible Chronic Kidney Disease.    Anion gap 10 5 - 15  Basic metabolic panel     Status: Abnormal   Collection Time: 05/08/14 10:57 PM  Result Value Ref Range   Sodium 122 (L) 137 - 147 mEq/L   Potassium 3.8 3.7 - 5.3 mEq/L   Chloride 87 (L) 96 - 112 mEq/L   CO2 21 19 - 32 mEq/L   Glucose, Bld 96 70 - 99 mg/dL   BUN 7 6 - 23 mg/dL   Creatinine, Ser 0.47 (L) 0.50 - 1.10 mg/dL   Calcium 9.0 8.4 - 10.5 mg/dL   GFR calc non Af Amer >90 >90 mL/min   GFR calc Af Amer >90 >90 mL/min    Comment: (NOTE) The eGFR has been calculated  using the CKD EPI equation. This calculation has not been validated in all clinical situations. eGFR's persistently <90 mL/min signify possible Chronic Kidney Disease.    Anion gap 14 5 - 15  Basic metabolic panel     Status: Abnormal   Collection Time: 05/09/14  3:22 AM  Result Value Ref Range   Sodium 125 (L) 137 - 147 mEq/L   Potassium 4.1 3.7 - 5.3 mEq/L   Chloride 90 (L) 96 - 112 mEq/L   CO2 21 19 - 32 mEq/L   Glucose, Bld 93 70 - 99 mg/dL   BUN 6 6 - 23 mg/dL   Creatinine, Ser 0.45 (L) 0.50 - 1.10 mg/dL   Calcium 8.8 8.4 - 10.5 mg/dL   GFR calc non Af Amer >90 >90 mL/min   GFR calc Af Amer >90 >90 mL/min    Comment: (NOTE) The eGFR has been calculated using the CKD EPI equation. This calculation has not been validated in all clinical situations. eGFR's persistently <90 mL/min signify possible Chronic Kidney Disease.    Anion gap 14 5 - 15  Basic metabolic panel     Status: Abnormal   Collection Time: 05/09/14 10:35 AM  Result Value Ref Range   Sodium 125 (L) 137 - 147 mEq/L   Potassium 4.7 3.7 - 5.3 mEq/L   Chloride 90 (L) 96 - 112 mEq/L   CO2 21 19 - 32 mEq/L   Glucose, Bld 96 70 - 99 mg/dL   BUN 5 (L) 6 - 23 mg/dL   Creatinine, Ser 0.44 (L) 0.50 - 1.10 mg/dL   Calcium 8.5 8.4 - 10.5 mg/dL   GFR calc non Af Amer >90 >90 mL/min   GFR calc Af Amer >90 >90 mL/min    Comment: (NOTE) The eGFR has been calculated using the CKD EPI equation. This calculation has not been validated in all clinical situations. eGFR's persistently <90 mL/min signify possible Chronic Kidney Disease.    Anion gap 14 5 - 15  Basic metabolic panel     Status: Abnormal   Collection Time: 05/09/14  4:32 PM  Result Value Ref Range   Sodium 128 (L) 137 - 147 mEq/L   Potassium 4.4 3.7 - 5.3 mEq/L   Chloride 92 (L) 96 - 112 mEq/L   CO2 21 19 - 32 mEq/L   Glucose, Bld 97 70 - 99 mg/dL   BUN 6 6 - 23 mg/dL   Creatinine, Ser 0.52 0.50 -  1.10 mg/dL   Calcium 8.9 8.4 - 10.5 mg/dL   GFR calc non  Af Amer >90 >90 mL/min   GFR calc Af Amer >90 >90 mL/min    Comment: (NOTE) The eGFR has been calculated using the CKD EPI equation. This calculation has not been validated in all clinical situations. eGFR's persistently <90 mL/min signify possible Chronic Kidney Disease.    Anion gap 15 5 - 15  Basic metabolic panel     Status: Abnormal   Collection Time: 05/10/14  1:30 AM  Result Value Ref Range   Sodium 128 (L) 137 - 147 mEq/L   Potassium 3.9 3.7 - 5.3 mEq/L   Chloride 93 (L) 96 - 112 mEq/L   CO2 23 19 - 32 mEq/L   Glucose, Bld 98 70 - 99 mg/dL   BUN 4 (L) 6 - 23 mg/dL   Creatinine, Ser 0.44 (L) 0.50 - 1.10 mg/dL   Calcium 8.7 8.4 - 10.5 mg/dL   GFR calc non Af Amer >90 >90 mL/min   GFR calc Af Amer >90 >90 mL/min    Comment: (NOTE) The eGFR has been calculated using the CKD EPI equation. This calculation has not been validated in all clinical situations. eGFR's persistently <90 mL/min signify possible Chronic Kidney Disease.    Anion gap 12 5 - 15  Clostridium Difficile by PCR     Status: None   Collection Time: 05/10/14  4:13 AM  Result Value Ref Range   C difficile by pcr NEGATIVE NEGATIVE  Basic metabolic panel     Status: Abnormal   Collection Time: 05/10/14  6:42 AM  Result Value Ref Range   Sodium 129 (L) 137 - 147 mEq/L   Potassium 4.5 3.7 - 5.3 mEq/L   Chloride 94 (L) 96 - 112 mEq/L   CO2 22 19 - 32 mEq/L   Glucose, Bld 95 70 - 99 mg/dL   BUN 4 (L) 6 - 23 mg/dL   Creatinine, Ser 0.52 0.50 - 1.10 mg/dL   Calcium 8.7 8.4 - 10.5 mg/dL   GFR calc non Af Amer >90 >90 mL/min   GFR calc Af Amer >90 >90 mL/min    Comment: (NOTE) The eGFR has been calculated using the CKD EPI equation. This calculation has not been validated in all clinical situations. eGFR's persistently <90 mL/min signify possible Chronic Kidney Disease.    Anion gap 13 5 - 15    No results found.  Review of Systems  Constitutional: Positive for malaise/fatigue. Negative for fever,  chills, weight loss and diaphoresis.  HENT: Negative.   Eyes: Negative.   Respiratory: Negative.   Cardiovascular: Positive for leg swelling.       Right leg swelling about 3-4 weeks ago treated with HCTZ  Gastrointestinal: Positive for nausea, vomiting and abdominal pain. Negative for heartburn, diarrhea and constipation.  Genitourinary: Negative.   Musculoskeletal: Positive for joint pain.       Right hip pain- chronic for >4 years  Skin: Negative.   Neurological: Positive for dizziness and weakness. Negative for tingling, tremors, sensory change and focal weakness.       Dizziness prior to admission  Endo/Heme/Allergies: Negative.   Psychiatric/Behavioral: The patient is nervous/anxious.    Blood pressure 141/82, pulse 78, temperature 98.3 F (36.8 C), temperature source Oral, resp. rate 18, height 5' 2"  (1.575 m), weight 71 kg (156 lb 8.4 oz), SpO2 98 %. Physical Exam  Nursing note and vitals reviewed. Constitutional: She is oriented to person, place, and  time. She appears well-developed and well-nourished. No distress.  HENT:  Head: Normocephalic and atraumatic.  Nose: Nose normal.  Mouth/Throat: No oropharyngeal exudate.  Eyes: Conjunctivae and EOM are normal. Pupils are equal, round, and reactive to light. No scleral icterus.  Neck: Normal range of motion. Neck supple. No JVD present. No thyromegaly present.  Cardiovascular: Normal rate, regular rhythm and normal heart sounds.  Exam reveals no friction rub.   No murmur heard. Respiratory: Effort normal and breath sounds normal. No respiratory distress. She has no wheezes. She has no rales.  GI: Soft. Bowel sounds are normal. She exhibits no distension. There is no tenderness. There is no rebound.  Musculoskeletal: Normal range of motion. She exhibits no edema.  Neurological: She is alert and oriented to person, place, and time.  Skin: Skin is warm and dry. No erythema.  Psychiatric:  Anxious and jittery     Assessment/Plan: 1. Hyponatremia: The patient appears to be clinically euvolemic. Her serum osmolality is low indicating that this is a true hyponatremia and her urinary osmolality is appropriately low (although still greater than 100 which might be seen in SIADH). Urine sodium is very low at less than 20. Given these constellation of findings, will evaluate for thyroid and adrenal insufficiency by checking TSH and cortisol. Urine and serum osmolalities will be repeated. Continue normal saline at this time with fluid restriction and further management will be undertaken as more data becomes available. Her improvement of serum sodium on normal saline and low urine sodium may be indeed a reflection of an intravascular volume depletion in contra distinction to what appears to be a euvolemic patient clinically. We'll transiently increase the rate of normal saline drip. Would avoid hydrochlorothiazide completely in this patient and consider therapies alternative to Celexa. Her low albumin level may also indicate poor solute intake as a contributor to hyponatremia. 2. Hypertension: Blood pressures appear to be fairly controlled, ACE inhibitor on hold and thiazide discontinued. If has recurrent edema, may need loop diuretic in low dose such as furosemide 10-20 mg daily. 3. Depression/anxiety: Will need to discuss closely with her behavioral health provider regarding substitution of Celexa.  Jaidyn Usery K. 05/10/2014, 2:20 PM

## 2014-05-10 NOTE — Plan of Care (Signed)
Problem: Phase II Progression Outcomes Goal: Progress activity as tolerated unless otherwise ordered Outcome: Completed/Met Date Met:  05/10/14     

## 2014-05-11 LAB — BASIC METABOLIC PANEL
Anion gap: 13 (ref 5–15)
Anion gap: 14 (ref 5–15)
BUN: 4 mg/dL — AB (ref 6–23)
BUN: 6 mg/dL (ref 6–23)
CALCIUM: 9.6 mg/dL (ref 8.4–10.5)
CHLORIDE: 94 meq/L — AB (ref 96–112)
CO2: 22 mEq/L (ref 19–32)
CO2: 22 mEq/L (ref 19–32)
Calcium: 9 mg/dL (ref 8.4–10.5)
Chloride: 89 mEq/L — ABNORMAL LOW (ref 96–112)
Creatinine, Ser: 0.42 mg/dL — ABNORMAL LOW (ref 0.50–1.10)
Creatinine, Ser: 0.5 mg/dL (ref 0.50–1.10)
GFR calc Af Amer: >90 mL/min (ref >90)
GFR calc Af Amer: >90 mL/min (ref >90)
GFR calc non Af Amer: >90 mL/min (ref >90)
GFR calc non Af Amer: >90 mL/min (ref >90)
GLUCOSE: 92 mg/dL (ref 70–99)
GLUCOSE: 92 mg/dL (ref 70–99)
POTASSIUM: 4.1 meq/L (ref 3.7–5.3)
Potassium: 3.8 mEq/L (ref 3.7–5.3)
Sodium: 129 mEq/L — ABNORMAL LOW (ref 137–147)
Sodium: 125 mEq/L — ABNORMAL LOW (ref 137–147)

## 2014-05-11 LAB — CBC
HEMATOCRIT: 32 % — AB (ref 36.0–46.0)
Hemoglobin: 10.9 g/dL — ABNORMAL LOW (ref 12.0–15.0)
MCH: 32 pg (ref 26.0–34.0)
MCHC: 34.1 g/dL (ref 30.0–36.0)
MCV: 93.8 fL (ref 78.0–100.0)
Platelets: 215 10*3/uL (ref 150–400)
RBC: 3.41 MIL/uL — ABNORMAL LOW (ref 3.87–5.11)
RDW: 12.3 % (ref 11.5–15.5)
WBC: 7.4 10*3/uL (ref 4.0–10.5)

## 2014-05-11 LAB — CORTISOL: Cortisol, Plasma: 14.6 ug/dL

## 2014-05-11 MED ORDER — FUROSEMIDE 20 MG PO TABS
10.0000 mg | ORAL_TABLET | Freq: Every day | ORAL | Status: DC
  Administered 2014-05-11 – 2014-05-12 (×2): 10 mg via ORAL
  Filled 2014-05-11 (×2): qty 0.5

## 2014-05-11 MED ORDER — CITALOPRAM HYDROBROMIDE 10 MG PO TABS
10.0000 mg | ORAL_TABLET | Freq: Every day | ORAL | Status: DC
  Administered 2014-05-11 – 2014-05-12 (×2): 10 mg via ORAL
  Filled 2014-05-11 (×3): qty 1

## 2014-05-11 NOTE — Progress Notes (Signed)
Patient ID: Brenda Rice, female   DOB: 10/28/1955, 58 y.o.   MRN: [id number]  [facility] Progress Note    Assessment/ Plan:   1. Hyponatremia: true euvolemic hyponatremia. No evidence of hypothyroidism/adrenal insufficiency. Initial labs suggestive of possible intravascular volume contraction with appropriate renal response-follow-up labs with hyponatremia more suggestive of SIADH (although she was on normal saline so difficult to clearly interpret). I will discontinue her normal saline drip today and start 10 mg furosemide daily-monitor sodium trend in this to decide on further disposition/management. I do not think that this is cerebral salt wasting and would avoid salt tablets (as this risks her to drink more water). Will need to continue fluid restriction less than 1000 mL a day. 2. Hypertension: Blood pressures appear to be fairly controlled, ACE inhibitor on hold and thiazide discontinued. Start furosemide 10 mg daily. Avoid thiazides. 3. Depression/anxiety: Will need to discuss closely with her behavioral health provider regarding substitution of Celexa.  Subjective:   Remains anxious about her hyponatremia/disposition.   Objective:   BP 160/83 mmHg  Pulse 90  Temp(Src) 98.1 F (36.7 C) (Oral)  Resp 18  Ht 5\' 2"  (1.575 m)  Wt 71 kg (156 lb 8.4 oz)  BMI 28.62 kg/m2  SpO2 98%  Intake/Output Summary (Last 24 hours) at 05/11/14 0903 Last data filed at 05/11/14 0730  Gross per 24 hour  Intake    660 ml  Output   1150 ml  Net   -490 ml   Weight change:   Physical Exam: WUJ:[id number]:comfortably resting on the side of her bed AOZ:HYQMVCVS:pulse regular in rate and rhythm, S1 and S2 normal Resp:clear to auscultation bilaterally, no rales/rhonchi Abd: soft, flat, nontender HQI:ONGEXExt:trace ankle edema right>left  Imaging: No results found.  Labs: BMET  Recent Labs Lab 05/09/14 0322 05/09/14 1035 05/09/14 1632 05/10/14 0130 05/10/14 0642 05/10/14 1551 05/11/14 0459  NA 125*  125* 128* 128* 129* 129* 129*  K 4.1 4.7 4.4 3.9 4.5 4.4 4.1  CL 90* 90* 92* 93* 94* 93* 94*  CO2 21 21 21 23 22 23 22   GLUCOSE 93 96 97 98 95 89 92  BUN 6 5* 6 4* 4* 6 4*  CREATININE 0.45* 0.44* 0.52 0.44* 0.52 0.52 0.42*  CALCIUM 8.8 8.5 8.9 8.7 8.7 9.2 9.0   CBC  Recent Labs Lab 05/05/14 1436 05/06/14 2127 05/11/14 0459  WBC 10.6* 11.8* 7.4  HGB 13.2 13.2 10.9*  HCT 37.1 35.1* 32.0*  MCV 91.8 90.5 93.8  PLT 255 225 215    Medications:    . ALPRAZolam  1 mg Oral BID  . amLODipine  5 mg Oral Daily  . folic acid  1 mg Oral Daily  . heparin  5,000 Units Subcutaneous 3 times per day  . multivitamin with minerals  1 tablet Oral Daily  . nicotine  21 mg Transdermal Daily  . pantoprazole  20 mg Oral Daily  . thiamine  100 mg Oral Daily   Zetta BillsJay Jo-Anne Kluth, MD 05/11/2014, 9:03 AM

## 2014-05-11 NOTE — Telephone Encounter (Signed)
Patient called enquiring about FMLA ppw, states that Dr. Patsy Lageropland personally promised her this would be complete by today, Please advise pt of status.

## 2014-05-11 NOTE — Plan of Care (Signed)
Problem: Phase III Progression Outcomes Goal: Pain controlled on oral analgesia Outcome: Completed/Met Date Met:  05/11/14 Goal: Activity at appropriate level-compared to baseline (UP IN CHAIR FOR HEMODIALYSIS)  Outcome: Completed/Met Date Met:  05/11/14

## 2014-05-11 NOTE — Plan of Care (Signed)
Problem: Phase II Progression Outcomes Goal: Vital signs remain stable Outcome: Completed/Met Date Met:  05/11/14

## 2014-05-11 NOTE — Telephone Encounter (Signed)
Called her- did her paperwork for her and will fax in.  She is seeing nephrology now and she feels good that they are making progress

## 2014-05-11 NOTE — Progress Notes (Signed)
Family Medicine Teaching Service Daily Progress Note Intern Pager: [phone]  Patient name: Brenda Rice Medical record number: [id number] Date of birth: 07/27/1955 Age: 58 y.o. Gender: female  Primary Care Provider: Abbe AmsterdamOPLAND,JESSICA, MD Consultants: None Code Status: Full  Pt Overview and Major Events to Date:  11/12: Pt presenting with generalized malaise, HA, fatigue, dizziness, and nausea found to a Na of 110 11/16: Nephrology consulted  Assessment and Plan: Brenda Rice is a 58 y.o. female presenting with hyponatremia . PMH is significant for hypertension, major depressive disorder, generalized anxiety disorder, alcohol abuse, chronic benzodiazepine use, previous suicide attempt.   #Hyponatremia: Appears euvolemic on presentation, however fluid restricting to 800cc/day only increased Na from 112 to 115. FeNa 0.1%. 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  It is most likely a combination of them all given the patient's serum osm is low however urine osm is not as low as one would expect for a potomania picture (<100). - Per nephrology: her serum osm is low indicating a true hyponatremia and her urine osm is appropriately low (although she is still greater than 100 which might be seen in SIADH). Evaluate for thyroid and adrenal insufficiency; however both within normal limits. - Patient started on 100cc/hr NS on 11/13 with improvement of Na to 125, however with decrease in fluid rate, her Na continued to decrease therefore it was increased from 50cc/hr to 75cc/hr. Overnight, there was another attempt to decrease her fluids to 50cc/hr, however her Na remained stagnant (and for fearing of losing traction as before), she was increased back to 75cc/hr. NS increased to 100cc/hr on 11/16 per nephro. - Today, will discontinue  fluids and start Lasix 10mg  PO with continued fluid restriction to 107000mL/day. - Nephrology following, appreciate recs - Continue to monitor with q 12hr BMETs  - strict I's and O's  #P wave with dropped QRS noted on telemetry: Noted on admission x 2 on 11/13; then occurred again on 11/16. Asymptomatic.  - Curb-sided Dr. SwazilandJordan with cardiology who there would be nothing to do unless the pauses became >3seconds or she becomes symptomatic.  # Diarrhea- with new onset in the hospital.  Will check for C. Difficile, Patient currently denying any diarrhea and states she never had frank diarrhea - Cdiff negative  - Discontinue contact enteric precautions   #Hypertension: Elevated overnight up to 160/83 - Discontinuing HCTZ (should not be discharged home with this) - Holding lisinopril as these can cloud the picture - PRN hydralazine  #COPD: At baseline, no meds - Currently on room air with O2 98% - 80+ pack year Hx of smoking - If other work up unrevealing consider malignancy and low dose CT chest  #Alcohol abuse: States alcohol use is moderate with 2-3 beers 3-4x per week - CIWA score 0-3 (for headache/fullness) O/N - Continue to monitor  #Epigastric pain/recrrent emesis: Pt currently denies pain. Was supposed to have chest/abdominopelvic imaging as an outpatient but has not done. - Should have upper GI evaluation (barium upper GI vs small bowel follow through vs EGD) to investigate for PUD as well as biliary imaging.  #Mood disorder - major depressive disorder, generalized anxiety disorder - Continue Seroquel  - Hold Celexa for now; it takes 24-48hrs to be clear the system, if hyponatremia readily resolves this could be the main culprit. Remus Loffler- ambien if needed for sleep - Continue  xanax at home dose  FEN/GI: NS @100cc /hr, regular diet, Fluid restriction,  PPI Prophylaxis: Heparin SQ  Disposition: Pending improvement in Na  Subjective:  Patient doing well. No N/V or abdominal pain.   Eager to have her fluids stopped today. No diarrhea.  Objective: Temp:  [97.6 F (36.4 C)-98.3 F (36.8 C)] 98.1 F (36.7 C) (11/17 0344) Pulse Rate:  [78-96] 90 (11/17 0344) Resp:  [18-20] 18 (11/17 0344) BP: (141-178)/(75-85) 160/83 mmHg (11/17 0619) SpO2:  [98 %] 98 % (11/17 0344)  11/16 0701 - 11/17 0700 In: 780 [P.O.:780] Out: 1600 [Urine:1600]   Physical Exam:  Gen: 58yo female sitting on the side of the bed, more pleasant today than most. CV: RRR. No murmurs/rubs/gallops.  Resp: Clear to auscultation bilaterally. No wheezing, crackles, or rhonchi noted. No increased work of breathing. Abd: +BS. Soft, non-distended, non-tender Ext: 1+ pitting edema bilaterally Neuro: Alert and oriented, speech clear and coherent.  Normal gait.  No gross neurologic deficit  Laboratory:  Recent Labs Lab 05/05/14 1436 05/06/14 2127 05/11/14 0459  WBC 10.6* 11.8* 7.4  HGB 13.2 13.2 10.9*  HCT 37.1 35.1* 32.0*  PLT 255 225 215    Recent Labs Lab 05/06/14 2127  05/10/14 0642 05/10/14 1551 05/11/14 0459  NA 112*  < > 129* 129* 129*  K 3.4*  < > 4.5 4.4 4.1  CL 70*  < > 94* 93* 94*  CO2 26  < > 22 23 22   BUN 5*  < > 4* 6 4*  CREATININE 0.49*  < > 0.52 0.52 0.42*  CALCIUM 9.3  < > 8.7 9.2 9.0  PROT 7.5  --   --   --   --   BILITOT 0.6  --   --   --   --   ALKPHOS 102  --   --   --   --   ALT 19  --   --   --   --   AST 29  --   --   --   --   GLUCOSE 93  < > 95 89 92  < > = values in this interval not displayed.  Serum Osm 11/12: 235 Urine 11/12:  osm 177, K 12, Na <20, creatinine 52.59 Urine 11/16: osm 206, Na 53, creatinine 27.01 Serum Osm 11/16: 264  TSH:  0.932 Cortisol: 14.6 Urine Pro/Cre: 0.09 INR 1.06 TSH 0.568  HIV: NR UDS +barbituates and benzodiazepines  Ethyl alcohol: 11  C diff negative   Joanna Puffrystal S Laniyah Rosenwald, MD 05/11/2014, 7:13 AM PGY-1, [facility] FPTS Intern pager: [phone], text pages welcome

## 2014-05-12 DIAGNOSIS — F320 Major depressive disorder, single episode, mild: Secondary | ICD-10-CM

## 2014-05-12 LAB — BASIC METABOLIC PANEL
Anion gap: 11 (ref 5–15)
BUN: 5 mg/dL — ABNORMAL LOW (ref 6–23)
CALCIUM: 9.1 mg/dL (ref 8.4–10.5)
CO2: 24 mEq/L (ref 19–32)
Chloride: 93 mEq/L — ABNORMAL LOW (ref 96–112)
Creatinine, Ser: 0.47 mg/dL — ABNORMAL LOW (ref 0.50–1.10)
GFR calc Af Amer: >90 mL/min (ref >90)
GFR calc non Af Amer: >90 mL/min (ref >90)
GLUCOSE: 93 mg/dL (ref 70–99)
Potassium: 3.6 mEq/L — ABNORMAL LOW (ref 3.7–5.3)
Sodium: 128 mEq/L — ABNORMAL LOW (ref 137–147)

## 2014-05-12 MED ORDER — POTASSIUM CHLORIDE CRYS ER 10 MEQ PO TBCR: 10.0000 meq | EXTENDED_RELEASE_TABLET | Freq: Once | ORAL | Status: DC

## 2014-05-12 MED ORDER — CITALOPRAM HYDROBROMIDE 10 MG PO TABS: ORAL_TABLET | ORAL | Status: DC

## 2014-05-12 MED ORDER — LISINOPRIL 40 MG PO TABS
40.0000 mg | ORAL_TABLET | Freq: Every day | ORAL | Status: DC
  Administered 2014-05-12: 40 mg via ORAL
  Filled 2014-05-12: qty 1

## 2014-05-12 MED ORDER — FUROSEMIDE 20 MG PO TABS: 10.0000 mg | ORAL_TABLET | Freq: Every day | ORAL | Status: DC

## 2014-05-12 MED ORDER — NICOTINE 21 MG/24HR TD PT24: 21.0000 mg | MEDICATED_PATCH | Freq: Every day | TRANSDERMAL | Status: DC

## 2014-05-12 MED ORDER — POTASSIUM CHLORIDE CRYS ER 20 MEQ PO TBCR
40.0000 meq | EXTENDED_RELEASE_TABLET | Freq: Two times a day (BID) | ORAL | Status: DC
  Administered 2014-05-12: 40 meq via ORAL
  Filled 2014-05-12: qty 2

## 2014-05-12 NOTE — Progress Notes (Addendum)
Patient ID: Brenda Rice, female   DOB: 07/09/1955, 58 y.o.   MRN: [id number]  [facility] Progress Note    Assessment/ Plan:   1. Hyponatremia: true euvolemic hyponatremia. No evidence of hypothyroidism/adrenal insufficiency. 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 She appears to be safe enough to discharge home today on furosemide 10 mg daily (long-term therapy)and K Dur 10 mEq daily (for the next 10 days). She will need weekly labs starting this Friday at her primary care provider's office to follow her sodium/potassium trend. 2. Hypertension: Blood pressures appear to be elevated-on monotherapy currently with furosemide 10 mg daily, restart lisinopril 40 mg daily as there is no evidence of volume depletion.  3. Depression/anxiety: Will need to discuss closely with her behavioral health provider regarding substitution of Celexa if hyponatremia persists.  She appears to be stable for discharge-does not require a scheduled renal follow-up and can be referred if problems persist with hyponatremia management.  Subjective:   Reports to be feeling fair-still very anxious/tearful surrounding her hospitalization and states that she wants to leave the hospital today.   Objective:   BP 160/92 mmHg  Pulse 90  Temp(Src) 98.3 F (36.8 C) (Oral)  Resp 18  Ht 5\' 2"  (1.575 m)  Wt 71 kg (156 lb 8.4 oz)  BMI 28.62 kg/m2  SpO2 100%  Intake/Output Summary (Last 24 hours) at 05/12/14 0910 Last data filed at 05/12/14 0547  Gross per 24 hour  Intake    810 ml  Output    651 ml  Net    159 ml   Weight change:   Physical Exam: VHQ:IONGEXBMWUXGen:comfortably ambulating around her room-normal gait, intermittently tearful while speaking LKG:MWNUUCVS:pulse regular in rate and rhythm, S1 and S2 normal Resp:clear to auscultation bilaterally, no rales/rhonchi VOZ:DGUYAbd:soft, flat, nontender Ext:no lower extremity edema  appreciated  Imaging: No results found.  Labs: BMET  Recent Labs Lab 05/09/14 1632 05/10/14 0130 05/10/14 0642 05/10/14 1551 05/11/14 0459 05/11/14 1727 05/12/14 0430  NA 128* 128* 129* 129* 129* 125* 128*  K 4.4 3.9 4.5 4.4 4.1 3.8 3.6*  CL 92* 93* 94* 93* 94* 89* 93*  CO2 21 23 22 23 22 22 24   GLUCOSE 97 98 95 89 92 92 93  BUN 6 4* 4* 6 4* 6 5*  CREATININE 0.52 0.44* 0.52 0.52 0.42* 0.50 0.47*  CALCIUM 8.9 8.7 8.7 9.2 9.0 9.6 9.1   CBC  Recent Labs Lab 05/05/14 1436 05/06/14 2127 05/11/14 0459  WBC 10.6* 11.8* 7.4  HGB 13.2 13.2 10.9*  HCT 37.1 35.1* 32.0*  MCV 91.8 90.5 93.8  PLT 255 225 215   Medications:    . ALPRAZolam  1 mg Oral BID  . amLODipine  5 mg Oral Daily  . citalopram  10 mg Oral Daily  . folic acid  1 mg Oral Daily  . furosemide  10 mg Oral Daily  . heparin  5,000 Units Subcutaneous 3 times per day  . multivitamin with minerals  1 tablet Oral Daily  . nicotine  21 mg Transdermal Daily  . pantoprazole  20 mg Oral Daily  . potassium chloride  40 mEq Oral BID  . thiamine  100 mg Oral Daily   Zetta BillsJay Felicity Penix, MD 05/12/2014, 9:10 AM

## 2014-05-12 NOTE — Discharge Instructions (Signed)
I'm glad you are feeling better!  The kidney doctors recommend discharging you home today with Furosemide and Potassium for the next ten days.  You will need to call your family medicine doctor and arrange to have labs drawn weekly to check on your sodium and potassium; your first labs should be checked on Friday 11/20/.  We will slowly taper you off of Celexa as this may be contributing to your sodium being low. You may take 10mg  (1 tablet) for the next week, 10mg  every other day for one week, and then stop taking Celexa.  You may discuss the need of being started on another medication with your family medicine doctor.  Hyponatremia  Hyponatremia is when the amount of salt (sodium) in your blood is too low. When sodium levels are low, your cells will absorb extra water and swell. The swelling happens throughout the body, but it mostly affects the brain. Severe brain swelling (cerebral edema), seizures, or coma can happen.  CAUSES   Heart, kidney, or liver problems.  Thyroid problems.  Adrenal gland problems.  Severe vomiting and diarrhea.  Certain medicines or illegal drugs.  Dehydration.  Drinking too much water.  Low-sodium diet. SYMPTOMS   Nausea and vomiting.  Confusion.  Lethargy.  Agitation.  Headache.  Twitching or shaking (seizures).  Unconsciousness.  Appetite loss.  Muscle weakness and cramping. DIAGNOSIS  Hyponatremia is identified by a simple blood test. Your caregiver will perform a history and physical exam to try to find the cause and type of hyponatremia. Other tests may be needed to measure the amount of sodium in your blood and urine. TREATMENT  Treatment will depend on the cause.   Fluids may be given through the vein (IV).  Medicines may be used to correct the sodium imbalance. If medicines are causing the problem, they will need to be adjusted.  Water or fluid intake may be restricted to restore proper balance. The speed of correcting the sodium  problem is very important. If the problem is corrected too fast, nerve damage (sometimes unchangeable) can happen. HOME CARE INSTRUCTIONS   Only take medicines as directed by your caregiver. Many medicines can make hyponatremia worse. Discuss all your medicines with your caregiver.  Carefully follow any recommended diet, including any fluid restrictions.  You may be asked to repeat lab tests. Follow these directions.  Avoid alcohol and recreational drugs. SEEK MEDICAL CARE IF:   You develop worsening nausea, fatigue, headache, confusion, or weakness.  Your original hyponatremia symptoms return.  You have problems following the recommended diet. SEEK IMMEDIATE MEDICAL CARE IF:   You have a seizure.  You faint.  You have ongoing diarrhea or vomiting. MAKE SURE YOU:   Understand these instructions.  Will watch your condition.  Will get help right away if you are not doing well or get worse. Document Released: 06/01/2002 Document Revised: 09/03/2011 Document Reviewed: 11/26/2010 [facility]ExitCare Patient Information 2015 [employer] [place] [place]. This information is not intended to replace advice given to you by your health care provider. Make sure you discuss any questions you have with your health care provider.

## 2014-05-12 NOTE — Progress Notes (Signed)
Family Medicine Teaching Service Daily Progress Note Intern Pager: [phone]  Patient name: Brenda Rice Medical record number: [id number] Date of birth: 06/24/1956 Age: 58 y.o. Gender: female  Primary Care Provider: Abbe AmsterdamOPLAND,JESSICA, MD Consultants: None Code Status: Full  Pt Overview and Major Events to Date:  11/12: Pt presenting with generalized malaise, HA, fatigue, dizziness, and nausea found to a Na of 110 11/16: Nephrology consulted  Assessment and Plan: Brenda Rice is a 58 y.o. female presenting with hyponatremia . PMH is significant for hypertension, major depressive disorder, generalized anxiety disorder, alcohol abuse, chronic benzodiazepine use, previous suicide attempt.   #Hyponatremia:  Sodium <110 at admission - Sodium 128 today. 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  - Discharge today on Furosemide 10mg  daily and K Dur 10mEq daily for next ten days  - Weekly labs starting this Friday at PCP to follow Na and K - Continue to monitor with q 12hr BMETs  - strict I's and O's  # Hypokalemia- 3.6 this morning  -  Potassium chloride 40mEq x2  #Hypertension: 160/92 this morning - Discontinued HCTZ (should not be discharged home with this) - Holding lisinopril as these can cloud the picture - Amlodipine 5mg , Furosemide 10mg , PRN hydralazine - Restart Lisinopril 40mg   #Alcohol abuse: States alcohol use is moderate with 2-3 beers 3-4x per week - CIWA score 0 overnight - Continue to monitor  #Epigastric pain/recrrent emesis: Pt currently denies pain. Was supposed to have chest/abdominopelvic imaging as an outpatient but has not done. - Should have upper GI evaluation (barium upper  GI vs small bowel follow through vs EGD) to investigate for PUD as well as biliary imaging.  #Mood disorder - major depressive disorder, generalized anxiety disorder - Continue Seroquel  - Hold Celexa for now; it takes 24-48hrs to be clear the system, if hyponatremia readily resolves this could be the main culprit. Remus Loffler- ambien if needed for sleep - Continue xanax at home dose  FEN/GI: NS @100cc /hr, regular diet, Fluid restriction,  PPI Prophylaxis: Heparin SQ  Disposition: Pending improvement in Na  Subjective:  No acute complaints overnight and no complaints today. States she is ready to go home and excited about discharge today.   Objective: Temp:  [97.6 F (36.4 C)-99 F (37.2 C)] 98.3 F (36.8 C) (11/18 0358) Pulse Rate:  [80-94] 90 (11/18 0358) Resp:  [18] 18 (11/18 0358) BP: (142-160)/(69-92) 160/92 mmHg (11/18 0358) SpO2:  [97 %-100 %] 100 % (11/18 0358)  11/17 0701 - 11/18 0700 In: 1170 [P.O.:1170] Out: 1101 [Urine:1100; Stool:1]   Physical Exam:  Gen: 58yo female standing in room in no apparent distress CV: S1 and S2 noted. Regular rate and rhythm. No murmurs/rubs/gallops.  Resp: Clear to auscultation bilaterally. No wheezing, crackles, or rhonchi noted. No increased work of breathing. Abd: +BS. Soft, non-distended, non-tender Ext: 1+ pitting edema bilaterally Neuro: Alert and oriented, speech clear and coherent.  Normal gait.  No gross neurologic deficit  Laboratory:  Recent Labs Lab 05/05/14 1436 05/06/14 2127 05/11/14 0459  WBC 10.6* 11.8* 7.4  HGB 13.2 13.2 10.9*  HCT 37.1 35.1* 32.0*  PLT 255 225 215    Recent Labs Lab 05/06/14 2127  05/11/14 0459 05/11/14 1727 05/12/14 0430  NA  112*  < > 129* 125* 128*  K 3.4*  < > 4.1 3.8 3.6*  CL 70*  < > 94* 89* 93*  CO2 26  < > 22 22 24   BUN 5*  < > 4* 6 5*  CREATININE 0.49*  < > 0.42* 0.50 0.47*  CALCIUM 9.3  < > 9.0 9.6 9.1  PROT 7.5  --   --   --   --   BILITOT 0.6  --   --   --   --   ALKPHOS 102   --   --   --   --   ALT 19  --   --   --   --   AST 29  --   --   --   --   GLUCOSE 93  < > 92 92 93  < > = values in this interval not displayed.  Serum Osm 11/12: 235 Urine 11/12:  osm 177, K 12, Na <20, creatinine 52.59 Urine 11/16: osm 206, Na 53, creatinine 27.01 Serum Osm 11/16: 264  TSH:  0.932 Cortisol: 14.6 Urine Pro/Cre: 0.09 INR 1.06 TSH 0.568  HIV: NR UDS +barbituates and benzodiazepines  Ethyl alcohol: 11  C diff negative   Araceli Bouchealeigh N Rumley, DO 05/12/2014, 7:20 AM PGY-1, [facility] FPTS Intern pager: [phone], text pages welcome

## 2014-05-14 ENCOUNTER — Encounter: Payer: Self-pay | Admitting: Family Medicine

## 2014-05-14 ENCOUNTER — Ambulatory Visit (INDEPENDENT_AMBULATORY_CARE_PROVIDER_SITE_OTHER): Payer: Managed Care, Other (non HMO) | Admitting: Family Medicine

## 2014-05-14 VITALS — BP 120/74 | HR 86 | Temp 99.0°F | Resp 16 | Ht 62.0 in | Wt 155.2 lb

## 2014-05-14 DIAGNOSIS — I10 Essential (primary) hypertension: Secondary | ICD-10-CM

## 2014-05-14 DIAGNOSIS — F418 Other specified anxiety disorders: Secondary | ICD-10-CM

## 2014-05-14 DIAGNOSIS — F329 Major depressive disorder, single episode, unspecified: Secondary | ICD-10-CM

## 2014-05-14 DIAGNOSIS — E871 Hypo-osmolality and hyponatremia: Secondary | ICD-10-CM

## 2014-05-14 DIAGNOSIS — F419 Anxiety disorder, unspecified: Secondary | ICD-10-CM

## 2014-05-14 NOTE — Progress Notes (Signed)
[facility] [address], [place] [id number] [phone]  Date:  05/14/2014   Name:  Brenda CaulDelana L Holloway   DOB:  11/22/1955   MRN:  [id number]  PCP:  Abbe AmsterdamOPLAND,JESSICA, MD    Chief Complaint: Follow-up; hospital follow up; and Labs Only   History of Present Illness:  Brenda Rice is a 58 y.o. very pleasant female patient who presents with the following:  She is here today for a hospital follow-up. She was inpt from 11/12 to 11/18 with hyponatremia, thought to be due to medications- ?SIADH She needs to have weekly BMP She saw nephrology (Dr. Allena KatzPatel) while in the hospital. She was given lasix. They think that Celea may have been the cause- she is on a celexa taper.  She is able to use her xanax.  She has been ok off the celexa.   She had been on celexa for 5-6 years wihtout any trouble in the past.  They also stopped her seroquel.   They do have her on lisinopil, and 10mg  of lasix daily.   She was told she does not need to see nephrology.   She is taking 10meq of K daily.  She is holding her norvasc right now  She has one refill of her xanax remaining  Patient Active Problem List   Diagnosis Date Noted  . MDD (major depressive disorder) 10/06/2013  . Alcohol dependence 05/26/2013  . GAD (generalized anxiety disorder) 05/26/2013  . Depressive disorder 05/26/2013  . Benzodiazepine dependence 05/25/2013  . Alcohol dependence syndrome 02/15/2013  . Suicide attempt by multiple drug overdose 02/15/2013  . Substance induced mood disorder 02/15/2013  . Nicotine dependence 02/15/2013  . COPD (chronic obstructive pulmonary disease) 02/15/2013  . Hyponatremia 07/05/2012  . Nicotine addiction 09/02/2011  . Hypertension 07/12/2011  . 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 Hypertension   . Depression   . Suicide attempt     x 3  . Osteoarthritis     right hip  . Fx clavicle     Hx  of left clavicle fx  . Hx: UTI (urinary tract infection)     Past Surgical History  Procedure Laterality Date  . Orif left clavicle fracture and pinning 2nd metacarpal  01/03/2010    History  Substance Use Topics  . Smoking status: Current Every Day Smoker -- 1.50 packs/day for 44 years    Types: Cigarettes  . Smokeless tobacco: Never Used  . Alcohol Use: 0.6 oz/week    0 Shots of liquor, 1 Cans of beer per week     Comment: was drinking beer daily now down to total 1.5 cans a week, quit hard liquor 1 month ago    Family History  Problem Relation Age of Onset  . ADD / ADHD Neg Hx   . Anxiety disorder Neg Hx   . Alcohol abuse Neg Hx   . Bipolar disorder Neg Hx   . Dementia Neg Hx   . Depression Neg Hx   . Drug abuse Neg Hx   . OCD Neg Hx   . Schizophrenia Neg Hx     No Known Allergies  Medication list has been reviewed and updated.  Current Outpatient Prescriptions on File Prior to Visit  Medication Sig Dispense Refill  . albuterol (PROVENTIL HFA;VENTOLIN HFA) 108 (90 BASE) MCG/ACT inhaler Inhale 3 puffs into the lungs 2 (two) times daily as needed for  wheezing or shortness of breath.    . ALPRAZolam (XANAX) 1 MG tablet Take 1 tablet (1 mg total) by mouth 2 (two) times daily. 60 tablet 5  . amLODipine (NORVASC) 5 MG tablet Take 1 tablet (5 mg total) by mouth daily. 90 tablet 3  . butalbital-acetaminophen-caffeine (FIORICET) 50-325-40 MG per tablet Take 1-2 tablets by mouth every 6 (six) hours as needed for headache. Max 6 per day 20 tablet 0  . furosemide (LASIX) 20 MG tablet Take 0.5 tablets (10 mg total) by mouth daily. 30 tablet 0  . Hyprom-Naphaz-Polysorb-Zn Sulf (CLEAR EYES COMPLETE OP) Place 1 drop into both eyes daily as needed (dry eyes).     Marland Kitchen. ibuprofen (ADVIL,MOTRIN) 200 MG tablet Take 400 mg by mouth every 6 (six) hours as needed for moderate pain.    Marland Kitchen. lisinopril (PRINIVIL,ZESTRIL) 40 MG tablet TAKE 1 TABLET (40 MG TOTAL)  BY MOUTH DAILY.  FOR HIGH BLOOD  PRESSURE CONTROL.  "NEEDS FOLLOW UP FOR ADDITIONAL REFILLS" (Patient taking differently: Take 40 mg by mouth daily. FOR HIGH BLOOD PRESSURE CONTROL.  "NEEDS FOLLOW UP FOR ADDITIONAL REFILLS") 30 tablet 0  . nicotine (NICODERM CQ - DOSED IN MG/24 HOURS) 21 mg/24hr patch Place 1 patch (21 mg total) onto the skin daily. 28 patch 0  . potassium chloride SA (K-DUR,KLOR-CON) 10 MEQ tablet Take 1 tablet (10 mEq total) by mouth once. 10 tablet 0  . promethazine (PHENERGAN) 25 MG tablet Take 1 tablet (25 mg total) by mouth every 8 (eight) hours as needed for nausea or vomiting. 20 tablet 0  . QUEtiapine (SEROQUEL) 50 MG tablet TAKE 1 TABLET 50 MG TOTAL BY MOUTH AT BEDTIME  NEEDS APPOINTMENT ADDITIONAL REFILLS (Patient taking differently: Take 50 mg by mouth at bedtime. NEEDS APPOINTMENT ADDITIONAL REFILLS) 30 tablet 1   No current facility-administered medications on file prior to visit.    Review of Systems:  As per HPI- otherwise negative.   Physical Examination: Filed Vitals:   05/14/14 1144  BP: 120/74  Pulse: 86  Temp: 99 F (37.2 C)  Resp: 16   Filed Vitals:   05/14/14 1144  Height: 5\' 2"  (1.575 m)  Weight: 155 lb 3.2 oz (70.398 kg)   Body mass index is 28.38 kg/(m^2). Ideal Body Weight: Weight in (lb) to have BMI = 25: 136.4  GEN: WDWN, NAD, Non-toxic, A & O x 3, looks well HEENT: Atraumatic, Normocephalic. Neck supple. No masses, No LAD. Ears and Nose: No external deformity. CV: RRR, No M/G/R. No JVD. No thrill. No extra heart sounds. PULM: CTA B, no wheezes, crackles, rhonchi. No retractions. No resp. distress. No accessory muscle use. EXTR: No c/c/e NEURO Normal gait.  PSYCH: Normally interactive. Conversant. Not depressed or anxious appearing.  Calm demeanor.    Assessment and Plan: Hyponatremia - Plan: Basic metabolic panel, Basic metabolic panel  Benign essential HTN  Anxiety and depression  She is doing better as far as her hyponatremia.  Continue lasix and  potassium Right now her BP looks fine, continue to hold norvasc She is off he celexa, ok with her xanax.   May start wellbutrin for her depression as soon as all is settled down Placed standing order for weekly BMP; she will plan to come in once a week  Signed Abbe AmsterdamJessica Copland, MD

## 2014-05-14 NOTE — Patient Instructions (Signed)
I am glad that you feeling better!  I will be in touch with today's labs.  Once your sodium is ok we can stop the lasix We may want to start you on wellbutrin for depression once things settle down.   For now continue your current dose of lasix and potassium Don't go back on amlodipine for your BP for now- your pressure looks fine.    I have placed a standing order for you to have your labs checked once a week; you do not have to wait to see a doctor to have this done.   Recently, a new recommendation has been made regarding screening for lung cancer using annual "low dose" CT scanning.  This service is recommended for people who are 6855- 60 years old, who currently smoke or quit in the last 15 years, and who smoked at least a pack per day for 30 years or more.    Some patients who are at least 60 years old and who smoked a pack per day for 20 years, or were exposed to second hand smoke may also qualify for screening.    [place] this service is available at [facility], [phone]. The exam costs about $300 but may be covered by insurance.

## 2014-05-15 LAB — BASIC METABOLIC PANEL
BUN: 8 mg/dL (ref 6–23)
CHLORIDE: 97 meq/L (ref 96–112)
CO2: 24 meq/L (ref 19–32)
Calcium: 8.9 mg/dL (ref 8.4–10.5)
Creat: 0.53 mg/dL (ref 0.50–1.10)
Glucose, Bld: 75 mg/dL (ref 70–99)
POTASSIUM: 4.8 meq/L (ref 3.5–5.3)
SODIUM: 132 meq/L — AB (ref 135–145)

## 2014-05-19 ENCOUNTER — Other Ambulatory Visit: Payer: Self-pay | Admitting: Family Medicine

## 2014-05-19 DIAGNOSIS — F411 Generalized anxiety disorder: Secondary | ICD-10-CM

## 2014-05-19 NOTE — Telephone Encounter (Signed)
Dr Patsy Lageropland, it looks like you are managing this for pt now.

## 2014-05-20 ENCOUNTER — Ambulatory Visit (INDEPENDENT_AMBULATORY_CARE_PROVIDER_SITE_OTHER): Payer: Managed Care, Other (non HMO) | Admitting: Family Medicine

## 2014-05-20 ENCOUNTER — Telehealth: Payer: Self-pay | Admitting: *Deleted

## 2014-05-20 VITALS — BP 158/92 | HR 103 | Temp 97.9°F | Resp 16 | Ht 62.0 in | Wt 155.6 lb

## 2014-05-20 DIAGNOSIS — R5383 Other fatigue: Secondary | ICD-10-CM

## 2014-05-20 DIAGNOSIS — F32A Depression, unspecified: Secondary | ICD-10-CM

## 2014-05-20 DIAGNOSIS — E871 Hypo-osmolality and hyponatremia: Secondary | ICD-10-CM

## 2014-05-20 DIAGNOSIS — F329 Major depressive disorder, single episode, unspecified: Secondary | ICD-10-CM

## 2014-05-20 LAB — BASIC METABOLIC PANEL
BUN: 4 mg/dL — AB (ref 6–23)
CO2: 28 meq/L (ref 19–32)
CREATININE: 0.5 mg/dL (ref 0.50–1.10)
Calcium: 9.2 mg/dL (ref 8.4–10.5)
Chloride: 98 mEq/L (ref 96–112)
Glucose, Bld: 89 mg/dL (ref 70–99)
Potassium: 4.1 mEq/L (ref 3.5–5.3)
Sodium: 136 mEq/L (ref 135–145)

## 2014-05-20 LAB — CK: CK TOTAL: 34 U/L (ref 7–177)

## 2014-05-20 MED ORDER — BUPROPION HCL ER (SR) 150 MG PO TB12: 150.0000 mg | ORAL_TABLET | Freq: Two times a day (BID) | ORAL | Status: DC

## 2014-05-20 NOTE — Patient Instructions (Signed)
I will give you a call or email with your results later today.  Start taking the wellbutrin once a day- after 3 days you can increase to twice a day.   Let me know how you are doing over the next couple of weeks- please send me a message or call

## 2014-05-20 NOTE — Progress Notes (Signed)
[facility] [address], [place] [address] [phone]  Date:  05/20/2014   Name:  Brenda Rice   DOB:  10/15/1955   MRN:  [id number]  PCP:  Abbe AmsterdamOPLAND,JESSICA, MD    Chief Complaint: Fatigue; Anxiety; and Headache   History of Present Illness:  Brenda Rice is a 58 y.o. very pleasant female patient who presents with the following:  Here today for follow-up.  She was here about 2 weeks ago now and noted to have hyponatremia; she was admitted and treated with lasix.  At most recent recheck her sodium looked a lot better.   She reports that she is still not feeling herself.  She feels depressed, "I can only stay awake for 3-4 hours at a time."  She has been irritable at home.  She stopped her celexa "cold [place]" when this was thought to be the cause of her hyponatremia.  She does not feel that she will be able to [place] [employer] [place] feeling the way she does. She stopped celexa about 10 days ago now- it is likely just clearing her system She is taking just 10mg  of lasix daily now.   She was concerned because she is not urinating as much as she thought she should with lasix  Patient Active Problem List   Diagnosis Date Noted  . MDD (major depressive disorder) 10/06/2013  . Alcohol dependence 05/26/2013  . GAD (generalized anxiety disorder) 05/26/2013  . Depressive disorder 05/26/2013  . Benzodiazepine dependence 05/25/2013  . Alcohol dependence syndrome 02/15/2013  . Suicide attempt by multiple drug overdose 02/15/2013  . Substance induced mood disorder 02/15/2013  . Nicotine dependence 02/15/2013  . COPD (chronic obstructive pulmonary disease) 02/15/2013  . Hyponatremia 07/05/2012  . Nicotine addiction 09/02/2011  . Hypertension 07/12/2011  . 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 Hypertension   . Depression   . Suicide attempt     x 3  . Osteoarthritis    right hip  . Fx clavicle     Hx of left clavicle fx  . Hx: UTI (urinary tract infection)     Past Surgical History  Procedure Laterality Date  . Orif left clavicle fracture and pinning 2nd metacarpal  01/03/2010    History  Substance Use Topics  . Smoking status: Current Every Day Smoker -- 1.50 packs/day for 44 years    Types: Cigarettes  . Smokeless tobacco: Never Used  . Alcohol Use: 0.6 oz/week    0 Shots of liquor, 1 Cans of beer per week     Comment: was drinking beer daily now down to total 1.5 cans a week, quit hard liquor 1 month ago    Family History  Problem Relation Age of Onset  . ADD / ADHD Neg Hx   . Anxiety disorder Neg Hx   . Alcohol abuse Neg Hx   . Bipolar disorder Neg Hx   . Dementia Neg Hx   . Depression Neg Hx   . Drug abuse Neg Hx   . OCD Neg Hx   . Schizophrenia Neg Hx     No Known Allergies  Medication list has been reviewed and updated.  Current Outpatient Prescriptions on File Prior to Visit  Medication Sig Dispense Refill  . albuterol (PROVENTIL HFA;VENTOLIN HFA) 108 (90 BASE) MCG/ACT inhaler Inhale 3 puffs into the lungs 2 (two) times daily as needed for  wheezing or shortness of breath.    . ALPRAZolam (XANAX) 1 MG tablet TAKE 1 TABLET BY MOUTH TWICE DAILY 60 tablet 1  . amLODipine (NORVASC) 5 MG tablet Take 1 tablet (5 mg total) by mouth daily. 90 tablet 3  . butalbital-acetaminophen-caffeine (FIORICET) 50-325-40 MG per tablet Take 1-2 tablets by mouth every 6 (six) hours as needed for headache. Max 6 per day 20 tablet 0  . furosemide (LASIX) 20 MG tablet Take 0.5 tablets (10 mg total) by mouth daily. 30 tablet 0  . Hyprom-Naphaz-Polysorb-Zn Sulf (CLEAR EYES COMPLETE OP) Place 1 drop into both eyes daily as needed (dry eyes).     Marland Kitchen. ibuprofen (ADVIL,MOTRIN) 200 MG tablet Take 400 mg by mouth every 6 (six) hours as needed for moderate pain.    Marland Kitchen. lisinopril (PRINIVIL,ZESTRIL) 40 MG tablet TAKE 1 TABLET (40 MG TOTAL)  BY MOUTH DAILY.  FOR HIGH  BLOOD PRESSURE CONTROL.  "NEEDS FOLLOW UP FOR ADDITIONAL REFILLS" (Patient taking differently: Take 40 mg by mouth daily. FOR HIGH BLOOD PRESSURE CONTROL.  "NEEDS FOLLOW UP FOR ADDITIONAL REFILLS") 30 tablet 0  . potassium chloride SA (K-DUR,KLOR-CON) 10 MEQ tablet Take 1 tablet (10 mEq total) by mouth once. 10 tablet 0  . promethazine (PHENERGAN) 25 MG tablet Take 1 tablet (25 mg total) by mouth every 8 (eight) hours as needed for nausea or vomiting. 20 tablet 0  . nicotine (NICODERM CQ - DOSED IN MG/24 HOURS) 21 mg/24hr patch Place 1 patch (21 mg total) onto the skin daily. (Patient not taking: Reported on 05/20/2014) 28 patch 0   No current facility-administered medications on file prior to visit.    Review of Systems:  As per HPI- otherwise negative. Denies any SI  Physical Examination: Filed Vitals:   05/20/14 0910  BP: 158/92  Pulse: 103  Temp: 97.9 F (36.6 C)  Resp: 16   Filed Vitals:   05/20/14 0910  Height: 5\' 2"  (1.575 m)  Weight: 155 lb 9.6 oz (70.58 kg)   Body mass index is 28.45 kg/(m^2). Ideal Body Weight: Weight in (lb) to have BMI = 25: 136.4  GEN: WDWN, NAD, Non-toxic, A & O x 3 HEENT: Atraumatic, Normocephalic. Neck supple. No masses, No LAD.   Ears and Nose: No external deformity. CV: RRR, No M/G/R. No JVD. No thrill. No extra heart sounds. PULM: CTA B, no wheezes, crackles, rhonchi. No retractions. No resp. distress. No accessory muscle use.Marland Kitchen. EXTR: No c/c/e NEURO Normal gait.  PSYCH: Normally interactive. Conversant. Not depressed or anxious appearing.  Calm demeanor.    Assessment and Plan: Fatigue due to depression - Plan: buPROPion (WELLBUTRIN SR) 150 MG 12 hr tablet, CK  Hyponatremia - Plan: Basic metabolic panel  Brenda Rice is here to follow-up from recent hospitalization for hyponatremia.  Due to her hyponatremia her celexa was stopped- she had been on this medication for a long time.  She did ok until the last few days- suspect the celexa has not  cleared her body so she has some withdrawal sx. Also, she continues to need treatment for her chronic anxiety and depression.   STAT BMP today to check her sodium level, CK for muscle aches Will start wellbutrin as treatment for anxiety and depression  Due to her withdrawal sx and need to get her anxiety and depression under control with her new medication she will not be at work from 11/30- 05/29/2014. Will fax her note to [facility] [employer] [employer] [facility] [phone] per her request.  Signed Lamar Blinks, MD

## 2014-05-20 NOTE — Telephone Encounter (Signed)
Left a message for patient put forms upfront for pick up.  Two fax numbers that were given would not transmit.

## 2014-05-26 ENCOUNTER — Telehealth: Payer: Self-pay

## 2014-05-26 NOTE — Telephone Encounter (Signed)
Calling to check on flu shot status. Received at work [employer] 03/2014

## 2014-05-26 NOTE — Telephone Encounter (Signed)
PATIENT STATES SHE NEEDS TO COME IN TOMORROW MORNING TO HAVE HER BLOOD DRAWN TO CHECK HER SODIUM. SHE WANTS TO BE SURE ANOTHER DOCTOR WILL BE ABLE TO GIVE HER THE RESULTS SINCE DR. COPLAND IS OUT OF THE OFFICE. SHE CANNOT GO BACK TO WORK UNLESS HER SODIUM IS WHERE IT NEEDS TO BE. BEST PHONE [phone] (CELL)  MBC

## 2014-05-27 NOTE — Telephone Encounter (Signed)
Pt will be in tomorrow morning instead. She is ok with waiting until next week to get lab results and rtn to work note.

## 2014-05-31 ENCOUNTER — Other Ambulatory Visit (INDEPENDENT_AMBULATORY_CARE_PROVIDER_SITE_OTHER): Payer: Managed Care, Other (non HMO)

## 2014-05-31 ENCOUNTER — Other Ambulatory Visit: Payer: Self-pay | Admitting: Physician Assistant

## 2014-05-31 ENCOUNTER — Other Ambulatory Visit: Payer: Self-pay | Admitting: Emergency Medicine

## 2014-05-31 DIAGNOSIS — E871 Hypo-osmolality and hyponatremia: Secondary | ICD-10-CM

## 2014-05-31 LAB — BASIC METABOLIC PANEL
BUN: 8 mg/dL (ref 6–23)
CALCIUM: 9.8 mg/dL (ref 8.4–10.5)
CHLORIDE: 98 meq/L (ref 96–112)
CO2: 29 mEq/L (ref 19–32)
Creat: 0.78 mg/dL (ref 0.50–1.10)
Glucose, Bld: 95 mg/dL (ref 70–99)
Potassium: 4.7 mEq/L (ref 3.5–5.3)
Sodium: 139 mEq/L (ref 135–145)

## 2014-06-02 ENCOUNTER — Telehealth: Payer: Self-pay

## 2014-06-02 ENCOUNTER — Encounter: Payer: Self-pay | Admitting: Internal Medicine

## 2014-06-02 NOTE — Telephone Encounter (Signed)
PT STATES SHE MUST HAVE RESULTS OF HER LABS AS SOON AS POSSIBLE   BEST PHONE FOR PT IS [phone]

## 2014-06-03 NOTE — Telephone Encounter (Signed)
Pt called back and sates that she is taking  lisinopril 40 mg once a day and xanax 1 mg twice a day

## 2014-06-03 NOTE — Telephone Encounter (Signed)
Dr. Patsy Lageropland sent a letter: All looks great! At this point I think you can stop the furosemide (fluid pill) and let's recheck a BMP in about 2 weeks.     Are you currently back on your lisinopril and amlodipine for your blood pressure?    LM for pt with information and asked for a rtn call and answer Dr. Cyndie Chimeopland's question.

## 2014-06-07 ENCOUNTER — Encounter: Payer: Self-pay | Admitting: Family Medicine

## 2014-06-07 ENCOUNTER — Telehealth: Payer: Self-pay

## 2014-06-07 ENCOUNTER — Other Ambulatory Visit: Payer: Self-pay | Admitting: Family Medicine

## 2014-06-07 ENCOUNTER — Ambulatory Visit (INDEPENDENT_AMBULATORY_CARE_PROVIDER_SITE_OTHER): Payer: Managed Care, Other (non HMO)

## 2014-06-07 ENCOUNTER — Ambulatory Visit (INDEPENDENT_AMBULATORY_CARE_PROVIDER_SITE_OTHER): Payer: Managed Care, Other (non HMO) | Admitting: Family Medicine

## 2014-06-07 VITALS — BP 160/100 | HR 110 | Temp 98.0°F | Resp 18 | Ht 62.0 in | Wt 158.0 lb

## 2014-06-07 DIAGNOSIS — R51 Headache: Secondary | ICD-10-CM

## 2014-06-07 DIAGNOSIS — F411 Generalized anxiety disorder: Secondary | ICD-10-CM

## 2014-06-07 DIAGNOSIS — R197 Diarrhea, unspecified: Secondary | ICD-10-CM

## 2014-06-07 DIAGNOSIS — R112 Nausea with vomiting, unspecified: Secondary | ICD-10-CM

## 2014-06-07 DIAGNOSIS — R519 Headache, unspecified: Secondary | ICD-10-CM

## 2014-06-07 MED ORDER — ALPRAZOLAM 1 MG PO TABS: 1.0000 mg | ORAL_TABLET | Freq: Two times a day (BID) | ORAL | Status: DC

## 2014-06-07 MED ORDER — ACETAMINOPHEN 325 MG PO TABS: 1000.0000 mg | ORAL_TABLET | Freq: Four times a day (QID) | ORAL | Status: DC | PRN

## 2014-06-07 MED ORDER — PROMETHAZINE HCL 25 MG PO TABS: 25.0000 mg | ORAL_TABLET | Freq: Three times a day (TID) | ORAL | Status: DC | PRN

## 2014-06-07 NOTE — Progress Notes (Signed)
[facility] [address], [place] [address] [phone]  Date:  06/07/2014   Name:  Brenda Rice   DOB:  09/06/1955   MRN:  [id number]  PCP:  Brenda AmsterdamOPLAND,Cosima Prentiss, MD    Chief Complaint: Migraine; Diarrhea; and Nausea   History of Present Illness:  Brenda Rice is a 58 y.o. very pleasant female patient who presents with the following:  Here today with illness.  When she was in the hospital with hyponatrmia (in November, about one month ago) she had some loose stools/ diarrhea. During that time she would have "no control" over her stools and had a couple of accidents.  She was negative for c diff.  No recent abxShe started having diarrhea again about one week ago. She is using imodium as needed She then got a HA again today while she was at work- it seems to be quite severe.   Seems like a typical migraine to her but she had to leave work She feels bloated when she eats as of late. No fever.  She did vomit about a week ago just for a day or so but she thinks it was due to eating something that did not agree with her. This is since resolved and she is eating ok  She has not tried any of her fioricet or tramadol- she is out of these or just did not use them, this is not really clear.  She finds that she cannot use the wellbutrin due to nausea and vomiting after use.  Also, it is too expensive for her to afford easily  Her BP on Friday was 172/101. She laid down at rested and then her BP was 132/87 She is taking lisinopril 40 right now.  We had not added back the amlodipine as of yet.    She took just some ibuprofen this am, nothing else for pain.   Last BM earlier toda. She needs a RF of her xanax- she is taking it BID plus another 0.5mg  dose during the day some of the time since her job has been so busy and stressful   Also reports that she has trouble affording her wellbutrin- we took her off celexa as it may have contributed to her hyponatremia Labs from one week  ago showed her hyponatremia to be resolved   Chemistry      Component Value Date/Time   NA 139 05/31/2014 1711   K 4.7 05/31/2014 1711   CL 98 05/31/2014 1711   CO2 29 05/31/2014 1711   BUN 8 05/31/2014 1711   CREATININE 0.78 05/31/2014 1711   CREATININE 0.47* 05/12/2014 0430      Component Value Date/Time   CALCIUM 9.8 05/31/2014 1711   ALKPHOS 102 05/06/2014 2127   AST 29 05/06/2014 2127   ALT 19 05/06/2014 2127   BILITOT 0.6 05/06/2014 2127       Patient Active Problem List   Diagnosis Date Noted  . MDD (major depressive disorder) 10/06/2013  . Alcohol dependence 05/26/2013  . GAD (generalized anxiety disorder) 05/26/2013  . Depressive disorder 05/26/2013  . Benzodiazepine dependence 05/25/2013  . Alcohol dependence syndrome 02/15/2013  . Suicide attempt by multiple drug overdose 02/15/2013  . Substance induced mood disorder 02/15/2013  . Nicotine dependence 02/15/2013  . COPD (chronic obstructive pulmonary disease) 02/15/2013  . Hyponatremia 07/05/2012  . Nicotine addiction 09/02/2011  . Hypertension 07/12/2011  . 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 Hypertension   . Depression   . Suicide attempt     x 3  . Osteoarthritis     right hip  . Fx clavicle     Hx of left clavicle fx  . Hx: UTI (urinary tract infection)     Past Surgical History  Procedure Laterality Date  . Orif left clavicle fracture and pinning 2nd metacarpal  01/03/2010    History  Substance Use Topics  . Smoking status: Current Every Day Smoker -- 1.50 packs/day for 44 years    Types: Cigarettes  . Smokeless tobacco: Never Used  . Alcohol Use: 0.6 oz/week    0 Shots of liquor, 1 Cans of beer per week     Comment: was drinking beer daily now down to total 1.5 cans a week, quit hard liquor 1 month ago    Family History  Problem Relation Age of Onset  . ADD / ADHD Neg Hx   . Anxiety disorder Neg Hx    . Alcohol abuse Neg Hx   . Bipolar disorder Neg Hx   . Dementia Neg Hx   . Depression Neg Hx   . Drug abuse Neg Hx   . OCD Neg Hx   . Schizophrenia Neg Hx     Allergies  Allergen Reactions  . Celexa [Citalopram Hydrobromide]     Hyponatremia thought due to SSRI    Medication list has been reviewed and updated.  Current Outpatient Prescriptions on File Prior to Visit  Medication Sig Dispense Refill  . albuterol (PROVENTIL HFA;VENTOLIN HFA) 108 (90 BASE) MCG/ACT inhaler Inhale 3 puffs into the lungs 2 (two) times daily as needed for wheezing or shortness of breath.    . butalbital-acetaminophen-caffeine (FIORICET) 50-325-40 MG per tablet Take 1-2 tablets by mouth every 6 (six) hours as needed for headache. Max 6 per day 20 tablet 0  . ibuprofen (ADVIL,MOTRIN) 200 MG tablet Take 400 mg by mouth every 6 (six) hours as needed for moderate pain.    Marland Kitchen lisinopril (PRINIVIL,ZESTRIL) 40 MG tablet TAKE 1 TABLET (40 MG TOTAL)  BY MOUTH DAILY.  FOR HIGH BLOOD PRESSURE CONTROL.  "NEEDS FOLLOW UP FOR ADDITIONAL REFILLS" (Patient taking differently: Take 40 mg by mouth daily. FOR HIGH BLOOD PRESSURE CONTROL.  "NEEDS FOLLOW UP FOR ADDITIONAL REFILLS") 30 tablet 0  . ALPRAZolam (XANAX) 1 MG tablet TAKE 1 TABLET BY MOUTH TWICE DAILY (Patient not taking: Reported on 06/07/2014) 60 tablet 1  . amLODipine (NORVASC) 5 MG tablet Take 1 tablet (5 mg total) by mouth daily. (Patient not taking: Reported on 06/07/2014) 90 tablet 3  . buPROPion (WELLBUTRIN SR) 150 MG 12 hr tablet Take 1 tablet (150 mg total) by mouth 2 (two) times daily. (Patient not taking: Reported on 06/07/2014) 60 tablet 3  . furosemide (LASIX) 20 MG tablet Take 0.5 tablets (10 mg total) by mouth daily. (Patient not taking: Reported on 06/07/2014) 30 tablet 0  . Hyprom-Naphaz-Polysorb-Zn Sulf (CLEAR EYES COMPLETE OP) Place 1 drop into both eyes daily as needed (dry eyes).     . nicotine (NICODERM CQ - DOSED IN MG/24 HOURS) 21 mg/24hr patch  Place 1 patch (21 mg total) onto the skin daily. (Patient not taking: Reported on 05/20/2014) 28 patch 0  . potassium chloride SA (K-DUR,KLOR-CON) 10 MEQ tablet Take 1 tablet (10 mEq total) by mouth once. (Patient not taking: Reported on 06/07/2014) 10 tablet 0  . promethazine (PHENERGAN) 25 MG tablet Take 1 tablet (25 mg  total) by mouth every 8 (eight) hours as needed for nausea or vomiting. (Patient not taking: Reported on 06/07/2014) 20 tablet 0   No current facility-administered medications on file prior to visit.    Review of Systems:  As per HPI- otherwise negative.   Physical Examination: Filed Vitals:   06/07/14 1411  BP: 160/100  Pulse: 110  Temp: 98 F (36.7 C)  Resp: 18   Filed Vitals:   06/07/14 1411  Height: 5\' 2"  (1.575 m)  Weight: 158 lb (71.668 kg)   Body mass index is 28.89 kg/(m^2). Ideal Body Weight: Weight in (lb) to have BMI = 25: 136.4  GEN: WDWN, NAD, Non-toxic, A & O x 3, looks well HEENT: Atraumatic, Normocephalic. Neck supple. No masses, No LAD.  Bilateral TM wnl, oropharynx normal.  PEERL,EOMI.   Ears and Nose: No external deformity. CV: RRR, No M/G/R. No JVD. No thrill. No extra heart sounds. PULM: CTA B, no wheezes, crackles, rhonchi. No retractions. No resp. distress. No accessory muscle use. ABD: S, NT, ND, +BS. No rebound. No HSM. EXTR: No c/c/e NEURO Normal gait.  PSYCH: Normally interactive. Conversant. Not depressed or anxious appearing.  Calm demeanor.   [facility] reading (PRIMARY) by  Dr. Patsy Lageropland. abd series: negative  ABDOMEN - 2 VIEW  COMPARISON: Ultrasound pelvis 04/09/2014. Plain films 03/10/2014.  FINDINGS: Single view of the abdomen and pelvis, supine. Non-obstructive bowel gas pattern. No abnormal abdominal calcifications. No appendicolith. Vascular calcifications. Right greater the left hip osteoarthritis. Minimal convex left lumbar spine curvature.  IMPRESSION: No acute findings.  Encouraged her to have IV fluids,  repeat labs and perhaps a shot of phenergan.  However she "just wants to get home and to bed" and declines these measures.  She does not have a ride home so I wrote an rx for phenergan instead Assessment and Plan: Diarrhea - Plan: DG Abd 2 Views, POCT CBC, Comprehensive metabolic panel  Headache disorder - Plan: acetaminophen (TYLENOL) tablet 975 mg  Non-intractable vomiting with nausea, vomiting of unspecified type - Plan: promethazine (PHENERGAN) 25 MG tablet  GAD (generalized anxiety disorder) - Plan: ALPRAZolam (XANAX) 1 MG tablet  Brenda Rice is here today with headache and loose stools.  Discussed further evaluation and treatment today as above but she declines, feels as though she just wants to get home and rest.  Will rx phenergan for her to use as needed for nausea and headache. She has used this in the past with success for headache.  We are leery of excessive NSAID use. She agrees to follow-up closely if she dos not feel better.  Planned to add back amlodipine as her BP is too high.  She alerted me that [employer] will be sending a form for me to excuse her absence- this is fine.   Called her around 8:15 pm.  She was just getting up from a nap, BP at 134/76, 143/98 per home machine.  She was feeling better  Meds ordered this encounter  Medications  . acetaminophen (TYLENOL) tablet 975 mg    Sig:   . promethazine (PHENERGAN) 25 MG tablet    Sig: Take 1 tablet (25 mg total) by mouth every 8 (eight) hours as needed for nausea or vomiting.    Dispense:  20 tablet    Refill:  0  . ALPRAZolam (XANAX) 1 MG tablet    Sig: Take 1 tablet (1 mg total) by mouth 2 (two) times daily. May take an additional 0.5 mg once a day if needed  Dispense:  75 tablet    Refill:  1    This request is for a new prescription for a controlled substance as required by Federal/State law..     Signed Brenda Amsterdam, MD

## 2014-06-07 NOTE — Telephone Encounter (Signed)
LM for pt to rtn call. 

## 2014-06-07 NOTE — Telephone Encounter (Signed)
Patient was just seen by dr. Patsy Lagercopland and was prescribed medications. Patient went to the pharmacy and per patient her Xanax were nor medication for her headache. Patient picked up medications but is confused on what each one is for. Please call patient to clarify at [phone] [address]

## 2014-06-07 NOTE — Telephone Encounter (Signed)
Patient called explaining that pharmacy says they will not fill xanax because its too early. Told that Dr. Patsy Lageropland has verbally called it in. Patient says no she didn't and  that pharmacy wont fill. I really don't know how else to explain patient but she hung up on me. She said she was at pharmacy and is not at home.

## 2014-06-07 NOTE — Telephone Encounter (Signed)
Patient's husband called to report wife's current blood pressure. 134/76

## 2014-06-07 NOTE — Patient Instructions (Addendum)
Go back on your amlodipine (norvasc) for your blood pressure Use the phenergan as needed for nausea and headache I will work on getting your wellbutrin approved.    Rest and let me know if your blood pressure does not come down!

## 2014-06-08 ENCOUNTER — Telehealth: Payer: Self-pay | Admitting: Family Medicine

## 2014-06-08 DIAGNOSIS — I10 Essential (primary) hypertension: Secondary | ICD-10-CM

## 2014-06-08 MED ORDER — AMLODIPINE BESYLATE 5 MG PO TABS: 5.0000 mg | ORAL_TABLET | Freq: Every day | ORAL | Status: DC

## 2014-06-08 NOTE — Telephone Encounter (Signed)
Her BP looks ok- 140/90s this evening.  She is doing ok and plans to bring me some work forms to sign tomorrow

## 2014-06-10 ENCOUNTER — Telehealth: Payer: Self-pay | Admitting: Family Medicine

## 2014-06-10 ENCOUNTER — Encounter: Payer: Self-pay | Admitting: Family Medicine

## 2014-06-10 NOTE — Telephone Encounter (Signed)
Patient faxed FMLA paperwork for Dr Patsy Lageropland to complete. Fax received on 06/09/2014 and placed in Dr. Cyndie Chimeopland's box on 06/10/2014. Awaiting completion in 5-7 business days. Please return to FMLA/Disabiliites when finished.

## 2014-06-14 NOTE — Telephone Encounter (Signed)
PPW [facility]. Scanned into Epic, and can be found under the Media tab labeled "[facility] [employer] 06/14/14, for future reference.

## 2014-06-23 ENCOUNTER — Encounter: Payer: Self-pay | Admitting: Family Medicine

## 2014-07-02 ENCOUNTER — Other Ambulatory Visit: Payer: Self-pay

## 2014-07-02 MED ORDER — HYDROCHLOROTHIAZIDE 25 MG PO TABS: 25.0000 mg | ORAL_TABLET | Freq: Every day | ORAL | Status: DC

## 2014-07-04 ENCOUNTER — Other Ambulatory Visit: Payer: Self-pay | Admitting: Physician Assistant

## 2014-07-04 ENCOUNTER — Other Ambulatory Visit: Payer: Self-pay | Admitting: Emergency Medicine

## 2014-07-04 ENCOUNTER — Other Ambulatory Visit: Payer: Self-pay | Admitting: Family Medicine

## 2014-07-04 DIAGNOSIS — I10 Essential (primary) hypertension: Secondary | ICD-10-CM

## 2014-07-04 DIAGNOSIS — F39 Unspecified mood [affective] disorder: Secondary | ICD-10-CM

## 2014-07-05 ENCOUNTER — Other Ambulatory Visit: Payer: Self-pay | Admitting: Family Medicine

## 2014-07-08 ENCOUNTER — Other Ambulatory Visit (HOSPITAL_COMMUNITY): Payer: Self-pay | Admitting: *Deleted

## 2014-07-08 NOTE — Telephone Encounter (Signed)
Pharmacy request for refill of Citalopram. Chart reviewed. Refill not appropriate, pt not seen since 10/22/13 with no future appointments. Pharmacy notified.

## 2014-07-19 ENCOUNTER — Ambulatory Visit (INDEPENDENT_AMBULATORY_CARE_PROVIDER_SITE_OTHER): Payer: Managed Care, Other (non HMO) | Admitting: Family Medicine

## 2014-07-19 ENCOUNTER — Telehealth: Payer: Self-pay | Admitting: *Deleted

## 2014-07-19 ENCOUNTER — Ambulatory Visit (INDEPENDENT_AMBULATORY_CARE_PROVIDER_SITE_OTHER): Payer: Managed Care, Other (non HMO)

## 2014-07-19 VITALS — BP 170/100 | HR 96 | Temp 98.5°F | Resp 16 | Ht 62.0 in | Wt 162.0 lb

## 2014-07-19 DIAGNOSIS — E871 Hypo-osmolality and hyponatremia: Secondary | ICD-10-CM

## 2014-07-19 DIAGNOSIS — F411 Generalized anxiety disorder: Secondary | ICD-10-CM

## 2014-07-19 DIAGNOSIS — I10 Essential (primary) hypertension: Secondary | ICD-10-CM

## 2014-07-19 DIAGNOSIS — M25551 Pain in right hip: Secondary | ICD-10-CM

## 2014-07-19 DIAGNOSIS — G43001 Migraine without aura, not intractable, with status migrainosus: Secondary | ICD-10-CM

## 2014-07-19 MED ORDER — ALPRAZOLAM 1 MG PO TABS: 1.0000 mg | ORAL_TABLET | Freq: Two times a day (BID) | ORAL | Status: DC

## 2014-07-19 MED ORDER — LISINOPRIL 40 MG PO TABS: ORAL_TABLET | ORAL | Status: DC

## 2014-07-19 MED ORDER — HYDROCODONE-ACETAMINOPHEN 5-325 MG PO TABS: 1.0000 | ORAL_TABLET | Freq: Three times a day (TID) | ORAL | Status: DC | PRN

## 2014-07-19 NOTE — Patient Instructions (Signed)
Please look and see if you are taking hydrochlorothiazide (HCTZ) at home.  This is a type of BP medication Please send me a mychart message In any case, double up on your norvasc (amlodipine)- take 10 mg Use the pain medication as needed but remember it will make you drowsy and does contain some tylenol.  Your xanax of course can also cause sedation.   We will get you set up to see orthopedics asap.  Let me know if you are getting worse!    Remember max dose of tylenol (acetaminophen) from all sources is 4gm (4,000 mg) daily

## 2014-07-19 NOTE — Telephone Encounter (Signed)
Pt needs to come back in for blood draw.  Blood was not drawn.  Lab light was not hit.  Called and left message on machine that she needed to come back in at her convenience  to draw her blood.

## 2014-07-19 NOTE — Progress Notes (Signed)
[facility] [address] [place] [address] [phone]  Date:  07/19/2014   Name:  PANDORA MCCRACKIN   DOB:  October 11, 1955   MRN:  [id number]  PCP:  Abbe Amsterdam, MD    Chief Complaint: Hip Pain; Migraine; and Medication Refill   History of Present Illness:  Nonie Lochner Mruk is a 59 y.o. very pleasant female patient who presents with the following:  She is here today for a few issues- her main problem is her right hip.  She has had a lot of pain with this hip for a while- she thinks it is time to think about a hip replacement.  This past saturday she "stepped on it wrong" and had even more pain.  She is using crutches and a cane.  She needs a referral to ortho- she does not have any perference.  She is currently taking tylenol for pain but notes that sometimes the pain is so bad she is not able to walk even with a cane or crutches  She feels that her pain is causing her migraine HA to get worse; she tends to have a lot of problems with headaches and has noted a HA the last couple of days.    She was hospitalized with hyponatremia back in November.  At that time her HCTZ was stopped.  Since then we have noted her BP creeping back up- she is on lisinopril 40 and amlodipine .    BP Readings from Last 3 Encounters:  07/19/14 170/100  06/07/14 160/100  05/20/14 158/92   Tykeisha also needs a RF of her xanax,  She is doing well as far as not drinking.    Patient Active Problem List   Diagnosis Date Noted  . MDD (major depressive disorder) 10/06/2013  . Alcohol dependence 05/26/2013  . GAD (generalized anxiety disorder) 05/26/2013  . Depressive disorder 05/26/2013  . Benzodiazepine dependence 05/25/2013  . Alcohol dependence syndrome 02/15/2013  . Suicide attempt by multiple drug overdose 02/15/2013  . Substance induced mood disorder 02/15/2013  . Nicotine dependence 02/15/2013  . COPD (chronic obstructive pulmonary disease) 02/15/2013  . Hyponatremia 07/05/2012   . Nicotine addiction 09/02/2011  . Hypertension 07/12/2011  . 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 Hypertension   . Depression   . Suicide attempt     x 3  . Osteoarthritis     right hip  . Fx clavicle     Hx of left clavicle fx  . Hx: UTI (urinary tract infection)     Past Surgical History  Procedure Laterality Date  . Orif left clavicle fracture and pinning 2nd metacarpal  01/03/2010    History  Substance Use Topics  . Smoking status: Current Every Day Smoker -- 1.50 packs/day for 44 years    Types: Cigarettes  . Smokeless tobacco: Never Used  . Alcohol Use: 0.6 oz/week    0 Shots of liquor, 1 Cans of beer per week     Comment: was drinking beer daily now down to total 1.5 cans a week, quit hard liquor 1 month ago    Family History  Problem Relation Age of Onset  . ADD / ADHD Neg Hx   . Anxiety disorder Neg Hx   . Alcohol abuse Neg Hx   . Bipolar disorder Neg Hx   . Dementia Neg Hx   . Depression Neg Hx   . Drug  abuse Neg Hx   . OCD Neg Hx   . Schizophrenia Neg Hx     Allergies  Allergen Reactions  . Celexa [Citalopram Hydrobromide]     Hyponatremia thought due to SSRI    Medication list has been reviewed and updated.  Current Outpatient Prescriptions on File Prior to Visit  Medication Sig Dispense Refill  . albuterol (PROVENTIL HFA;VENTOLIN HFA) 108 (90 BASE) MCG/ACT inhaler Inhale 3 puffs into the lungs 2 (two) times daily as needed for wheezing or shortness of breath.    Marland Kitchen. amLODipine (NORVASC) 5 MG tablet Take 1 tablet (5 mg total) by mouth daily. 90 tablet 3  . butalbital-acetaminophen-caffeine (FIORICET) 50-325-40 MG per tablet Take 1-2 tablets by mouth every 6 (six) hours as needed for headache. Max 6 per day 20 tablet 0  . hydrochlorothiazide (HYDRODIURIL) 25 MG tablet Take 1 tablet (25 mg total) by mouth daily. 90 tablet 0  . Hyprom-Naphaz-Polysorb-Zn Sulf (CLEAR  EYES COMPLETE OP) Place 1 drop into both eyes daily as needed (dry eyes).     Marland Kitchen. ibuprofen (ADVIL,MOTRIN) 200 MG tablet Take 400 mg by mouth every 6 (six) hours as needed for moderate pain.    Marland Kitchen. lisinopril (PRINIVIL,ZESTRIL) 40 MG tablet TAKE 1 TABLET BY MOUTH EVERY DAY FOR BLOOD PRESSURE CONTROL 30 tablet 6  . nicotine (NICODERM CQ - DOSED IN MG/24 HOURS) 21 mg/24hr patch Place 1 patch (21 mg total) onto the skin daily. 28 patch 0  . promethazine (PHENERGAN) 25 MG tablet Take 1 tablet (25 mg total) by mouth every 8 (eight) hours as needed for nausea or vomiting. 20 tablet 0  . ALPRAZolam (XANAX) 1 MG tablet Take 1 tablet (1 mg total) by mouth 2 (two) times daily. May take an additional 0.5 mg once a day if needed (Patient not taking: Reported on 07/19/2014) 75 tablet 1  . furosemide (LASIX) 20 MG tablet Take 0.5 tablets (10 mg total) by mouth daily. (Patient not taking: Reported on 06/07/2014) 30 tablet 0  . QUEtiapine (SEROQUEL) 50 MG tablet TAKE 1 TABLET 50 MG TOTAL BY MOUTH AT BEDTIME **NEEDS APPOINTMENT ADDITIONAL REFILLS (Patient not taking: Reported on 07/19/2014) 30 tablet 2   Current Facility-Administered Medications on File Prior to Visit  Medication Dose Route Frequency Provider Last Rate Last Dose  . acetaminophen (TYLENOL) tablet 975 mg  975 mg Oral Q6H PRN Pearline CablesJessica C Egypt Marchiano, MD        Review of Systems:  As per HPI- otherwise negative.   Physical Examination: Filed Vitals:   07/19/14 1508  BP: 170/100  Pulse: 96  Temp: 98.5 F (36.9 C)  Resp: 16   Filed Vitals:   07/19/14 1508  Height: 5\' 2"  (1.575 m)  Weight: 162 lb (73.483 kg)   Body mass index is 29.62 kg/(m^2). Ideal Body Weight: Weight in (lb) to have BMI = 25: 136.4  GEN: WDWN, NAD, Non-toxic, A & O x 3, overweight HEENT: Atraumatic, Normocephalic. Neck supple. No masses, No LAD. Ears and Nose: No external deformity. CV: RRR, No M/G/R. No JVD. No thrill. No extra heart sounds. PULM: CTA B, no wheezes,  crackles, rhonchi. No retractions. No resp. distress. No accessory muscle use. ABD: S, NT, ND, +BS. No rebound. No HSM. EXTR: No c/c/e NEURO favoring right and using a cane.  Otherwise using all extremities normally, normal speech  PSYCH: Normally interactive. Conversant. Not depressed or anxious appearing.  Calm demeanor.  Right hip: she has pain with flexion and rotation of the joint.  No pain in the femur or knee.    [facility] reading (PRIMARY) by  Dr. Patsy Lager. Right hip:  Negative for fracture,  Some OA.    DG HIP W/ PELVIS 2-3V*R*  COMPARISON: 06/07/2014. 10/05/2010 .  FINDINGS: Degenerative changes lumbar spine and both hips. No acute bony abnormality identified. Tiny sclerotic density left iliac wing, unchanged and consistent with a bone island. Pelvic phleboliths. Peripheral vascular calcification.  IMPRESSION: 1. No acute abnormality. Degenerative changes lumbar spine and both hips. 2. Peripheral vascular disease.  Assessment and Plan: Migraine without aura and with status migrainosus, not intractable  Hyponatremia - Plan: Basic metabolic panel  Right hip pain - Plan: DG HIP UNILAT WITH PELVIS 2-3 VIEWS RIGHT, Ambulatory referral to Orthopedic Surgery, HYDROcodone-acetaminophen (NORCO/VICODIN) 5-325 MG per tablet  Essential hypertension - Plan: lisinopril (PRINIVIL,ZESTRIL) 40 MG tablet, DISCONTINUED: lisinopril (PRINIVIL,ZESTRIL) 40 MG tablet  GAD (generalized anxiety disorder) - Plan: ALPRAZolam (XANAX) 1 MG tablet  Since stopping her HCTZ her BP has gone back up and is now too high.  (confirmed that she is NOT taking this). She will increase her amlodipine to .  Her hyponatremia is thought to be due to SSRI use.  Assuming her labs still look good we can also cautiously go back on HCTZ at 12.5 mg Migraine headache: common for her.  She is not in any distress today.  Will work on getting her BP down, she has FMLA to use as needed for her frequent migraine  absences Right hip pain; she is at the point of considering joint replacement.  Will refer to ortho,  Gave hydrocodone to use in the meantime but cautioned regarding sedation  Signed Abbe Amsterdam, MD

## 2014-07-22 ENCOUNTER — Other Ambulatory Visit (INDEPENDENT_AMBULATORY_CARE_PROVIDER_SITE_OTHER): Payer: Managed Care, Other (non HMO) | Admitting: *Deleted

## 2014-07-22 ENCOUNTER — Telehealth: Payer: Self-pay

## 2014-07-22 ENCOUNTER — Encounter: Payer: Self-pay | Admitting: Family Medicine

## 2014-07-22 DIAGNOSIS — E871 Hypo-osmolality and hyponatremia: Secondary | ICD-10-CM

## 2014-07-22 DIAGNOSIS — I10 Essential (primary) hypertension: Secondary | ICD-10-CM

## 2014-07-22 NOTE — Telephone Encounter (Signed)
The patient wanted to give a message to Dr. Patsy Lageropland regarding her hydrocodone medication.  She said that Dr. Patsy Lageropland inquired about whether she had any remaining medication; the patient said that she is out of the medication.  The patient also wants a letter for her work that allows her to be assigned a visitor parking space that is closer to the building.  She said that she is using a cane and needs to have hip replacement surgery soon, and she wants to have a parking space closer to the building.  She said that a doctor's note explaining her needs for a closer space will allow her such access.  Please advise.  Thank you.  CB#: [phone]

## 2014-07-23 LAB — BASIC METABOLIC PANEL
BUN: 8 mg/dL (ref 6–23)
CALCIUM: 9 mg/dL (ref 8.4–10.5)
CO2: 22 meq/L (ref 19–32)
Chloride: 100 mEq/L (ref 96–112)
Creat: 0.59 mg/dL (ref 0.50–1.10)
Glucose, Bld: 94 mg/dL (ref 70–99)
Potassium: 3.8 mEq/L (ref 3.5–5.3)
Sodium: 137 mEq/L (ref 135–145)

## 2014-07-24 MED ORDER — HYDROCHLOROTHIAZIDE 12.5 MG PO CAPS: 12.5000 mg | ORAL_CAPSULE | Freq: Every day | ORAL | Status: DC

## 2014-07-24 NOTE — Telephone Encounter (Signed)
I happened to see her in clinic- she was actually talking about hydrochlorothiazide, not hydrocodone.  We wanted to make sure her sodium was ok prior to considering this medication again- as her na is normal I will let her start back on 12.5 mg to bring her BP under better control  BP Readings from Last 3 Encounters:  07/19/14 170/100  06/07/14 160/100  05/20/14 158/92   Also did letter for her parking at work as she had requested

## 2014-07-28 ENCOUNTER — Other Ambulatory Visit: Payer: Self-pay

## 2014-07-28 DIAGNOSIS — I10 Essential (primary) hypertension: Secondary | ICD-10-CM

## 2014-07-28 MED ORDER — HYDROCHLOROTHIAZIDE 12.5 MG PO CAPS: 12.5000 mg | ORAL_CAPSULE | Freq: Every day | ORAL | Status: DC

## 2014-08-03 ENCOUNTER — Telehealth: Payer: Self-pay

## 2014-08-03 NOTE — Telephone Encounter (Signed)
Requesting copy of x-ray on Right Hip.  A couple of weeks ago, images taken.  Will complete Med Release Form when she comes in.   [phone] (H)

## 2014-08-04 NOTE — Telephone Encounter (Signed)
Sent this to xray.

## 2014-08-04 NOTE — Telephone Encounter (Signed)
At the patient's request, I made a copy on CD of her Rt Hip x-ray, and put it in pick up drawer.

## 2014-08-07 ENCOUNTER — Other Ambulatory Visit: Payer: Self-pay | Admitting: Family Medicine

## 2014-08-10 NOTE — Telephone Encounter (Signed)
Dr Patsy Lageropland, you have seen pt several times recently but not for asthma. Do you want to give RFs?

## 2014-09-10 ENCOUNTER — Ambulatory Visit (INDEPENDENT_AMBULATORY_CARE_PROVIDER_SITE_OTHER): Payer: Managed Care, Other (non HMO) | Admitting: Family Medicine

## 2014-09-10 VITALS — BP 140/90 | HR 60 | Temp 98.2°F | Resp 16 | Ht 62.5 in | Wt 161.0 lb

## 2014-09-10 DIAGNOSIS — M25551 Pain in right hip: Secondary | ICD-10-CM | POA: Diagnosis not present

## 2014-09-10 DIAGNOSIS — Z23 Encounter for immunization: Secondary | ICD-10-CM

## 2014-09-10 DIAGNOSIS — F329 Major depressive disorder, single episode, unspecified: Secondary | ICD-10-CM

## 2014-09-10 DIAGNOSIS — F32A Depression, unspecified: Secondary | ICD-10-CM

## 2014-09-10 DIAGNOSIS — R635 Abnormal weight gain: Secondary | ICD-10-CM

## 2014-09-10 DIAGNOSIS — E871 Hypo-osmolality and hyponatremia: Secondary | ICD-10-CM

## 2014-09-10 MED ORDER — HYDROCODONE-ACETAMINOPHEN 5-325 MG PO TABS: 1.0000 | ORAL_TABLET | Freq: Three times a day (TID) | ORAL | Status: DC | PRN

## 2014-09-10 NOTE — Progress Notes (Signed)
[facility] [address], [place] [id number] [phone]  Date:  09/10/2014   Name:  Brenda Rice   DOB:  12/07/1955   MRN:  [id number]  PCP:  Abbe AmsterdamOPLAND,Aydon Swamy, MD    Chief Complaint: Medication Refill and Medication Problem   History of Present Illness:  Brenda Rice is a 59 y.o. very pleasant female patient who presents with the following:  She is here today for follow-up; she had an MRI today of her lumbar spine per Dr. Renaye Rakersim Murphy.  They are trying to find out if her hip pain is actually due to her hip or to her back. She would like to have some pain medication to use as needed if possible  She has gained weight on wellbutrin.  She had been on celexa in the past as well as seroquel; we stopped this in November as her yponatremia/ SIADH thought due to her SSRI.  We did start her back on HCTZ which she was also on at that time.  So far her sodium has done well since she recovered from her acute illness.   She is having a very hard time sleeping at night.  Notes that her mood is not as stable, she is more tearful and easily upset.  She would like to go back on Celexa if she possibly can.  We restarted her HCTZ in January due to quite elevated BP (up to 170/100).    She has tolerated the low dose of HCTZ well and her sodium has done well She was drinking 4-5 beers per day back when she had the hyponatremia.  This could have contributed to her hyponatremia certainly  BP Readings from Last 3 Encounters:  09/10/14 140/90  07/19/14 170/100  06/07/14 160/100     Patient Active Problem List   Diagnosis Date Noted  . MDD (major depressive disorder) 10/06/2013  . Alcohol dependence 05/26/2013  . GAD (generalized anxiety disorder) 05/26/2013  . Depressive disorder 05/26/2013  . Benzodiazepine dependence 05/25/2013  . Alcohol dependence syndrome 02/15/2013  . Suicide attempt by multiple drug overdose 02/15/2013  . Substance induced mood disorder 02/15/2013  .  Nicotine dependence 02/15/2013  . COPD (chronic obstructive pulmonary disease) 02/15/2013  . Hyponatremia 07/05/2012  . Nicotine addiction 09/02/2011  . Hypertension 07/12/2011  . 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 Hypertension   . Depression   . Suicide attempt     x 3  . Osteoarthritis     right hip  . Fx clavicle     Hx of left clavicle fx  . Hx: UTI (urinary tract infection)     Past Surgical History  Procedure Laterality Date  . Orif left clavicle fracture and pinning 2nd metacarpal  01/03/2010    History  Substance Use Topics  . Smoking status: Current Every Day Smoker -- 1.50 packs/day for 44 years    Types: Cigarettes  . Smokeless tobacco: Never Used  . Alcohol Use: 0.6 oz/week    0 Shots of liquor, 1 Cans of beer per week     Comment: was drinking beer daily now down to total 1.5 cans a week, quit hard liquor 1 month ago    Family History  Problem Relation Age of Onset  . ADD / ADHD Neg Hx   . Anxiety disorder Neg Hx   . Alcohol abuse Neg Hx   . Bipolar disorder Neg  Hx   . Dementia Neg Hx   . Depression Neg Hx   . Drug abuse Neg Hx   . OCD Neg Hx   . Schizophrenia Neg Hx     Allergies  Allergen Reactions  . Celexa [Citalopram Hydrobromide]     Hyponatremia thought due to SSRI    Medication list has been reviewed and updated.  Current Outpatient Prescriptions on File Prior to Visit  Medication Sig Dispense Refill  . ALPRAZolam (XANAX) 1 MG tablet Take 1 tablet (1 mg total) by mouth 2 (two) times daily. May take an additional 0.5 mg once a day if needed 230 tablet 1  . amLODipine (NORVASC) 5 MG tablet Take 1 tablet (5 mg total) by mouth daily. 90 tablet 3  . hydrochlorothiazide (MICROZIDE) 12.5 MG capsule Take 1 capsule (12.5 mg total) by mouth daily. 90 capsule 1  . HYDROcodone-acetaminophen (NORCO/VICODIN) 5-325 MG per tablet Take 1 tablet by mouth every 8  (eight) hours as needed. 30 tablet 0  . Hyprom-Naphaz-Polysorb-Zn Sulf (CLEAR EYES COMPLETE OP) Place 1 drop into both eyes daily as needed (dry eyes).     Marland Kitchen lisinopril (PRINIVIL,ZESTRIL) 40 MG tablet TAKE 1 TABLET BY MOUTH EVERY DAY FOR BLOOD PRESSURE CONTROL 90 tablet 3  . albuterol (PROVENTIL HFA;VENTOLIN HFA) 108 (90 BASE) MCG/ACT inhaler Inhale 3 puffs into the lungs 2 (two) times daily as needed for wheezing or shortness of breath.    . furosemide (LASIX) 20 MG tablet Take 0.5 tablets (10 mg total) by mouth daily. (Patient not taking: Reported on 06/07/2014) 30 tablet 0  . ibuprofen (ADVIL,MOTRIN) 200 MG tablet Take 400 mg by mouth every 6 (six) hours as needed for moderate pain.    . VENTOLIN HFA 108 (90 BASE) MCG/ACT inhaler INHALE 2 PUFFS INTO THE LUNGS EVERY 6 (SIX) HOURS AS NEEDED FOR WHEEZING. (Patient not taking: Reported on 09/10/2014) 54 Inhaler 1   Current Facility-Administered Medications on File Prior to Visit  Medication Dose Route Frequency Provider Last Rate Last Dose  . acetaminophen (TYLENOL) tablet 975 mg  975 mg Oral Q6H PRN Pearline Cables, MD        Review of Systems:  As per HPI- otherwise negative.   Physical Examination: Filed Vitals:   09/10/14 1451  BP: 140/90  Pulse: 60  Temp: 98.2 F (36.8 C)  Resp: 16   Filed Vitals:   09/10/14 1451  Height: 5' 2.5" (1.588 m)  Weight: 161 lb (73.029 kg)   Body mass index is 28.96 kg/(m^2). Ideal Body Weight: Weight in (lb) to have BMI = 25: 138.6  GEN: WDWN, NAD, Non-toxic, A & O x 3 HEENT: Atraumatic, Normocephalic. Neck supple. No masses, No LAD. Ears and Nose: No external deformity. CV: RRR, No M/G/R. No JVD. No thrill. No extra heart sounds. PULM: CTA B, no wheezes, crackles, rhonchi. No retractions. No resp. distress. No accessory muscle use. EXTR: No c/c/e NEURO favoring her right hip PSYCH: Normally interactive. Conversant. Not depressed or anxious appearing.  Calm demeanor.  Looks well, but has  gained some weight  Wt Readings from Last 3 Encounters:  09/10/14 161 lb (73.029 kg)  07/19/14 162 lb (73.483 kg)  06/07/14 158 lb (71.668 kg)   05/05/2014: 152lbs 2013: 147  Assessment and Plan: Hyponatremia - Plan: Comprehensive metabolic panel  Depression  Abnormal weight gain  Right hip pain - Plan: HYDROcodone-acetaminophen (NORCO/VICODIN) 5-325 MG per tablet  Check her BMP today, but her sodium has done well recently.  Will discuss  possibility of starting back on celexa with the nephrologist who treated her while inpt, Dr. Allena Katz.  Assured her that if she cannot go back on SSRI we will make another plan.  In the meantime she will start tapering her wellbutrin, she will decrease to once a day Will plan to speak with Dr. Allena Katz next week  Signed Abbe Amsterdam, MD

## 2014-09-10 NOTE — Patient Instructions (Addendum)
I will give Dr. Allena KatzPatel a call on Monday to discuss starting you back on Celexa.  I will send you a mychart message about our conversation In the meantime cut your wellbutrin to once a day Use the hydrocodone as needed for hip pain (sparingly)- remember this is sedating We will also check your sodium level

## 2014-09-11 LAB — COMPREHENSIVE METABOLIC PANEL
ALBUMIN: 4.2 g/dL (ref 3.5–5.2)
ALT: 10 U/L (ref 0–35)
AST: 20 U/L (ref 0–37)
Alkaline Phosphatase: 89 U/L (ref 39–117)
BILIRUBIN TOTAL: 0.5 mg/dL (ref 0.2–1.2)
BUN: 11 mg/dL (ref 6–23)
CO2: 23 meq/L (ref 19–32)
Calcium: 9.3 mg/dL (ref 8.4–10.5)
Chloride: 98 mEq/L (ref 96–112)
Creat: 0.55 mg/dL (ref 0.50–1.10)
Glucose, Bld: 96 mg/dL (ref 70–99)
Potassium: 5 mEq/L (ref 3.5–5.3)
Sodium: 131 mEq/L — ABNORMAL LOW (ref 135–145)
TOTAL PROTEIN: 6.9 g/dL (ref 6.0–8.3)

## 2014-09-13 ENCOUNTER — Telehealth: Payer: Self-pay | Admitting: Family Medicine

## 2014-09-13 DIAGNOSIS — E871 Hypo-osmolality and hyponatremia: Secondary | ICD-10-CM

## 2014-09-13 DIAGNOSIS — F331 Major depressive disorder, recurrent, moderate: Secondary | ICD-10-CM

## 2014-09-13 MED ORDER — CITALOPRAM HYDROBROMIDE 10 MG PO TABS: ORAL_TABLET | ORAL | Status: DC

## 2014-09-13 NOTE — Telephone Encounter (Signed)
Discussed case with pt in detail. Touched base with Dr. Allena KatzPatel to get his advice.  We can cautiously try adding back celexa, but we will need to stop her HCTZ if we are going to do this.  If need need additional BP control can increase her amlodipine vs add lasix.  Will need to closely monitor her sodium level to watch for any problems.  She will DC her HCTZ and wellbutrin.  Start celexa at 10 mg, she will come by clinic towards the end of the week for a repeat BMP.  Assuming all going well we will increase to 40 of celexa fairly quickly and will add seroquel at night for sleep.    Meds ordered this encounter  Medications  . citalopram (CELEXA) 10 MG tablet    Sig: Increase to 20 mg a day as directed    Dispense:  60 tablet    Refill:  3

## 2014-09-29 ENCOUNTER — Other Ambulatory Visit (INDEPENDENT_AMBULATORY_CARE_PROVIDER_SITE_OTHER): Payer: Managed Care, Other (non HMO) | Admitting: *Deleted

## 2014-09-29 DIAGNOSIS — E871 Hypo-osmolality and hyponatremia: Secondary | ICD-10-CM

## 2014-09-29 LAB — BASIC METABOLIC PANEL
BUN: 11 mg/dL (ref 6–23)
CO2: 25 mEq/L (ref 19–32)
CREATININE: 0.64 mg/dL (ref 0.50–1.10)
Calcium: 9.3 mg/dL (ref 8.4–10.5)
Chloride: 101 mEq/L (ref 96–112)
GLUCOSE: 87 mg/dL (ref 70–99)
POTASSIUM: 4.8 meq/L (ref 3.5–5.3)
Sodium: 136 mEq/L (ref 135–145)

## 2014-10-02 ENCOUNTER — Other Ambulatory Visit: Payer: Self-pay | Admitting: Family Medicine

## 2014-10-04 NOTE — Telephone Encounter (Signed)
Dr Patsy Lageropland, do you want to give pt RFs or RTC?

## 2014-10-04 NOTE — Telephone Encounter (Signed)
Patient is upset we have not filled her request from Saturday.   Please call ASAP. [phone] (H)

## 2014-10-04 NOTE — Telephone Encounter (Signed)
Called her back- she states that she is using tramadol for her headache today and this is making her feel nauseated She is taking naproxen 500 mg twice a day for her back pain per her back doctor She states that she just started using the tramadol today, but I pointed out to her that she called 2 days ago for the phenergan (and I apologized for not getting back to her sooner about this). Brenda Rice sounded slightly intoxicated on the phone, which may be due to her just having woken up from a nap as she stated.  However I declined to refill phenergan for nausea and upset stomach without seeing her in the office.  I am in tomorrow and am glad to see her.  She stated understanding.

## 2014-11-16 ENCOUNTER — Other Ambulatory Visit: Payer: Self-pay | Admitting: Neurosurgery

## 2014-11-16 DIAGNOSIS — M5416 Radiculopathy, lumbar region: Secondary | ICD-10-CM

## 2014-11-19 ENCOUNTER — Encounter: Payer: Self-pay | Admitting: *Deleted

## 2014-11-24 ENCOUNTER — Ambulatory Visit (INDEPENDENT_AMBULATORY_CARE_PROVIDER_SITE_OTHER): Payer: Managed Care, Other (non HMO) | Admitting: Family Medicine

## 2014-11-24 VITALS — BP 150/85 | HR 90 | Temp 98.6°F | Resp 16 | Ht 62.25 in | Wt 161.0 lb

## 2014-11-24 DIAGNOSIS — R112 Nausea with vomiting, unspecified: Secondary | ICD-10-CM

## 2014-11-24 DIAGNOSIS — E871 Hypo-osmolality and hyponatremia: Secondary | ICD-10-CM

## 2014-11-24 DIAGNOSIS — G43901 Migraine, unspecified, not intractable, with status migrainosus: Secondary | ICD-10-CM

## 2014-11-24 LAB — POCT CBC
Granulocyte percent: 65 %G (ref 37–80)
HEMATOCRIT: 39 % (ref 37.7–47.9)
Hemoglobin: 13 g/dL (ref 12.2–16.2)
Lymph, poc: 2.5 (ref 0.6–3.4)
MCH, POC: 33.2 pg — AB (ref 27–31.2)
MCHC: 33.5 g/dL (ref 31.8–35.4)
MCV: 99 fL — AB (ref 80–97)
MID (CBC): 0.7 (ref 0–0.9)
MPV: 7.6 fL (ref 0–99.8)
POC Granulocyte: 6 (ref 2–6.9)
POC LYMPH %: 27.3 % (ref 10–50)
POC MID %: 7.7 %M (ref 0–12)
Platelet Count, POC: 174 10*3/uL (ref 142–424)
RBC: 3.94 M/uL — AB (ref 4.04–5.48)
RDW, POC: 13.6 %
WBC: 9.3 10*3/uL (ref 4.6–10.2)

## 2014-11-24 LAB — COMPREHENSIVE METABOLIC PANEL
ALBUMIN: 4.2 g/dL (ref 3.5–5.2)
ALT: 16 U/L (ref 0–35)
AST: 25 U/L (ref 0–37)
Alkaline Phosphatase: 79 U/L (ref 39–117)
BUN: 6 mg/dL (ref 6–23)
CO2: 27 meq/L (ref 19–32)
Calcium: 8.9 mg/dL (ref 8.4–10.5)
Chloride: 85 mEq/L — ABNORMAL LOW (ref 96–112)
Creat: 0.54 mg/dL (ref 0.50–1.10)
GLUCOSE: 94 mg/dL (ref 70–99)
Potassium: 3.8 mEq/L (ref 3.5–5.3)
Sodium: 124 mEq/L — ABNORMAL LOW (ref 135–145)
Total Bilirubin: 1.1 mg/dL (ref 0.2–1.2)
Total Protein: 6.5 g/dL (ref 6.0–8.3)

## 2014-11-24 MED ORDER — PROMETHAZINE HCL 25 MG PO TABS: 25.0000 mg | ORAL_TABLET | Freq: Three times a day (TID) | ORAL | Status: DC | PRN

## 2014-11-24 MED ORDER — PROMETHAZINE HCL 25 MG/ML IJ SOLN
25.0000 mg | Freq: Once | INTRAMUSCULAR | Status: AC
  Administered 2014-11-24: 25 mg via INTRAMUSCULAR

## 2014-11-24 NOTE — Progress Notes (Addendum)
[facility] [address], [place] [address] [phone]  Date:  11/24/2014   Name:  Brenda Rice   DOB:  [birth year]   MRN:  [id number]  PCP:  Abbe Amsterdam, MD    Chief Complaint: Migraine; Diarrhea; Abdominal Pain; Nausea; and Emesis   History of Present Illness:  Brenda Rice is a 59 y.o. very pleasant female patient who presents with the following:  Here today with illness. Today is Wednesday. She has noted a migraine HA since Sunday.  No aura. She has tried some pain pills that she had left over- finished these yesterday.  However they did not seem to be helping anyway.   She also notes epigastric pain, watery diarrhea, and vomiting for the past 3 days or so.  She is only able to tolerate water.  She threw up soup last night.   She has not had any fever.    This am she took 2 excedrin migraine.  Her HA started out as a typical migraine- however she does not generally have these GI sx with a migraine.   She did take naproxen today- took this am Her husband is here to drive her home today She also needs to be excused from work this week- she did not go Monday, Tuesday or today and would like to be out until this coming Monday if possible to rest Patient Active Problem List   Diagnosis Date Noted  . MDD (major depressive disorder) 10/06/2013  . Alcohol dependence 05/26/2013  . GAD (generalized anxiety disorder) 05/26/2013  . Depressive disorder 05/26/2013  . Benzodiazepine dependence 05/25/2013  . Alcohol dependence syndrome 02/15/2013  . Suicide attempt by multiple drug overdose 02/15/2013  . Substance induced mood disorder 02/15/2013  . Nicotine dependence 02/15/2013  . COPD (chronic obstructive pulmonary disease) 02/15/2013  . Hyponatremia 07/05/2012  . Nicotine addiction 09/02/2011  . Hypertension 07/12/2011  . 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  Hypertension   . Depression   . Suicide attempt     x 3  . Osteoarthritis     right hip  . Fx clavicle     Hx of left clavicle fx  . Hx: UTI (urinary tract infection)     Past Surgical History  Procedure Laterality Date  . Orif left clavicle fracture and pinning 2nd metacarpal  01/03/2010    History  Substance Use Topics  . Smoking status: Current Every Day Smoker -- 1.50 packs/day for 44 years    Types: Cigarettes  . Smokeless tobacco: Never Used  . Alcohol Use: 0.6 oz/week    0 Shots of liquor, 1 Cans of beer per week     Comment: was drinking beer daily now down to total 1.5 cans a week, quit hard liquor 1 month ago    Family History  Problem Relation Age of Onset  . ADD / ADHD Neg Hx   . Anxiety disorder Neg Hx   . Alcohol abuse Neg Hx   . Bipolar disorder Neg Hx   . Dementia Neg Hx   . Depression Neg Hx   . Drug abuse Neg Hx   . OCD Neg Hx   . Schizophrenia Neg Hx     Allergies  Allergen Reactions  . Celexa [Citalopram Hydrobromide]     Hyponatremia thought due to SSRI    Medication list has been reviewed and updated.  Current Outpatient Prescriptions  on File Prior to Visit  Medication Sig Dispense Refill  . albuterol (PROVENTIL HFA;VENTOLIN HFA) 108 (90 BASE) MCG/ACT inhaler Inhale 3 puffs into the lungs 2 (two) times daily as needed for wheezing or shortness of breath.    . ALPRAZolam (XANAX) 1 MG tablet Take 1 tablet (1 mg total) by mouth 2 (two) times daily. May take an additional 0.5 mg once a day if needed 230 tablet 1  . citalopram (CELEXA) 10 MG tablet Increase to 20 mg a day as directed 60 tablet 3  . ibuprofen (ADVIL,MOTRIN) 200 MG tablet Take 400 mg by mouth every 6 (six) hours as needed for moderate pain.    Marland Kitchen lisinopril (PRINIVIL,ZESTRIL) 40 MG tablet TAKE 1 TABLET BY MOUTH EVERY DAY FOR BLOOD PRESSURE CONTROL 90 tablet 3  . amLODipine (NORVASC) 5 MG tablet Take 1 tablet (5 mg total) by mouth daily. (Patient not taking: Reported on 11/24/2014) 90  tablet 3  . HYDROcodone-acetaminophen (NORCO/VICODIN) 5-325 MG per tablet Take 1 tablet by mouth every 8 (eight) hours as needed. (Patient not taking: Reported on 11/24/2014) 30 tablet 0  . Hyprom-Naphaz-Polysorb-Zn Sulf (CLEAR EYES COMPLETE OP) Place 1 drop into both eyes daily as needed (dry eyes).     . VENTOLIN HFA 108 (90 BASE) MCG/ACT inhaler INHALE 2 PUFFS INTO THE LUNGS EVERY 6 (SIX) HOURS AS NEEDED FOR WHEEZING. (Patient not taking: Reported on 09/10/2014) 54 Inhaler 1   Current Facility-Administered Medications on File Prior to Visit  Medication Dose Route Frequency Provider Last Rate Last Dose  . acetaminophen (TYLENOL) tablet 975 mg  975 mg Oral Q6H PRN Pearline Cables, MD        Review of Systems:  As per HPI- otherwise negative.   Physical Examination: Filed Vitals:   11/24/14 1338  BP: 150/82  Pulse: 90  Temp: 98.6 F (37 C)  Resp: 16   Filed Vitals:   11/24/14 1338  Height: 5' 2.25" (1.581 m)  Weight: 161 lb (73.029 kg)   Body mass index is 29.22 kg/(m^2). Ideal Body Weight: Weight in (lb) to have BMI = 25: 137.5  GEN: WDWN, NAD, Non-toxic, A & O x 3, overweight, looks well and as her usual self HEENT: Atraumatic, Normocephalic. Neck supple. No masses, No LAD.  Bilateral TM wnl, oropharynx normal.  PEERL,EOMI.   Ears and Nose: No external deformity. CV: RRR, No M/G/R. No JVD. No thrill. No extra heart sounds. PULM: CTA B, no wheezes, crackles, rhonchi. No retractions. No resp. distress. No accessory muscle use. ABD: S, NT, ND, +BS. No rebound. No HSM.  She indicates the epigastric area as the site of her sx but no tenderness now EXTR: No c/c/e NEURO Normal gait. Normal strength, sensation and DTR of all extremities PSYCH: Normally interactive. Conversant. Not depressed or anxious appearing.  Calm demeanor.   Started IV for hydration Phenergan IM once, 25 mg She did feel better after fluids, stated that her HA was nearly gone and wished to go home.  Declined  further imaging or evaluation  Results for orders placed or performed in visit on 11/24/14  POCT CBC  Result Value Ref Range   WBC 9.3 4.6 - 10.2 K/uL   Lymph, poc 2.5 0.6 - 3.4   POC LYMPH PERCENT 27.3 10 - 50 %L   MID (cbc) 0.7 0 - 0.9   POC MID % 7.7 0 - 12 %M   POC Granulocyte 6.0 2 - 6.9   Granulocyte percent 65.0 37 - 80 %G  RBC 3.94 (A) 4.04 - 5.48 M/uL   Hemoglobin 13.0 12.2 - 16.2 g/dL   HCT, POC 40.939.0 81.137.7 - 47.9 %   MCV 99.0 (A) 80 - 97 fL   MCH, POC 33.2 (A) 27 - 31.2 pg   MCHC 33.5 31.8 - 35.4 g/dL   RDW, POC 91.413.6 %   Platelet Count, POC 174 142 - 424 K/uL   MPV 7.6 0 - 99.8 fL    Assessment and Plan: Migraine with status migrainosus, not intractable, unspecified migraine type - Plan: POCT CBC, Comprehensive metabolic panel  Non-intractable vomiting with nausea, vomiting of unspecified type - Plan: promethazine (PHENERGAN) injection 25 mg, promethazine (PHENERGAN) 25 MG tablet  Treated with IV fluids and phenergan. She is feeling better.  Plans to go home to rest.   Will plan to have her rest for the next 2 days at home, use phenergan PO as needed  Return to care if sx worsen again or fail to completely remit Also check CMP as she had an episode of hyponatremia a year ago which presented with HA  Signed Abbe AmsterdamJessica Alexandra Lipps, MD  Seen here today with a migraine headache which kept her out of work Monday 5/30, Tuesday 5/31, and today 6/1.  She will also be out of work tomorrow and Friday, 6/2 and 6/3. She plans to return to work on Monday.  Fax to [employer] at [phone]  Called on 6/2 early; her sodium is low again. She is overall doing better. She admits that she did drink a few beers over the weekend- maybe 3 Thursday and Friday. Offered to have her go to the ER now or [facility] for risk stratification.   She will come in asap for a stat BMP; if getting worse will need to be admitted and we may need to stop her celexa again.  She agrees with plan. She  will stop drinking beer.  She later called me back because she wanted to admit that she had been drinking several beers a day on a regular basis recently.  Called pt at 7:30 pm on 6/2; she did not get her blood drawn today. She states that she feels ok and will get her blood drawn tomorrow am without fail

## 2014-11-25 NOTE — Addendum Note (Signed)
Addended by: Abbe AmsterdamOPLAND, Ellowyn Rieves C on: 11/25/2014 02:27 PM   Modules accepted: Orders

## 2014-11-25 NOTE — Addendum Note (Signed)
Addended by: Abbe AmsterdamOPLAND, Daejah Klebba C on: 11/25/2014 08:11 AM   Modules accepted: Orders

## 2014-11-26 ENCOUNTER — Other Ambulatory Visit: Payer: Managed Care, Other (non HMO)

## 2014-11-26 ENCOUNTER — Other Ambulatory Visit: Payer: Self-pay

## 2014-11-26 DIAGNOSIS — E871 Hypo-osmolality and hyponatremia: Secondary | ICD-10-CM

## 2014-11-26 LAB — BASIC METABOLIC PANEL
BUN: 9 mg/dL (ref 6–23)
CHLORIDE: 93 meq/L — AB (ref 96–112)
CO2: 28 mEq/L (ref 19–32)
Calcium: 8.8 mg/dL (ref 8.4–10.5)
Creat: 0.73 mg/dL (ref 0.50–1.10)
Glucose, Bld: 96 mg/dL (ref 70–99)
POTASSIUM: 3.5 meq/L (ref 3.5–5.3)
Sodium: 132 mEq/L — ABNORMAL LOW (ref 135–145)

## 2014-11-26 NOTE — Progress Notes (Signed)
Patient here today for lab only. BMP drawn per Dr. Patsy Lageropland

## 2014-12-01 ENCOUNTER — Other Ambulatory Visit: Payer: Self-pay

## 2014-12-10 ENCOUNTER — Ambulatory Visit
Admission: RE | Admit: 2014-12-10 | Discharge: 2014-12-10 | Disposition: A | Discharge status: Home / Self Care | Payer: Managed Care, Other (non HMO) | Source: Ambulatory Visit | Attending: Neurosurgery | Admitting: Neurosurgery

## 2014-12-10 DIAGNOSIS — M5416 Radiculopathy, lumbar region: Secondary | ICD-10-CM

## 2014-12-10 MED ORDER — IOHEXOL 180 MG/ML  SOLN: 15.0000 mL | Freq: Once | INTRAMUSCULAR | Status: AC | PRN

## 2014-12-10 NOTE — Progress Notes (Signed)
Pt states she has been off Trazodone and Phenergan for the past 2 days. Discharge instructions explained to pt.

## 2014-12-10 NOTE — Discharge Instructions (Signed)
Myelogram Discharge Instructions  1. Go home and rest quietly for the next 24 hours.  It is important to lie flat for the next 24 hours.  Get up only to go to the restroom.  You may lie in the bed or on a couch on your back, your stomach, your left side or your right side.  You may have one pillow under your head.  You may have pillows between your knees while you are on your side or under your knees while you are on your back.  2. DO NOT drive today.  Recline the seat as far back as it will go, while still wearing your seat belt, on the way home.  3. You may get up to go to the bathroom as needed.  You may sit up for 10 minutes to eat.  You may resume your normal diet and medications unless otherwise indicated.  Drink lots of extra fluids today and tomorrow.  4. The incidence of headache, nausea, or vomiting is about 5% (one in 20 patients).  If you develop a headache, lie flat and drink plenty of fluids until the headache goes away.  Caffeinated beverages may be helpful.  If you develop severe nausea and vomiting or a headache that does not go away with flat bed rest, call [phone].  5. You may resume normal activities after your 24 hours of bed rest is over; however, do not exert yourself strongly or do any heavy lifting tomorrow. If when you get up you have a headache when standing, go back to bed and force fluids for another 24 hours.  6. Call your physician for a follow-up appointment.  The results of your myelogram will be sent directly to your physician by the following day.  7. If you have any questions or if complications develop after you arrive home, please call [phone].  Discharge instructions have been explained to the patient.  The patient, or the person responsible for the patient, fully understands these instructions.       May resume Trazodone and Phenergan on June 18,2016, after 1:00 pm.

## 2014-12-15 ENCOUNTER — Encounter: Payer: Self-pay | Admitting: *Deleted

## 2014-12-26 ENCOUNTER — Encounter: Payer: Self-pay | Admitting: Family Medicine

## 2015-01-04 ENCOUNTER — Encounter: Payer: Self-pay | Admitting: Family Medicine

## 2015-01-05 ENCOUNTER — Emergency Department (HOSPITAL_COMMUNITY)
Admission: EM | Admit: 2015-01-05 | Discharge: 2015-01-05 | Disposition: A | Discharge status: Home / Self Care | Payer: Managed Care, Other (non HMO) | Attending: Emergency Medicine | Admitting: Emergency Medicine

## 2015-01-05 ENCOUNTER — Emergency Department (HOSPITAL_COMMUNITY): Payer: Managed Care, Other (non HMO)

## 2015-01-05 ENCOUNTER — Encounter (HOSPITAL_COMMUNITY): Payer: Self-pay | Admitting: *Deleted

## 2015-01-05 DIAGNOSIS — R197 Diarrhea, unspecified: Secondary | ICD-10-CM | POA: Insufficient documentation

## 2015-01-05 DIAGNOSIS — Z8781 Personal history of (healed) traumatic fracture: Secondary | ICD-10-CM | POA: Diagnosis not present

## 2015-01-05 DIAGNOSIS — G8929 Other chronic pain: Secondary | ICD-10-CM | POA: Insufficient documentation

## 2015-01-05 DIAGNOSIS — Z791 Long term (current) use of non-steroidal anti-inflammatories (NSAID): Secondary | ICD-10-CM | POA: Diagnosis not present

## 2015-01-05 DIAGNOSIS — M1990 Unspecified osteoarthritis, unspecified site: Secondary | ICD-10-CM | POA: Insufficient documentation

## 2015-01-05 DIAGNOSIS — R1013 Epigastric pain: Secondary | ICD-10-CM | POA: Insufficient documentation

## 2015-01-05 DIAGNOSIS — F329 Major depressive disorder, single episode, unspecified: Secondary | ICD-10-CM | POA: Diagnosis not present

## 2015-01-05 DIAGNOSIS — R112 Nausea with vomiting, unspecified: Secondary | ICD-10-CM | POA: Diagnosis not present

## 2015-01-05 DIAGNOSIS — R079 Chest pain, unspecified: Secondary | ICD-10-CM | POA: Insufficient documentation

## 2015-01-05 DIAGNOSIS — Z87440 Personal history of urinary (tract) infections: Secondary | ICD-10-CM | POA: Diagnosis not present

## 2015-01-05 DIAGNOSIS — R05 Cough: Secondary | ICD-10-CM | POA: Diagnosis not present

## 2015-01-05 DIAGNOSIS — Z79899 Other long term (current) drug therapy: Secondary | ICD-10-CM | POA: Diagnosis not present

## 2015-01-05 DIAGNOSIS — Z720 Tobacco use: Secondary | ICD-10-CM | POA: Insufficient documentation

## 2015-01-05 DIAGNOSIS — M549 Dorsalgia, unspecified: Secondary | ICD-10-CM | POA: Diagnosis not present

## 2015-01-05 DIAGNOSIS — I10 Essential (primary) hypertension: Secondary | ICD-10-CM | POA: Insufficient documentation

## 2015-01-05 DIAGNOSIS — R1010 Upper abdominal pain, unspecified: Secondary | ICD-10-CM

## 2015-01-05 DIAGNOSIS — R61 Generalized hyperhidrosis: Secondary | ICD-10-CM | POA: Diagnosis not present

## 2015-01-05 LAB — URINALYSIS, ROUTINE W REFLEX MICROSCOPIC
BILIRUBIN URINE: NEGATIVE
Glucose, UA: NEGATIVE mg/dL
Hgb urine dipstick: NEGATIVE
KETONES UR: NEGATIVE mg/dL
Nitrite: NEGATIVE
PH: 8 (ref 5.0–8.0)
Protein, ur: NEGATIVE mg/dL
SPECIFIC GRAVITY, URINE: 1.006 (ref 1.005–1.030)
UROBILINOGEN UA: 0.2 mg/dL (ref 0.0–1.0)

## 2015-01-05 LAB — CBC WITH DIFFERENTIAL/PLATELET
BASOS ABS: 0 10*3/uL (ref 0.0–0.1)
BASOS PCT: 0 % (ref 0–1)
Eosinophils Absolute: 0.1 10*3/uL (ref 0.0–0.7)
Eosinophils Relative: 1 % (ref 0–5)
HEMATOCRIT: 40.9 % (ref 36.0–46.0)
Hemoglobin: 13.9 g/dL (ref 12.0–15.0)
LYMPHS PCT: 20 % (ref 12–46)
Lymphs Abs: 1.5 10*3/uL (ref 0.7–4.0)
MCH: 33.2 pg (ref 26.0–34.0)
MCHC: 34 g/dL (ref 30.0–36.0)
MCV: 97.6 fL (ref 78.0–100.0)
MONOS PCT: 13 % — AB (ref 3–12)
Monocytes Absolute: 0.9 10*3/uL (ref 0.1–1.0)
Neutro Abs: 5 10*3/uL (ref 1.7–7.7)
Neutrophils Relative %: 66 % (ref 43–77)
Platelets: 145 10*3/uL — ABNORMAL LOW (ref 150–400)
RBC: 4.19 MIL/uL (ref 3.87–5.11)
RDW: 13.2 % (ref 11.5–15.5)
WBC: 7.5 10*3/uL (ref 4.0–10.5)

## 2015-01-05 LAB — COMPREHENSIVE METABOLIC PANEL
ALT: 20 U/L (ref 14–54)
AST: 30 U/L (ref 15–41)
Albumin: 4.1 g/dL (ref 3.5–5.0)
Alkaline Phosphatase: 91 U/L (ref 38–126)
Anion gap: 10 (ref 5–15)
BUN: 6 mg/dL (ref 6–20)
CO2: 27 mmol/L (ref 22–32)
Calcium: 9.2 mg/dL (ref 8.9–10.3)
Chloride: 97 mmol/L — ABNORMAL LOW (ref 101–111)
Creatinine, Ser: 0.55 mg/dL (ref 0.44–1.00)
Glucose, Bld: 104 mg/dL — ABNORMAL HIGH (ref 65–99)
Potassium: 3.5 mmol/L (ref 3.5–5.1)
Sodium: 134 mmol/L — ABNORMAL LOW (ref 135–145)
TOTAL PROTEIN: 7.1 g/dL (ref 6.5–8.1)
Total Bilirubin: 1 mg/dL (ref 0.3–1.2)

## 2015-01-05 LAB — URINE MICROSCOPIC-ADD ON

## 2015-01-05 LAB — LIPASE, BLOOD

## 2015-01-05 LAB — TROPONIN I: Troponin I: <0.03 ng/mL (ref <0.031)

## 2015-01-05 MED ORDER — HYDROCODONE-ACETAMINOPHEN 5-325 MG PO TABS: 1.0000 | ORAL_TABLET | Freq: Four times a day (QID) | ORAL | Status: DC | PRN

## 2015-01-05 MED ORDER — HYDROMORPHONE HCL 1 MG/ML IJ SOLN
1.0000 mg | Freq: Once | INTRAMUSCULAR | Status: AC
  Administered 2015-01-05: 1 mg via INTRAVENOUS
  Filled 2015-01-05: qty 1

## 2015-01-05 MED ORDER — SODIUM CHLORIDE 0.9 % IV BOLUS (SEPSIS)
1000.0000 mL | Freq: Once | INTRAVENOUS | Status: AC
  Administered 2015-01-05: 1000 mL via INTRAVENOUS

## 2015-01-05 MED ORDER — ONDANSETRON 4 MG PO TBDP
ORAL_TABLET | ORAL | Status: DC
Start: 2015-01-05 — End: 2015-02-28

## 2015-01-05 MED ORDER — ONDANSETRON HCL 4 MG/2ML IJ SOLN
4.0000 mg | Freq: Once | INTRAMUSCULAR | Status: AC
  Administered 2015-01-05: 4 mg via INTRAVENOUS
  Filled 2015-01-05: qty 2

## 2015-01-05 NOTE — ED Notes (Signed)
Patient transported to X-ray 

## 2015-01-05 NOTE — ED Provider Notes (Signed)
CSN: [id number]     Arrival date & time 01/05/15  0636 History   First MD Initiated Contact with Patient 01/05/15 [phone]     Chief Complaint  Patient presents with  . Chest Pain     (Consider location/radiation/quality/duration/timing/severity/associated sxs/prior Treatment) HPI  59 year old female presents with chest pain that started around 1 AM. For the past 3 days the patient has been having intermittent nausea, vomiting, and diarrhea. This morning at around 1 AM she had vomiting and then chest pain. No hematemesis. Has been having epigastric abdominal pain as well started before the chest pain. Vomiting makes the chest pain worse. Has a chronic cough that is not worse today. Does hurt worse to breathe but no shortness of breath. No urinary symptoms. Patient has been unable to keep down any fluids. Had cold sweats and chills last night but has not noted a fever. Rates her pain as severe.  Past Medical History  Diagnosis Date  . Hypertension   . Depression   . Suicide attempt     x 3  . Osteoarthritis     right hip  . Fx clavicle     Hx of left clavicle fx  . Hx: UTI (urinary tract infection)    Past Surgical History  Procedure Laterality Date  . Orif left clavicle fracture and pinning 2nd metacarpal  01/03/2010   Family History  Problem Relation Age of Onset  . ADD / ADHD Neg Hx   . Anxiety disorder Neg Hx   . Alcohol abuse Neg Hx   . Bipolar disorder Neg Hx   . Dementia Neg Hx   . Depression Neg Hx   . Drug abuse Neg Hx   . OCD Neg Hx   . Schizophrenia Neg Hx    History  Substance Use Topics  . Smoking status: Current Every Day Smoker -- 1.50 packs/day for 44 years    Types: Cigarettes  . Smokeless tobacco: Never Used  . Alcohol Use: 0.6 oz/week    0 Shots of liquor, 1 Cans of beer per week     Comment: was drinking beer daily now down to total 1.5 cans a week, quit hard liquor 1 month ago   OB History    No data available     Review of Systems   Constitutional: Positive for diaphoresis. Negative for fever.  Respiratory: Positive for cough. Negative for shortness of breath.   Cardiovascular: Positive for chest pain.  Gastrointestinal: Positive for nausea, vomiting, abdominal pain and diarrhea. Negative for blood in stool.  Genitourinary: Negative for dysuria.  Musculoskeletal: Positive for back pain (chronic).  All other systems reviewed and are negative.     Allergies  Celexa  Home Medications   Prior to Admission medications   Medication Sig Start Date End Date Taking? Authorizing Provider  albuterol (PROVENTIL HFA;VENTOLIN HFA) 108 (90 BASE) MCG/ACT inhaler Inhale 3 puffs into the lungs 2 (two) times daily as needed for wheezing or shortness of breath.    Historical Provider, MD  ALPRAZolam Prudy Feeler) 1 MG tablet Take 1 tablet (1 mg total) by mouth 2 (two) times daily. May take an additional 0.5 mg once a day if needed 07/19/14   Gwenlyn Found Copland, MD  HYDROcodone-acetaminophen (NORCO) 10-325 MG per tablet TAKE 1 TABLET BY MOUTH EVERY 4 TO 6 HOURS AS NEEDED FOR PAIN 11/16/14   Historical Provider, MD  Hyprom-Naphaz-Polysorb-Zn Sulf (CLEAR EYES COMPLETE OP) Place 1 drop into both eyes daily as needed (dry eyes).  Historical Provider, MD  ibuprofen (ADVIL,MOTRIN) 200 MG tablet Take 400 mg by mouth every 6 (six) hours as needed for moderate pain.    Historical Provider, MD  lisinopril (PRINIVIL,ZESTRIL) 40 MG tablet TAKE 1 TABLET BY MOUTH EVERY DAY FOR BLOOD PRESSURE CONTROL 07/19/14   Gwenlyn Found Copland, MD  naproxen (NAPROSYN) 500 MG tablet Take 500 mg by mouth 2 (two) times daily with a meal.    Historical Provider, MD  oxyCODONE-acetaminophen (PERCOCET/ROXICET) 5-325 MG per tablet TAKE 1 TABLET EVERY 4 TO 6 HOURS AS NEEDED FOR PAIN 11/05/14   Historical Provider, MD  promethazine (PHENERGAN) 25 MG tablet Take 1 tablet (25 mg total) by mouth every 8 (eight) hours as needed for nausea or vomiting. 11/24/14   Gwenlyn Found Copland, MD   tiZANidine (ZANAFLEX) 4 MG tablet TAKE 0.5-1 TABLET(S) BY MOUTH 3 TIMES A DAY AS NEEDED [PRN] SPASMS 11/08/14   Historical Provider, MD  VENTOLIN HFA 108 (90 BASE) MCG/ACT inhaler INHALE 2 PUFFS INTO THE LUNGS EVERY 6 (SIX) HOURS AS NEEDED FOR WHEEZING. Patient not taking: Reported on 09/10/2014 08/11/14   Gwenlyn Found Copland, MD   BP 173/94 mmHg  Pulse 99  Temp(Src) 98.1 F (36.7 C) (Oral)  Resp 18  Ht 5\' 2"  (1.575 m)  Wt 160 lb (72.576 kg)  BMI 29.26 kg/m2  SpO2 95% Physical Exam  Constitutional: She is oriented to person, place, and time. She appears well-developed and well-nourished.  HENT:  Head: Normocephalic and atraumatic.  Right Ear: External ear normal.  Left Ear: External ear normal.  Nose: Nose normal.  Eyes: Right eye exhibits no discharge. Left eye exhibits no discharge.  Neck: Neck supple.  Cardiovascular: Normal rate, regular rhythm and normal heart sounds.   Pulmonary/Chest: Effort normal and breath sounds normal. She exhibits tenderness (Mid chest tenderness. No crepitus).  Abdominal: Soft. She exhibits no distension. There is tenderness in the epigastric area.  Musculoskeletal: She exhibits no edema.  Neurological: She is alert and oriented to person, place, and time.  Skin: Skin is warm and dry.  Nursing note and vitals reviewed.   ED Course  Procedures (including critical care time) Labs Review Labs Reviewed  COMPREHENSIVE METABOLIC PANEL - Abnormal; Notable for the following:    Sodium 134 (*)    Chloride 97 (*)    Glucose, Bld 104 (*)    All other components within normal limits  LIPASE, BLOOD - Abnormal; Notable for the following:    Lipase <10 (*)    All other components within normal limits  CBC WITH DIFFERENTIAL/PLATELET - Abnormal; Notable for the following:    Platelets 145 (*)    Monocytes Relative 13 (*)    All other components within normal limits  URINALYSIS, ROUTINE W REFLEX MICROSCOPIC (NOT AT [facility]) - Abnormal; Notable for the following:     Leukocytes, UA TRACE (*)    All other components within normal limits  URINE MICROSCOPIC-ADD ON - Abnormal; Notable for the following:    Squamous Epithelial / LPF FEW (*)    All other components within normal limits  TROPONIN I  TROPONIN I    Imaging Review Dg Chest 2 View  01/05/2015   CLINICAL DATA:  Chest pain.  Prior vomiting.  EXAM: CHEST  2 VIEW  COMPARISON:  03/10/2014  FINDINGS: Old healed bilateral rib fractures. Old left distal clavicular fracture deformity.  Mild atherosclerotic calcification of the aortic arch. The lungs appear clear. Heart size within normal limits.  IMPRESSION: 1. No pneumothorax or  pneumomediastinum identified. 2. Mild atheromatous calcification of the aortic arch. 3. Old healed fractures.   Electronically Signed   By: Gaylyn RongWalter  Liebkemann M.D.   On: 01/05/2015 07:34     EKG Interpretation   Date/Time:  Wednesday January 05 2015 06:40:22 EDT Ventricular Rate:  96 PR Interval:  142 QRS Duration: 90 QT Interval:  349 QTC Calculation: 441 R Axis:   60 Text Interpretation:  Sinus rhythm Probable anteroseptal infarct, old No  significant change since last tracing in nov 2015 Confirmed by WARD,  DO,  KRISTEN ([id number]) on 01/05/2015 6:43:59 AM      MDM   Final diagnoses:  Upper abdominal pain  Nonspecific chest pain    Patient's chest pain is most likely from repeated vomiting. She has no hematemesis and appears well and has no crepitus. With a negative chest x-ray have low suspicion for esophageal pathology. Most likely she has been having gastroenteritis with her vomiting and diarrhea and with the amount of vomiting she's having she's having more abdominal pain and chest pain. Do not feel she needs emergent CT imaging at this time for her abdomen. EKG is unremarkable. Her CP is unlikely to be ACS, with negative EKG and 2 negative troponins I feel she is stable for f/u as outpatient. I highly doubt PE with the GI symptoms. No signs of UTI.    Pricilla LovelessScott  Alder Murri, MD 01/05/15 [phone]

## 2015-01-05 NOTE — ED Notes (Signed)
Pt states the past 2 days she's had n/v, states around 1 am started having centralized chest pains, states it's sharp at times then dull pain, pt states also feels short of breath/dizzy

## 2015-01-05 NOTE — Discharge Instructions (Signed)
Abdominal Pain Many things can cause abdominal pain. Usually, abdominal pain is not caused by a disease and will improve without treatment. It can often be observed and treated at home. Your health care provider will do a physical exam and possibly order blood tests and X-rays to help determine the seriousness of your pain. However, in many cases, more time must pass before a clear cause of the pain can be found. Before that point, your health care provider may not know if you need more testing or further treatment. HOME CARE INSTRUCTIONS  Monitor your abdominal pain for any changes. The following actions may help to alleviate any discomfort you are experiencing:  Only take over-the-counter or prescription medicines as directed by your health care provider.  Do not take laxatives unless directed to do so by your health care provider.  Try a clear liquid diet (broth, tea, or water) as directed by your health care provider. Slowly move to a bland diet as tolerated. SEEK MEDICAL CARE IF:  You have unexplained abdominal pain.  You have abdominal pain associated with nausea or diarrhea.  You have pain when you urinate or have a bowel movement.  You experience abdominal pain that wakes you in the night.  You have abdominal pain that is worsened or improved by eating food.  You have abdominal pain that is worsened with eating fatty foods.  You have a fever. SEEK IMMEDIATE MEDICAL CARE IF:   Your pain does not go away within 2 hours.  You keep throwing up (vomiting).  Your pain is felt only in portions of the abdomen, such as the right side or the left lower portion of the abdomen.  You pass bloody or black tarry stools. MAKE SURE YOU:  Understand these instructions.   Will watch your condition.   Will get help right away if you are not doing well or get worse.  Document Released: 03/21/2005 Document Revised: 06/16/2013 Document Reviewed: 02/18/2013 [facility] Patient Information  2015 [place], [place]. This information is not intended to replace advice given to you by your health care provider. Make sure you discuss any questions you have with your health care provider.     Chest Pain (Nonspecific) It is often hard to give a specific diagnosis for the cause of chest pain. There is always a chance that your pain could be related to something serious, such as a heart attack or a blood clot in the lungs. You need to follow up with your health care provider for further evaluation. CAUSES   Heartburn.  Pneumonia or bronchitis.  Anxiety or stress.  Inflammation around your heart (pericarditis) or lung (pleuritis or pleurisy).  A blood clot in the lung.  A collapsed lung (pneumothorax). It can develop suddenly on its own (spontaneous pneumothorax) or from trauma to the chest.  Shingles infection (herpes zoster virus). The chest wall is composed of bones, muscles, and cartilage. Any of these can be the source of the pain.  The bones can be bruised by injury.  The muscles or cartilage can be strained by coughing or overwork.  The cartilage can be affected by inflammation and become sore (costochondritis). DIAGNOSIS  Lab tests or other studies may be needed to find the cause of your pain. Your health care provider may have you take a test called an ambulatory electrocardiogram (ECG). An ECG records your heartbeat patterns over a 24-hour period. You may also have other tests, such as:  Transthoracic echocardiogram (TTE). During echocardiography, sound waves are used to  evaluate how blood flows through your heart.  Transesophageal echocardiogram (TEE).  Cardiac monitoring. This allows your health care provider to monitor your heart rate and rhythm in real time.  Holter monitor. This is a portable device that records your heartbeat and can help diagnose heart arrhythmias. It allows your health care provider to track your heart activity for several days, if  needed.  Stress tests by exercise or by giving medicine that makes the heart beat faster. TREATMENT   Treatment depends on what may be causing your chest pain. Treatment may include:  Acid blockers for heartburn.  Anti-inflammatory medicine.  Pain medicine for inflammatory conditions.  Antibiotics if an infection is present.  You may be advised to change lifestyle habits. This includes stopping smoking and avoiding alcohol, caffeine, and chocolate.  You may be advised to keep your head raised (elevated) when sleeping. This reduces the chance of acid going backward from your stomach into your esophagus. Most of the time, nonspecific chest pain will improve within 2-3 days with rest and mild pain medicine.  HOME CARE INSTRUCTIONS   If antibiotics were prescribed, take them as directed. Finish them even if you start to feel better.  For the next few days, avoid physical activities that bring on chest pain. Continue physical activities as directed.  Do not use any tobacco products, including cigarettes, chewing tobacco, or electronic cigarettes.  Avoid drinking alcohol.  Only take medicine as directed by your health care provider.  Follow your health care provider's suggestions for further testing if your chest pain does not go away.  Keep any follow-up appointments you made. If you do not go to an appointment, you could develop lasting (chronic) problems with pain. If there is any problem keeping an appointment, call to reschedule. SEEK MEDICAL CARE IF:   Your chest pain does not go away, even after treatment.  You have a rash with blisters on your chest.  You have a fever. SEEK IMMEDIATE MEDICAL CARE IF:   You have increased chest pain or pain that spreads to your arm, neck, jaw, back, or abdomen.  You have shortness of breath.  You have an increasing cough, or you cough up blood.  You have severe back or abdominal pain.  You feel nauseous or vomit.  You have severe  weakness.  You faint.  You have chills. This is an emergency. Do not wait to see if the pain will go away. Get medical help at once. Call your local emergency services (911 in [place] Do not drive yourself to the hospital. MAKE SURE YOU:   Understand these instructions.  Will watch your condition.  Will get help right away if you are not doing well or get worse. Document Released: 03/21/2005 Document Revised: 06/16/2013 Document Reviewed: 01/15/2008 [facility] Patient Information 2015 [place], [place]. This information is not intended to replace advice given to you by your health care provider. Make sure you discuss any questions you have with your health care provider.   Nausea and Vomiting Nausea is a sick feeling that often comes before throwing up (vomiting). Vomiting is a reflex where stomach contents come out of your mouth. Vomiting can cause severe loss of body fluids (dehydration). Children and elderly adults can become dehydrated quickly, especially if they also have diarrhea. Nausea and vomiting are symptoms of a condition or disease. It is important to find the cause of your symptoms. CAUSES   Direct irritation of the stomach lining. This irritation can result from increased acid production (gastroesophageal  reflux disease), infection, food poisoning, taking certain medicines (such as nonsteroidal anti-inflammatory drugs), alcohol use, or tobacco use.  Signals from the brain.These signals could be caused by a headache, heat exposure, an inner ear disturbance, increased pressure in the brain from injury, infection, a tumor, or a concussion, pain, emotional stimulus, or metabolic problems.  An obstruction in the gastrointestinal tract (bowel obstruction).  Illnesses such as diabetes, hepatitis, gallbladder problems, appendicitis, kidney problems, cancer, sepsis, atypical symptoms of a heart attack, or eating disorders.  Medical treatments such as chemotherapy and  radiation.  Receiving medicine that makes you sleep (general anesthetic) during surgery. DIAGNOSIS Your caregiver may ask for tests to be done if the problems do not improve after a few days. Tests may also be done if symptoms are severe or if the reason for the nausea and vomiting is not clear. Tests may include:  Urine tests.  Blood tests.  Stool tests.  Cultures (to look for evidence of infection).  X-rays or other imaging studies. Test results can help your caregiver make decisions about treatment or the need for additional tests. TREATMENT You need to stay well hydrated. Drink frequently but in small amounts.You may wish to drink water, sports drinks, clear broth, or eat frozen ice pops or gelatin dessert to help stay hydrated.When you eat, eating slowly may help prevent nausea.There are also some antinausea medicines that may help prevent nausea. HOME CARE INSTRUCTIONS   Take all medicine as directed by your caregiver.  If you do not have an appetite, do not force yourself to eat. However, you must continue to drink fluids.  If you have an appetite, eat a normal diet unless your caregiver tells you differently.  Eat a variety of complex carbohydrates (rice, wheat, potatoes, bread), lean meats, yogurt, fruits, and vegetables.  Avoid high-fat foods because they are more difficult to digest.  Drink enough water and fluids to keep your urine clear or pale yellow.  If you are dehydrated, ask your caregiver for specific rehydration instructions. Signs of dehydration may include:  Severe thirst.  Dry lips and mouth.  Dizziness.  Dark urine.  Decreasing urine frequency and amount.  Confusion.  Rapid breathing or pulse. SEEK IMMEDIATE MEDICAL CARE IF:   You have blood or brown flecks (like coffee grounds) in your vomit.  You have black or bloody stools.  You have a severe headache or stiff neck.  You are confused.  You have severe abdominal pain.  You have  chest pain or trouble breathing.  You do not urinate at least once every 8 hours.  You develop cold or clammy skin.  You continue to vomit for longer than 24 to 48 hours.  You have a fever. MAKE SURE YOU:   Understand these instructions.  Will watch your condition.  Will get help right away if you are not doing well or get worse. Document Released: 06/11/2005 Document Revised: 09/03/2011 Document Reviewed: 11/08/2010 [facility] Patient Information 2015 [place]. This information is not intended to replace advice given to you by your health care provider. Make sure you discuss any questions you have with your health care provider.

## 2015-01-19 ENCOUNTER — Ambulatory Visit (INDEPENDENT_AMBULATORY_CARE_PROVIDER_SITE_OTHER): Payer: Managed Care, Other (non HMO) | Admitting: Family Medicine

## 2015-01-19 VITALS — BP 128/80 | HR 97 | Temp 98.3°F | Resp 18 | Ht 63.0 in | Wt 164.6 lb

## 2015-01-19 DIAGNOSIS — I10 Essential (primary) hypertension: Secondary | ICD-10-CM

## 2015-01-19 DIAGNOSIS — M25551 Pain in right hip: Secondary | ICD-10-CM | POA: Diagnosis not present

## 2015-01-19 DIAGNOSIS — M4806 Spinal stenosis, lumbar region: Secondary | ICD-10-CM | POA: Diagnosis not present

## 2015-01-19 DIAGNOSIS — F411 Generalized anxiety disorder: Secondary | ICD-10-CM

## 2015-01-19 DIAGNOSIS — M1611 Unilateral primary osteoarthritis, right hip: Secondary | ICD-10-CM

## 2015-01-19 DIAGNOSIS — M48061 Spinal stenosis, lumbar region without neurogenic claudication: Secondary | ICD-10-CM

## 2015-01-19 MED ORDER — LISINOPRIL 40 MG PO TABS: ORAL_TABLET | ORAL | Status: AC

## 2015-01-19 MED ORDER — ALPRAZOLAM 1 MG PO TABS
1.0000 mg | ORAL_TABLET | Freq: Two times a day (BID) | ORAL | Status: DC
Start: 2015-01-19 — End: 2015-07-13

## 2015-01-19 MED ORDER — HYDROCODONE-ACETAMINOPHEN 10-325 MG PO TABS: 1.0000 | ORAL_TABLET | Freq: Three times a day (TID) | ORAL | Status: DC | PRN

## 2015-01-19 NOTE — Addendum Note (Signed)
Addended by: Abbe Amsterdam C on: 01/19/2015 10:03 PM   Modules accepted: Orders, Medications

## 2015-01-19 NOTE — Progress Notes (Signed)
[facility] [address], [place] [address] [phone]  Date:  01/19/2015   Name:  Brenda Rice   DOB:  Feb 05, 1956   MRN:  [id number]  PCP:  Abbe Amsterdam, MD    Chief Complaint: Hip Pain   History of Present Illness:  Brenda Rice is a 59 y.o. very pleasant female patient who presents with the following:  She has a lot of trouble with her right hip and right lower back over the last year or so.  She started with seeing Dr. Eulah Pont about 18 months ago, but they were not sure if she was at the point of a hip replacement. She then saw Dr. Maurice Small, the PM&R doctor at [facility].  She had two hip injections but they did not give her more than a couple of days of relief.  She went to see Dr. Jeral Fruit as she also has spinal stenosis- they hoped to tease out if her back or her hip was the main problem.   She had a Ct of her lumbar spine in June- she had moderate spinal stenosis and right neuromal foraminal stenosis at L3/4.  She then had a myelogram on 7/7- according to these results Dr. Jeral Fruit does not have any more to offer her from a NSG standpoint.  She does have significant spinal stenosis that will have to be managed. As he was not able to treat her surgically he asked me (PCP) to take over her pain management.    It loks like Dr. Jeral Fruit has been treang her with hydrocodone 10 on a regular basis.  She receiced #90, a 22 day supply, from him on 01/02/15  She went to the ER a week ago with belly pain- given an rx for hydrocodone  but she has not filled this.    She has used percocet but it made her too sleepy, she prefers hydrocodone.  She has been doing well with her drinking, has quit nearly completely per his report    Patient Active Problem List   Diagnosis Date Noted  . MDD (major depressive disorder) 10/06/2013  . Alcohol dependence 05/26/2013  . GAD (generalized anxiety disorder) 05/26/2013  . Depressive disorder 05/26/2013  . Benzodiazepine dependence  05/25/2013  . Alcohol dependence syndrome 02/15/2013  . Suicide attempt by multiple drug overdose 02/15/2013  . Substance induced mood disorder 02/15/2013  . Nicotine dependence 02/15/2013  . COPD (chronic obstructive pulmonary disease) 02/15/2013  . Hyponatremia 07/05/2012  . Nicotine addiction 09/02/2011  . Hypertension 07/12/2011  . 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 Hypertension   . Depression   . Suicide attempt     x 3  . Osteoarthritis     right hip  . Fx clavicle     Hx of left clavicle fx  . Hx: UTI (urinary tract infection)     Past Surgical History  Procedure Laterality Date  . Orif left clavicle fracture and pinning 2nd metacarpal  01/03/2010    History  Substance Use Topics  . Smoking status: Current Every Day Smoker -- 1.50 packs/day for 44 years    Types: Cigarettes  . Smokeless tobacco: Never Used  . Alcohol Use: 0.6 oz/week    0 Shots of liquor, 1 Cans of beer per week     Comment: was drinking beer daily now down to total 1.5 cans a week, quit hard liquor 1 month  ago    Family History  Problem Relation Age of Onset  . ADD / ADHD Neg Hx   . Anxiety disorder Neg Hx   . Alcohol abuse Neg Hx   . Bipolar disorder Neg Hx   . Dementia Neg Hx   . Depression Neg Hx   . Drug abuse Neg Hx   . OCD Neg Hx   . Schizophrenia Neg Hx     Allergies  Allergen Reactions  . Celexa [Citalopram Hydrobromide]     Hyponatremia thought due to SSRI    Medication list has been reviewed and updated.  Current Outpatient Prescriptions on File Prior to Visit  Medication Sig Dispense Refill  . ALPRAZolam (XANAX) 1 MG tablet Take 1 tablet (1 mg total) by mouth 2 (two) times daily. May take an additional 0.5 mg once a day if needed 230 tablet 1  . aspirin-acetaminophen-caffeine (EXCEDRIN MIGRAINE) 250-250-65 MG per tablet Take 2 tablets by mouth every 6 (six) hours as needed for  headache or migraine.    . Hyprom-Naphaz-Polysorb-Zn Sulf (CLEAR EYES COMPLETE OP) Place 1 drop into both eyes daily as needed (dry eyes).     Marland Kitchen ibuprofen (ADVIL,MOTRIN) 200 MG tablet Take 400 mg by mouth every 6 (six) hours as needed for moderate pain.    Marland Kitchen lisinopril (PRINIVIL,ZESTRIL) 40 MG tablet TAKE 1 TABLET BY MOUTH EVERY DAY FOR BLOOD PRESSURE CONTROL 90 tablet 3  . ondansetron (ZOFRAN ODT) 4 MG disintegrating tablet 4mg  ODT q4 hours prn nausea/vomit 15 tablet 0  . oxyCODONE-acetaminophen (PERCOCET/ROXICET) 5-325 MG per tablet TAKE 1 TABLET EVERY 4 TO 6 HOURS AS NEEDED FOR PAIN  0  . promethazine (PHENERGAN) 25 MG tablet Take 1 tablet (25 mg total) by mouth every 8 (eight) hours as needed for nausea or vomiting. 20 tablet 0  . tiZANidine (ZANAFLEX) 4 MG tablet TAKE 0.5-1 TABLET(S) BY MOUTH 3 TIMES A DAY AS NEEDED [PRN] SPASMS  0  . VENTOLIN HFA 108 (90 BASE) MCG/ACT inhaler INHALE 2 PUFFS INTO THE LUNGS EVERY 6 (SIX) HOURS AS NEEDED FOR WHEEZING. 54 Inhaler 1  . HYDROcodone-acetaminophen (NORCO) 5-325 MG per tablet Take 1-2 tablets by mouth every 6 (six) hours as needed for severe pain. (Patient not taking: Reported on 01/19/2015) 20 tablet 0  . naproxen (NAPROSYN) 500 MG tablet Take 500 mg by mouth 2 (two) times daily as needed for mild pain or moderate pain.   1   Current Facility-Administered Medications on File Prior to Visit  Medication Dose Route Frequency Provider Last Rate Last Dose  . acetaminophen (TYLENOL) tablet 975 mg  975 mg Oral Q6H PRN Pearline Cables, MD        Review of Systems:  As per HPI- otherwise negative.   Physical Examination: Filed Vitals:   01/19/15 1307  BP: 128/80  Pulse: 97  Temp: 98.3 F (36.8 C)  Resp: 18   Filed Vitals:   01/19/15 1307  Height: 5\' 3"  (1.6 m)  Weight: 164 lb 9.6 oz (74.662 kg)   Body mass index is 29.16 kg/(m^2). Ideal Body Weight: Weight in (lb) to have BMI = 25: 140.8  GEN: WDWN, NAD, Non-toxic, A & O x 3, has gained  weight but OW looks well HEENT: Atraumatic, Normocephalic. Neck supple. No masses, No LAD. Ears and Nose: No external deformity. CV: RRR, No M/G/R. No JVD. No thrill. No extra heart sounds. PULM: CTA B, no wheezes, crackles, rhonchi. No retractions. No resp. distress. No accessory muscle use. ABD: S,  NT, ND, +BS. No rebound. No HSM.  Belly benign EXTR: No c/c/e NEURO Normal gait.  PSYCH: Normally interactive. Conversant. Not depressed or anxious appearing.  Calm demeanor.  She has pain in the anterior right groin and lateral hip area   Assessment and Plan: Right hip pain  Primary osteoarthritis of right hip  Discussed with pt in detail.  It is uncertain if a hip replacement would help her, but she might like to consider surgery.   She will go back and see Dr. Eulah Pont for a consultation  I agreed to rx her hydrocodone for pain at least for now- if this becomes chronic we may need to have her see pain management.   Continue to avoid alcohol  Meds ordered this encounter  Medications  . HYDROcodone-acetaminophen (NORCO) 10-325 MG per tablet    Sig: Take 1 tablet by mouth every 8 (eight) hours as needed. May fill on 01/24/2015    Dispense:  90 tablet    Refill:  0  . lisinopril (PRINIVIL,ZESTRIL) 40 MG tablet    Sig: TAKE 1 TABLET BY MOUTH EVERY DAY FOR BLOOD PRESSURE CONTROL    Dispense:  90 tablet    Refill:  3  . ALPRAZolam (XANAX) 1 MG tablet    Sig: Take 1 tablet (1 mg total) by mouth 2 (two) times daily. May take an additional 0.5 mg once a day if needed    Dispense:  230 tablet    Refill:  1    This request is for a new prescription for a controlled substance as required by Federal/State law..   Refilled her chronic xanax and lisinopril as well  Signed Abbe Amsterdam, MD

## 2015-01-19 NOTE — Patient Instructions (Signed)
We can continue to treat you for pain in your hip with hydrocodone for now Please ask [facility] to [facility] the rx for [facility]codone  that you got last week  A hip replacement may be helpful for you- I am not sure Please see Dr. Eulah Pont to discuss this further-  [facility] Address: [address], [place], [place] [address]  [phone]  If you would like to have me treat your pain please always get your pain medication from me-   [facility] Policy for Prescribing Controlled Substances (Revised 04/2012) 1. Prescriptions for controlled substances will be filled by ONE provider at [facility] with whom you have established and developed a plan for your care, including follow-up. 2. You are encouraged to schedule an appointment with your prescriber at our appointment center for follow-up visits whenever possible. 3. If you request a prescription for the controlled substance while at [facility] for an acute problem (with someone other than your regular prescriber), you MAY be given a ONE-TIME prescription for a 30-day supply of the controlled substance, to allow time for you to return to see your regular prescriber for additional prescriptions.

## 2015-02-01 ENCOUNTER — Other Ambulatory Visit: Payer: Self-pay | Admitting: Neurosurgery

## 2015-02-04 ENCOUNTER — Telehealth: Payer: Self-pay | Admitting: *Deleted

## 2015-02-04 NOTE — Telephone Encounter (Signed)
Phoned to discuss mammogram with patient (knowing after reviewing chart & seeing laminectomy scheduled for the 17th, she probably would NOT be interested), but d/w pt and she confirmed that right now, her surgery is the only thing on her mind.  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  Expressed empathy for patient's situation and told her if there was anything we could do to assist her, not to hesitate to call.

## 2015-02-07 ENCOUNTER — Encounter (HOSPITAL_COMMUNITY)
Admission: RE | Admit: 2015-02-07 | Discharge: 2015-02-07 | Disposition: A | Discharge status: Home / Self Care | Payer: Managed Care, Other (non HMO) | Source: Ambulatory Visit | Attending: Neurosurgery | Admitting: Neurosurgery

## 2015-02-07 ENCOUNTER — Encounter (HOSPITAL_COMMUNITY): Payer: Self-pay

## 2015-02-07 HISTORY — DX: Chronic obstructive pulmonary disease, unspecified: J44.9

## 2015-02-07 HISTORY — DX: Myoneural disorder, unspecified: G70.9

## 2015-02-07 HISTORY — DX: Anxiety disorder, unspecified: F41.9

## 2015-02-07 LAB — COMPREHENSIVE METABOLIC PANEL
ALT: 22 U/L (ref 14–54)
ANION GAP: 10 (ref 5–15)
AST: 38 U/L (ref 15–41)
Albumin: 3.7 g/dL (ref 3.5–5.0)
Alkaline Phosphatase: 77 U/L (ref 38–126)
BUN: <5 mg/dL — ABNORMAL LOW (ref 6–20)
CALCIUM: 9.3 mg/dL (ref 8.9–10.3)
CHLORIDE: 93 mmol/L — AB (ref 101–111)
CO2: 27 mmol/L (ref 22–32)
Creatinine, Ser: 0.73 mg/dL (ref 0.44–1.00)
GFR calc non Af Amer: >60 mL/min (ref >60)
GLUCOSE: 101 mg/dL — AB (ref 65–99)
Potassium: 3.5 mmol/L (ref 3.5–5.1)
SODIUM: 130 mmol/L — AB (ref 135–145)
Total Bilirubin: 0.5 mg/dL (ref 0.3–1.2)
Total Protein: 6.7 g/dL (ref 6.5–8.1)

## 2015-02-07 LAB — CBC
HEMATOCRIT: 41.4 % (ref 36.0–46.0)
HEMOGLOBIN: 13.9 g/dL (ref 12.0–15.0)
MCH: 33.4 pg (ref 26.0–34.0)
MCHC: 33.6 g/dL (ref 30.0–36.0)
MCV: 99.5 fL (ref 78.0–100.0)
Platelets: 155 10*3/uL (ref 150–400)
RBC: 4.16 MIL/uL (ref 3.87–5.11)
RDW: 13.5 % (ref 11.5–15.5)
WBC: 6.8 10*3/uL (ref 4.0–10.5)

## 2015-02-07 LAB — SURGICAL PCR SCREEN
MRSA, PCR: NEGATIVE
Staphylococcus aureus: NEGATIVE

## 2015-02-07 NOTE — Pre-Procedure Instructions (Signed)
Brenda Rice  02/07/2015      [id number] [address] [place] [address] Phone: [phone] Fax: [phone]  [facility] - [place], [place] - [address] [place] [address] Phone: [phone] Fax: [phone]    Your procedure is scheduled on 02/09/2015  Report to [facility] Admitting at  6:00 A.M.  Call this number if you have problems the morning of surgery:  [phone]   Remember:  Do not eat food or drink liquids after midnight.  On TUESDAY  Take these medicines the morning of surgery with A SIP OF WATER use inhaler & pain medicine  (Hydrocodone) if you need them,  & take XANAX   Do not wear jewelry, make-up or nail polish.   Do not wear lotions, powders, or perfumes.  You may wear deodorant.   Do not shave 48 hours prior to surgery.     Do not bring valuables to the hospital.    [facility] is not responsible for any belongings or valuables.  Contacts, dentures or bridgework may not be worn into surgery.  Leave your suitcase in the car.  After surgery it may be brought to your room.  For patients admitted to the hospital, discharge time will be determined by your treatment team.  Patients discharged the day of surgery will not be allowed to drive home.   Name and phone number of your driver:   With spouse Special instructions:  Special Instructions: [facility] - Preparing for Surgery  Before surgery, you can play an important role.  Because skin is not sterile, your skin needs to be as free of germs as possible.  You can reduce the number of germs on you skin by washing with CHG (chlorahexidine gluconate) soap before surgery.  CHG is an antiseptic cleaner which kills germs and bonds with the skin to continue killing germs even after washing.  Please DO NOT use if you have an allergy to CHG or antibacterial soaps.  If your skin becomes  reddened/irritated stop using the CHG and inform your nurse when you arrive at Short Stay.  Do not shave (including legs and underarms) for at least 48 hours prior to the first CHG shower.  You may shave your face.  Please follow these instructions carefully:   1.  Shower with CHG Soap the night before surgery and the  morning of Surgery.  2.  If you choose to wash your hair, wash your hair first as usual with your  normal shampoo.  3.  After you shampoo, rinse your hair and body thoroughly to remove the  Shampoo.  4.  Use CHG as you would any other liquid soap.  You can apply chg directly to the skin and wash gently with scrungie or a clean washcloth.  5.  Apply the CHG Soap to your body ONLY FROM THE NECK DOWN.    Do not use on open wounds or open sores.  Avoid contact with your eyes, ears, mouth and genitals (private parts).  Wash genitals (private parts)   with your normal soap.  6.  Wash thoroughly, paying special attention to the area where your surgery will be performed.  7.  Thoroughly rinse your body with warm water from the neck down.  8.  DO NOT shower/wash with your normal soap after using and rinsing off   the CHG Soap.  9.  Pat yourself dry with a clean towel.            10.  Wear clean pajamas.            11.  Place clean sheets on your bed the night of your first shower and do not sleep with pets.  Day of Surgery  Do not apply any lotions/deodorants the morning of surgery.  Please wear clean clothes to the hospital/surgery center.  Please read over the following fact sheets that you were given. Pain Booklet, Coughing and Deep Breathing, MRSA Information and Surgical Site Infection Prevention

## 2015-02-07 NOTE — Progress Notes (Signed)
Pt. Reports that she had a stress test 5 yrs ago or >, she doesn't remenmber where. The visit that she had to ED recently proved to be unrelated to her heart. She denies heart, chest concerns today. Pt. Followed by Dr. [name] at [facility] medicine.

## 2015-02-08 ENCOUNTER — Telehealth: Payer: Self-pay

## 2015-02-08 DIAGNOSIS — F330 Major depressive disorder, recurrent, mild: Secondary | ICD-10-CM

## 2015-02-08 MED ORDER — CEFAZOLIN SODIUM-DEXTROSE 2-3 GM-% IV SOLR
2.0000 g | INTRAVENOUS | Status: AC
  Administered 2015-02-09: 2 g via INTRAVENOUS
  Filled 2015-02-08 (×2): qty 50

## 2015-02-08 MED ORDER — CITALOPRAM HYDROBROMIDE 20 MG PO TABS: 20.0000 mg | ORAL_TABLET | Freq: Every day | ORAL | Status: DC

## 2015-02-08 NOTE — Telephone Encounter (Signed)
Called and spoke with her.  It was determined that her hip pain was likely coming from her lumbar spine after all, so she is having a laminectomy tomorrow. Somehow her celexa was not on her med list at pre-op yesterday- she is taking 20 mg and needs a RF now anyway.  She has had hyponatremia in the past thought related to celexa so we have been watching this carefully.  She did not do well without the SSRI.  As long as she avoids beer drinking she has done ok.    She was able to get her surgery approved and is excited to hopefully have less pain. I wished her the best in her operation and recovery

## 2015-02-08 NOTE — Telephone Encounter (Signed)
Pt is wanting to talk with dr copland before going into surgery tomorrow   Best number [phone]   Pt was very upset and crying that dr copland was not here today

## 2015-02-08 NOTE — H&P (Signed)
Brenda Rice is an 59 y.o. female.   Chief Complaint: right leg pain HPI: patient with many years of lumbar pain with radiation to the right leg, no betyter with conservative treatment. At work she can not sit and is unable to sleep. Has failed with conservative treatment.  Past Medical History  Diagnosis Date  . Hypertension   . Suicide attempt     x 3  . Osteoarthritis     right hip  . Fx clavicle     Hx of left clavicle fx  . Hx: UTI (urinary tract infection)   . COPD (chronic obstructive pulmonary disease)   . Depression     pt. reports that she has not taken anything for depression in a couple of months. States she was told not to take Celexa & Seroquel together. Pt. is vague & unsure of her PCP wishes as to what if any antidepressant she should be taking.   . Anxiety   . Headache   . Neuromuscular disorder     spinal stenosis- lumbar     Past Surgical History  Procedure Laterality Date  . Orif left clavicle fracture and pinning 2nd metacarpal  01/03/2010    Family History  Problem Relation Age of Onset  . ADD / ADHD Neg Hx   . Anxiety disorder Neg Hx   . Alcohol abuse Neg Hx   . Bipolar disorder Neg Hx   . Dementia Neg Hx   . Depression Neg Hx   . Drug abuse Neg Hx   . OCD Neg Hx   . Schizophrenia Neg Hx    Social History:  reports that she has been smoking Cigarettes.  She has a 66 pack-year smoking history. She has never used smokeless tobacco. She reports that she drinks about 0.6 oz of alcohol per week. She reports that she does not use illicit drugs.  Allergies:  Allergies  Allergen Reactions  . Celexa [Citalopram Hydrobromide] Other (See Comments)    Hyponatremia thought due to SSRI    No prescriptions prior to admission    Results for orders placed or performed during the hospital encounter of 02/07/15 (from the past 48 hour(s))  Comprehensive metabolic panel     Status: Abnormal   Collection Time: 02/07/15  4:00 PM  Result Value Ref Range   Sodium  130 (L) 135 - 145 mmol/L   Potassium 3.5 3.5 - 5.1 mmol/L   Chloride 93 (L) 101 - 111 mmol/L   CO2 27 22 - 32 mmol/L   Glucose, Bld 101 (H) 65 - 99 mg/dL   BUN <5 (L) 6 - 20 mg/dL   Creatinine, Ser 0.73 0.44 - 1.00 mg/dL   Calcium 9.3 8.9 - 10.3 mg/dL   Total Protein 6.7 6.5 - 8.1 g/dL   Albumin 3.7 3.5 - 5.0 g/dL   AST 38 15 - 41 U/L   ALT 22 14 - 54 U/L   Alkaline Phosphatase 77 38 - 126 U/L   Total Bilirubin 0.5 0.3 - 1.2 mg/dL   GFR calc non Af Amer >60 >60 mL/min   GFR calc Af Amer >60 >60 mL/min    Comment: (NOTE) The eGFR has been calculated using the CKD EPI equation. This calculation has not been validated in all clinical situations. eGFR's persistently <60 mL/min signify possible Chronic Kidney Disease.    Anion gap 10 5 - 15  CBC     Status: None   Collection Time: 02/07/15  4:00 PM  Result Value Ref  Range   WBC 6.8 4.0 - 10.5 K/uL   RBC 4.16 3.87 - 5.11 MIL/uL   Hemoglobin 13.9 12.0 - 15.0 g/dL   HCT 41.4 36.0 - 46.0 %   MCV 99.5 78.0 - 100.0 fL   MCH 33.4 26.0 - 34.0 pg   MCHC 33.6 30.0 - 36.0 g/dL   RDW 13.5 11.5 - 15.5 %   Platelets 155 150 - 400 K/uL  Surgical pcr screen     Status: None   Collection Time: 02/07/15  4:00 PM  Result Value Ref Range   MRSA, PCR NEGATIVE NEGATIVE   Staphylococcus aureus NEGATIVE NEGATIVE    Comment:        The Xpert SA Assay (FDA approved for NASAL specimens in patients over 77 years of age), is one component of a comprehensive surveillance program.  Test performance has been validated by [facility] for patients greater than or equal to 52 year old. It is not intended to diagnose infection nor to guide or monitor treatment.    No results found.  Review of Systems  Constitutional: Negative.   HENT: Negative.   Eyes: Negative.   Respiratory: Negative.   Cardiovascular: Negative.   Gastrointestinal: Negative.   Genitourinary: Negative.   Musculoskeletal: Positive for back pain.  Skin: Negative.    Neurological: Positive for focal weakness.  Endo/Heme/Allergies: Negative.   Psychiatric/Behavioral: Negative.     There were no vitals taken for this visit. Physical Exam hent,nl. Neck, nl. Cv, nl. Lungs,clear. Abdomen, soft. Extremities, nl. NEURO weakness of the right quadriceps and foot DF. Femoral stretch maneuver positive in the right. Radiological studies shows stenosis at l34, 45, with small step-off which do not move with flexion and extension  Assessment/Plan Right l3-4,4-5 laminectomies and foraminotomies. She is aware of risks and benefits  Milton Streicher M 02/08/2015, 9:57 PM

## 2015-02-09 ENCOUNTER — Ambulatory Visit (HOSPITAL_COMMUNITY): Payer: Managed Care, Other (non HMO)

## 2015-02-09 ENCOUNTER — Inpatient Hospital Stay (HOSPITAL_COMMUNITY)
Admission: RE | Admit: 2015-02-09 | Discharge: 2015-02-10 | DRG: 517 | Disposition: A | Discharge status: Home / Self Care | Payer: Managed Care, Other (non HMO) | Source: Ambulatory Visit | Attending: Neurosurgery | Admitting: Neurosurgery

## 2015-02-09 ENCOUNTER — Ambulatory Visit (HOSPITAL_COMMUNITY): Payer: Managed Care, Other (non HMO) | Admitting: Certified Registered Nurse Anesthetist

## 2015-02-09 ENCOUNTER — Encounter (HOSPITAL_COMMUNITY)
Admission: RE | Disposition: A | Discharge status: Home / Self Care | Payer: Self-pay | Source: Ambulatory Visit | Attending: Neurosurgery

## 2015-02-09 ENCOUNTER — Encounter (HOSPITAL_COMMUNITY): Payer: Self-pay | Admitting: *Deleted

## 2015-02-09 DIAGNOSIS — F329 Major depressive disorder, single episode, unspecified: Secondary | ICD-10-CM | POA: Diagnosis present

## 2015-02-09 DIAGNOSIS — J449 Chronic obstructive pulmonary disease, unspecified: Secondary | ICD-10-CM | POA: Diagnosis present

## 2015-02-09 DIAGNOSIS — M1611 Unilateral primary osteoarthritis, right hip: Secondary | ICD-10-CM | POA: Diagnosis present

## 2015-02-09 DIAGNOSIS — Z888 Allergy status to other drugs, medicaments and biological substances status: Secondary | ICD-10-CM

## 2015-02-09 DIAGNOSIS — M4806 Spinal stenosis, lumbar region: Secondary | ICD-10-CM | POA: Diagnosis present

## 2015-02-09 DIAGNOSIS — I10 Essential (primary) hypertension: Secondary | ICD-10-CM | POA: Diagnosis present

## 2015-02-09 DIAGNOSIS — Z419 Encounter for procedure for purposes other than remedying health state, unspecified: Secondary | ICD-10-CM

## 2015-02-09 DIAGNOSIS — M5116 Intervertebral disc disorders with radiculopathy, lumbar region: Secondary | ICD-10-CM | POA: Diagnosis present

## 2015-02-09 DIAGNOSIS — M48061 Spinal stenosis, lumbar region without neurogenic claudication: Secondary | ICD-10-CM | POA: Diagnosis present

## 2015-02-09 DIAGNOSIS — F419 Anxiety disorder, unspecified: Secondary | ICD-10-CM | POA: Diagnosis present

## 2015-02-09 DIAGNOSIS — R51 Headache: Secondary | ICD-10-CM | POA: Diagnosis present

## 2015-02-09 DIAGNOSIS — F17210 Nicotine dependence, cigarettes, uncomplicated: Secondary | ICD-10-CM | POA: Diagnosis present

## 2015-02-09 DIAGNOSIS — M4186 Other forms of scoliosis, lumbar region: Secondary | ICD-10-CM | POA: Diagnosis present

## 2015-02-09 HISTORY — PX: LUMBAR LAMINECTOMY/DECOMPRESSION MICRODISCECTOMY: SHX5026

## 2015-02-09 SURGERY — LUMBAR LAMINECTOMY/DECOMPRESSION MICRODISCECTOMY 2 LEVELS
Anesthesia: General

## 2015-02-09 MED ORDER — FENTANYL CITRATE (PF) 100 MCG/2ML IJ SOLN
INTRAMUSCULAR | Status: DC | PRN
  Administered 2015-02-09: 25 ug via INTRAVENOUS

## 2015-02-09 MED ORDER — ROCURONIUM BROMIDE 100 MG/10ML IV SOLN
INTRAVENOUS | Status: DC | PRN
  Administered 2015-02-09: 10 mg via INTRAVENOUS
  Administered 2015-02-09: 50 mg via INTRAVENOUS

## 2015-02-09 MED ORDER — OXYCODONE-ACETAMINOPHEN 5-325 MG PO TABS
1.0000 | ORAL_TABLET | ORAL | Status: DC | PRN
  Administered 2015-02-09 – 2015-02-10 (×5): 2 via ORAL
  Filled 2015-02-09 (×4): qty 2

## 2015-02-09 MED ORDER — HYDROMORPHONE HCL 1 MG/ML IJ SOLN
INTRAMUSCULAR | Status: AC
Start: 2015-02-09 — End: 2015-02-10
  Filled 2015-02-09: qty 1

## 2015-02-09 MED ORDER — ACETAMINOPHEN 325 MG PO TABS: 650.0000 mg | ORAL_TABLET | ORAL | Status: DC | PRN

## 2015-02-09 MED ORDER — PROPOFOL 10 MG/ML IV BOLUS
INTRAVENOUS | Status: DC | PRN
  Administered 2015-02-09: 150 mg via INTRAVENOUS

## 2015-02-09 MED ORDER — SODIUM CHLORIDE 0.9 % IJ SOLN: 3.0000 mL | INTRAMUSCULAR | Status: DC | PRN

## 2015-02-09 MED ORDER — SUCCINYLCHOLINE CHLORIDE 20 MG/ML IJ SOLN
INTRAMUSCULAR | Status: AC
  Filled 2015-02-09: qty 1

## 2015-02-09 MED ORDER — DEXAMETHASONE SODIUM PHOSPHATE 4 MG/ML IJ SOLN
INTRAMUSCULAR | Status: AC
  Filled 2015-02-09: qty 1

## 2015-02-09 MED ORDER — ACETAMINOPHEN 650 MG RE SUPP: 650.0000 mg | RECTAL | Status: DC | PRN

## 2015-02-09 MED ORDER — ROCURONIUM BROMIDE 50 MG/5ML IV SOLN
INTRAVENOUS | Status: AC
  Filled 2015-02-09: qty 1

## 2015-02-09 MED ORDER — ONDANSETRON HCL 4 MG/2ML IJ SOLN
INTRAMUSCULAR | Status: AC
  Filled 2015-02-09: qty 2

## 2015-02-09 MED ORDER — MORPHINE SULFATE (PF) 2 MG/ML IV SOLN
INTRAVENOUS | Status: AC
  Filled 2015-02-09: qty 1

## 2015-02-09 MED ORDER — CEFAZOLIN SODIUM 1-5 GM-% IV SOLN
1.0000 g | Freq: Three times a day (TID) | INTRAVENOUS | Status: AC
  Administered 2015-02-09 – 2015-02-10 (×2): 1 g via INTRAVENOUS
  Filled 2015-02-09 (×2): qty 50

## 2015-02-09 MED ORDER — DIAZEPAM 5 MG PO TABS
5.0000 mg | ORAL_TABLET | Freq: Four times a day (QID) | ORAL | Status: DC | PRN
  Administered 2015-02-09 – 2015-02-10 (×3): 5 mg via ORAL
  Filled 2015-02-09 (×2): qty 1

## 2015-02-09 MED ORDER — FENTANYL CITRATE (PF) 100 MCG/2ML IJ SOLN
INTRAMUSCULAR | Status: DC | PRN
  Administered 2015-02-09: 50 ug via INTRAVENOUS
  Administered 2015-02-09: 100 ug via INTRAVENOUS
  Administered 2015-02-09 (×2): 50 ug via INTRAVENOUS
  Administered 2015-02-09: 100 ug via INTRAVENOUS
  Administered 2015-02-09: 50 ug via INTRAVENOUS
  Administered 2015-02-09: 100 ug via INTRAVENOUS

## 2015-02-09 MED ORDER — THROMBIN 5000 UNITS EX SOLR
CUTANEOUS | Status: DC | PRN
  Administered 2015-02-09 (×2): 5000 [IU] via TOPICAL

## 2015-02-09 MED ORDER — HYDROMORPHONE HCL 1 MG/ML IJ SOLN
0.2500 mg | INTRAMUSCULAR | Status: DC | PRN
  Administered 2015-02-09 (×4): 0.5 mg via INTRAVENOUS

## 2015-02-09 MED ORDER — SODIUM CHLORIDE 0.9 % IJ SOLN
INTRAMUSCULAR | Status: AC
  Filled 2015-02-09: qty 10

## 2015-02-09 MED ORDER — PROPOFOL 10 MG/ML IV BOLUS
INTRAVENOUS | Status: AC
  Filled 2015-02-09: qty 20

## 2015-02-09 MED ORDER — DOCUSATE SODIUM 100 MG PO CAPS
100.0000 mg | ORAL_CAPSULE | Freq: Two times a day (BID) | ORAL | Status: DC
  Administered 2015-02-09 – 2015-02-10 (×2): 100 mg via ORAL
  Filled 2015-02-09 (×2): qty 1

## 2015-02-09 MED ORDER — SODIUM CHLORIDE 0.9 % IV SOLN
INTRAVENOUS | Status: DC
  Administered 2015-02-09: 13:00:00 via INTRAVENOUS

## 2015-02-09 MED ORDER — DEXAMETHASONE SODIUM PHOSPHATE 4 MG/ML IJ SOLN
INTRAMUSCULAR | Status: DC | PRN
  Administered 2015-02-09: 4 mg via INTRAVENOUS

## 2015-02-09 MED ORDER — GLYCOPYRROLATE 0.2 MG/ML IJ SOLN
INTRAMUSCULAR | Status: AC
  Filled 2015-02-09: qty 3

## 2015-02-09 MED ORDER — NEOSTIGMINE METHYLSULFATE 10 MG/10ML IV SOLN
INTRAVENOUS | Status: DC | PRN
  Administered 2015-02-09: 4 mg via INTRAVENOUS

## 2015-02-09 MED ORDER — LIDOCAINE HCL (CARDIAC) 20 MG/ML IV SOLN
INTRAVENOUS | Status: AC
  Filled 2015-02-09: qty 5

## 2015-02-09 MED ORDER — FENTANYL CITRATE (PF) 250 MCG/5ML IJ SOLN
INTRAMUSCULAR | Status: AC
  Filled 2015-02-09: qty 5

## 2015-02-09 MED ORDER — SODIUM CHLORIDE 0.9 % IV SOLN: 250.0000 mL | INTRAVENOUS | Status: DC

## 2015-02-09 MED ORDER — MORPHINE SULFATE (PF) 2 MG/ML IV SOLN
1.0000 mg | INTRAVENOUS | Status: DC | PRN
  Administered 2015-02-09: 2 mg via INTRAVENOUS

## 2015-02-09 MED ORDER — MIDAZOLAM HCL 5 MG/5ML IJ SOLN
INTRAMUSCULAR | Status: DC | PRN
  Administered 2015-02-09: 2 mg via INTRAVENOUS

## 2015-02-09 MED ORDER — PHENYLEPHRINE 40 MCG/ML (10ML) SYRINGE FOR IV PUSH (FOR BLOOD PRESSURE SUPPORT)
PREFILLED_SYRINGE | INTRAVENOUS | Status: AC
  Filled 2015-02-09: qty 10

## 2015-02-09 MED ORDER — METHYLPREDNISOLONE ACETATE 80 MG/ML IJ SUSP
INTRAMUSCULAR | Status: DC | PRN
  Administered 2015-02-09: 80 mg

## 2015-02-09 MED ORDER — OXYCODONE-ACETAMINOPHEN 5-325 MG PO TABS
ORAL_TABLET | ORAL | Status: AC
  Filled 2015-02-09: qty 2

## 2015-02-09 MED ORDER — ZOLPIDEM TARTRATE 5 MG PO TABS: 5.0000 mg | ORAL_TABLET | Freq: Every evening | ORAL | Status: DC | PRN

## 2015-02-09 MED ORDER — LIDOCAINE HCL (CARDIAC) 20 MG/ML IV SOLN
INTRAVENOUS | Status: DC | PRN
  Administered 2015-02-09: 60 mg via INTRAVENOUS

## 2015-02-09 MED ORDER — HYDROMORPHONE HCL 1 MG/ML IJ SOLN
INTRAMUSCULAR | Status: AC
  Filled 2015-02-09: qty 1

## 2015-02-09 MED ORDER — ONDANSETRON HCL 4 MG/2ML IJ SOLN
INTRAMUSCULAR | Status: DC | PRN
  Administered 2015-02-09: 4 mg via INTRAVENOUS

## 2015-02-09 MED ORDER — EPHEDRINE SULFATE 50 MG/ML IJ SOLN
INTRAMUSCULAR | Status: AC
  Filled 2015-02-09: qty 1

## 2015-02-09 MED ORDER — PHENOL 1.4 % MT LIQD: 1.0000 | OROMUCOSAL | Status: DC | PRN

## 2015-02-09 MED ORDER — MENTHOL 3 MG MT LOZG: 1.0000 | LOZENGE | OROMUCOSAL | Status: DC | PRN

## 2015-02-09 MED ORDER — MIDAZOLAM HCL 2 MG/2ML IJ SOLN
INTRAMUSCULAR | Status: AC
  Filled 2015-02-09: qty 4

## 2015-02-09 MED ORDER — ONDANSETRON HCL 4 MG/2ML IJ SOLN: 4.0000 mg | INTRAMUSCULAR | Status: DC | PRN

## 2015-02-09 MED ORDER — LACTATED RINGERS IV SOLN
INTRAVENOUS | Status: DC
  Administered 2015-02-09 (×2): via INTRAVENOUS

## 2015-02-09 MED ORDER — LISINOPRIL 40 MG PO TABS
40.0000 mg | ORAL_TABLET | Freq: Every day | ORAL | Status: DC
  Administered 2015-02-10: 40 mg via ORAL
  Filled 2015-02-09 (×2): qty 1
  Filled 2015-02-09: qty 2

## 2015-02-09 MED ORDER — SODIUM CHLORIDE 0.9 % IJ SOLN
3.0000 mL | Freq: Two times a day (BID) | INTRAMUSCULAR | Status: DC
  Administered 2015-02-10: 3 mL via INTRAVENOUS

## 2015-02-09 MED ORDER — 0.9 % SODIUM CHLORIDE (POUR BTL) OPTIME
TOPICAL | Status: DC | PRN
  Administered 2015-02-09: 1000 mL

## 2015-02-09 MED ORDER — PHENYLEPHRINE HCL 10 MG/ML IJ SOLN
INTRAMUSCULAR | Status: DC | PRN
  Administered 2015-02-09 (×3): 40 ug via INTRAVENOUS

## 2015-02-09 MED ORDER — HEMOSTATIC AGENTS (NO CHARGE) OPTIME
TOPICAL | Status: DC | PRN
  Administered 2015-02-09: 1 via TOPICAL

## 2015-02-09 MED ORDER — FENTANYL CITRATE (PF) 100 MCG/2ML IJ SOLN
INTRAMUSCULAR | Status: AC
  Filled 2015-02-09: qty 2

## 2015-02-09 MED ORDER — CITALOPRAM HYDROBROMIDE 20 MG PO TABS
20.0000 mg | ORAL_TABLET | Freq: Every day | ORAL | Status: DC
  Administered 2015-02-09 – 2015-02-10 (×2): 20 mg via ORAL
  Filled 2015-02-09 (×2): qty 2
  Filled 2015-02-09 (×2): qty 1

## 2015-02-09 MED ORDER — ARTIFICIAL TEARS OP OINT
TOPICAL_OINTMENT | OPHTHALMIC | Status: DC | PRN
  Administered 2015-02-09: 1 via OPHTHALMIC

## 2015-02-09 MED ORDER — BUPIVACAINE-EPINEPHRINE (PF) 0.5% -1:200000 IJ SOLN
INTRAMUSCULAR | Status: DC | PRN
  Administered 2015-02-09: 10 mL via PERINEURAL

## 2015-02-09 MED ORDER — DIAZEPAM 5 MG PO TABS
ORAL_TABLET | ORAL | Status: AC
Start: 2015-02-09 — End: 2015-02-10
  Filled 2015-02-09: qty 1

## 2015-02-09 MED ORDER — NEOSTIGMINE METHYLSULFATE 10 MG/10ML IV SOLN
INTRAVENOUS | Status: AC
  Filled 2015-02-09: qty 1

## 2015-02-09 MED ORDER — GLYCOPYRROLATE 0.2 MG/ML IJ SOLN
INTRAMUSCULAR | Status: DC | PRN
  Administered 2015-02-09: 0.6 mg via INTRAVENOUS

## 2015-02-09 SURGICAL SUPPLY — 59 items
APL SKNCLS STERI-STRIP NONHPOA (GAUZE/BANDAGES/DRESSINGS) ×1
BENZOIN TINCTURE PRP APPL 2/3 (GAUZE/BANDAGES/DRESSINGS) ×3 IMPLANT
BLADE CLIPPER SURG (BLADE) IMPLANT
BUR ACORN 6.0 (BURR) ×1 IMPLANT
BUR ACORN 6.0MM (BURR) ×1
BUR MATCHSTICK NEURO 3.0 LAGG (BURR) ×3 IMPLANT
CANISTER SUCT 3000ML PPV (MISCELLANEOUS) ×3 IMPLANT
CLOSURE WOUND 1/2 X4 (GAUZE/BANDAGES/DRESSINGS) ×1
DRAPE LAPAROTOMY 100X72X124 (DRAPES) ×3 IMPLANT
DRAPE MICROSCOPE LEICA (MISCELLANEOUS) ×3 IMPLANT
DRAPE POUCH INSTRU U-SHP 10X18 (DRAPES) ×3 IMPLANT
DRAPE PROXIMA HALF (DRAPES) ×4 IMPLANT
DRSG PAD ABDOMINAL 8X10 ST (GAUZE/BANDAGES/DRESSINGS) IMPLANT
DURAPREP 26ML APPLICATOR (WOUND CARE) ×3 IMPLANT
ELECT REM PT RETURN 9FT ADLT (ELECTROSURGICAL) ×3
ELECTRODE REM PT RTRN 9FT ADLT (ELECTROSURGICAL) ×1 IMPLANT
GAUZE SPONGE 4X4 12PLY STRL (GAUZE/BANDAGES/DRESSINGS) ×3 IMPLANT
GAUZE SPONGE 4X4 16PLY XRAY LF (GAUZE/BANDAGES/DRESSINGS) IMPLANT
GLOVE BIOGEL M 8.0 STRL (GLOVE) ×3 IMPLANT
GLOVE EXAM NITRILE LRG STRL (GLOVE) IMPLANT
GLOVE EXAM NITRILE MD LF STRL (GLOVE) IMPLANT
GLOVE EXAM NITRILE XL STR (GLOVE) IMPLANT
GLOVE EXAM NITRILE XS STR PU (GLOVE) IMPLANT
GLOVE SS BIOGEL STRL SZ 7 (GLOVE) IMPLANT
GLOVE SUPERSENSE BIOGEL SZ 7 (GLOVE) ×2
GLOVE SURG SS PI 7.5 STRL IVOR (GLOVE) ×2 IMPLANT
GOWN STRL REUS W/ TWL LRG LVL3 (GOWN DISPOSABLE) ×1 IMPLANT
GOWN STRL REUS W/ TWL XL LVL3 (GOWN DISPOSABLE) IMPLANT
GOWN STRL REUS W/TWL 2XL LVL3 (GOWN DISPOSABLE) IMPLANT
GOWN STRL REUS W/TWL LRG LVL3 (GOWN DISPOSABLE) ×6
GOWN STRL REUS W/TWL XL LVL3 (GOWN DISPOSABLE) ×3
KIT BASIN OR (CUSTOM PROCEDURE TRAY) ×3 IMPLANT
KIT ROOM TURNOVER OR (KITS) ×3 IMPLANT
NDL HYPO 18GX1.5 BLUNT FILL (NEEDLE) IMPLANT
NDL HYPO 21X1.5 SAFETY (NEEDLE) IMPLANT
NDL HYPO 25X1 1.5 SAFETY (NEEDLE) IMPLANT
NDL SPNL 20GX3.5 QUINCKE YW (NEEDLE) IMPLANT
NEEDLE HYPO 18GX1.5 BLUNT FILL (NEEDLE) ×3 IMPLANT
NEEDLE HYPO 21X1.5 SAFETY (NEEDLE) IMPLANT
NEEDLE HYPO 25X1 1.5 SAFETY (NEEDLE) ×3 IMPLANT
NEEDLE SPNL 20GX3.5 QUINCKE YW (NEEDLE) IMPLANT
NS IRRIG 1000ML POUR BTL (IV SOLUTION) ×3 IMPLANT
PACK LAMINECTOMY NEURO (CUSTOM PROCEDURE TRAY) ×3 IMPLANT
PAD ARMBOARD 7.5X6 YLW CONV (MISCELLANEOUS) ×9 IMPLANT
PATTIES SURGICAL .5 X1 (DISPOSABLE) ×1 IMPLANT
RUBBERBAND STERILE (MISCELLANEOUS) ×6 IMPLANT
SPONGE LAP 4X18 X RAY DECT (DISPOSABLE) IMPLANT
SPONGE SURGIFOAM ABS GEL SZ50 (HEMOSTASIS) ×3 IMPLANT
STRIP CLOSURE SKIN 1/2X4 (GAUZE/BANDAGES/DRESSINGS) ×2 IMPLANT
SUT VIC AB 0 CT1 18XCR BRD8 (SUTURE) ×1 IMPLANT
SUT VIC AB 0 CT1 8-18 (SUTURE) ×6
SUT VIC AB 2-0 CP2 18 (SUTURE) ×3 IMPLANT
SUT VIC AB 3-0 SH 8-18 (SUTURE) ×3 IMPLANT
SYR 20ML ECCENTRIC (SYRINGE) ×3 IMPLANT
SYR 5ML LL (SYRINGE) ×2 IMPLANT
TAPE CLOTH SURG 4X10 WHT LF (GAUZE/BANDAGES/DRESSINGS) ×2 IMPLANT
TOWEL OR 17X24 6PK STRL BLUE (TOWEL DISPOSABLE) ×3 IMPLANT
TOWEL OR 17X26 10 PK STRL BLUE (TOWEL DISPOSABLE) ×3 IMPLANT
WATER STERILE IRR 1000ML POUR (IV SOLUTION) ×3 IMPLANT

## 2015-02-09 NOTE — Anesthesia Postprocedure Evaluation (Signed)
  Anesthesia Post-op Note  Patient: Brenda Rice  Procedure(s) Performed: Procedure(s) with comments: Right Lumbar three- four, Lumbar four- five Laminectomy (N/A) - L3-4 L4-5 Laminectomy  Patient Location: PACU  Anesthesia Type:General  Level of Consciousness: awake, alert , oriented, patient cooperative and responds to stimulation  Airway and Oxygen Therapy: Patient Spontanous Breathing and Patient connected to nasal cannula oxygen  Post-op Pain: mild  Post-op Assessment: Post-op Vital signs reviewed, Patient's Cardiovascular Status Stable, Respiratory Function Stable, Patent Airway, No signs of Nausea or vomiting and Pain level controlled LLE Motor Response: Purposeful movement, Responds to commands LLE Sensation: Full sensation, No numbness, No tingling RLE Motor Response: Responds to commands, Purposeful movement RLE Sensation: No numbness, No tingling      Post-op Vital Signs: Reviewed and stable  Last Vitals:  Filed Vitals:   02/09/15 1250  BP:   Pulse: 93  Temp:   Resp: 8    Complications: No apparent anesthesia complications

## 2015-02-09 NOTE — Evaluation (Signed)
Physical Therapy Evaluation Patient Details Name: Brenda Rice MRN: [id number] DOB: 1956-04-19 Today's Date: 02/09/2015   History of Present Illness  Patient is a 59 y/o female s/p L3-4 L4-5 Laminectomy. PMH includes HTN, suicide attempt x3, UTI, depression, anxiety.  Clinical Impression  Patient presents with lethargy and balance deficits s/p above surgery. Tolerated ambulation to bathroom with Min A for balance/safety. Pt's spouse will be at home for the rest of the week to assist. Reviewed back precautions. Will follow acutely to maximize independence and mobility prior to return home.    Follow Up Recommendations Supervision for mobility/OOB;No PT follow up    Equipment Recommendations  Other (comment) (TBD.)    Recommendations for Other Services OT consult     Precautions / Restrictions Precautions Precautions: Back Precaution Booklet Issued: No Precaution Comments: Reviewed 3/3 back precautions. Restrictions Weight Bearing Restrictions: No      Mobility  Bed Mobility Overal bed mobility: Needs Assistance Bed Mobility: Rolling;Sidelying to Sit;Sit to Sidelying Rolling: Min guard Sidelying to sit: Min guard;HOB elevated     Sit to sidelying: Min guard;HOB elevated General bed mobility comments: Cues for log roll technique. Use of rail for support.   Transfers Overall transfer level: Needs assistance Equipment used: None Transfers: Sit to/from Stand Sit to Stand: Min guard         General transfer comment: Min guard for safety as pt lethargic. Stood from [employer], from toilet x1. Cues to adhere to back precautions during mobility.  Ambulation/Gait Ambulation/Gait assistance: Min assist Ambulation Distance (Feet): 12 Feet (x2 bouts) Assistive device:  (IV pole) Gait Pattern/deviations: Step-through pattern;Decreased stride length Gait velocity: slow   General Gait Details: Pt with slow, guarded gait holding onto IV pole for support. Min A for safety/balance  due to lethargy.  Stairs            Wheelchair Mobility    Modified Rankin (Stroke Patients Only)       Balance Overall balance assessment: Needs assistance Sitting-balance support: Feet supported;No upper extremity supported Sitting balance-Leahy Scale: Fair     Standing balance support: During functional activity Standing balance-Leahy Scale: Fair Standing balance comment: Furniture reaching within room for support. Able to wash hands ons tanding with 1 UE support.                              Pertinent Vitals/Pain Pain Assessment: No/denies pain ("still feel numb")    Home Living Family/patient expects to be discharged to:: Private residence Living Arrangements: Spouse/significant other Available Help at Discharge: Family;Available 24 hours/day (Spouse will be there the rest of the week) Type of Home: House Home Access: Stairs to enter Entrance Stairs-Rails: Right Entrance Stairs-Number of Steps: 6 Home Layout: One level Home Equipment: Cane - single point      Prior Function Level of Independence: Independent               Hand Dominance        Extremity/Trunk Assessment   Upper Extremity Assessment: Defer to OT evaluation           Lower Extremity Assessment: Generalized weakness         Communication   Communication: No difficulties  Cognition Arousal/Alertness: Lethargic;Suspect due to medications Behavior During Therapy: [facility] for tasks assessed/performed Overall Cognitive Status: Within Functional Limits for tasks assessed  General Comments      Exercises        Assessment/Plan    PT Assessment Patient needs continued PT services  PT Diagnosis Difficulty walking   PT Problem List Decreased strength;Impaired sensation;Decreased activity tolerance;Decreased mobility;Decreased knowledge of precautions;Decreased balance;Decreased knowledge of use of DME  PT Treatment Interventions  Balance training;Gait training;Stair training;Functional mobility training;Therapeutic activities;Therapeutic exercise;Patient/family education;DME instruction   PT Goals (Current goals can be found in the Care Plan section) Acute Rehab PT Goals Patient Stated Goal: to lessen my pain PT Goal Formulation: With patient Time For Goal Achievement: 02/23/15 Potential to Achieve Goals: Fair    Frequency Min 5X/week   Barriers to discharge        Co-evaluation               End of Session Equipment Utilized During Treatment: Gait belt Activity Tolerance: Patient tolerated treatment well Patient left: in bed;with call bell/phone within reach;with bed alarm set Nurse Communication: Mobility status         Time: 1610-9604 PT Time Calculation (min) (ACUTE ONLY): 20 min   Charges:   PT Evaluation $Initial PT Evaluation Tier I: 1 Procedure     PT G Codes:        Arsenio Schnorr A Nyeemah Jennette 02/09/2015, 4:55 PM  Mylo Red, PT, DPT [phone]

## 2015-02-09 NOTE — Anesthesia Procedure Notes (Signed)
Procedure Name: Intubation Date/Time: 02/09/2015 9:21 AM Performed by: Orvilla Fus A Pre-anesthesia Checklist: Patient identified, Timeout performed, Emergency Drugs available, Suction available and Patient being monitored Patient Re-evaluated:Patient Re-evaluated prior to inductionOxygen Delivery Method: Circle system utilized Preoxygenation: Pre-oxygenation with 100% oxygen Intubation Type: IV induction Ventilation: Mask ventilation without difficulty and Oral airway inserted - appropriate to patient size Laryngoscope Size: Mac and 3 Grade View: Grade I Tube type: Oral Tube size: 7.0 mm Number of attempts: 1 Airway Equipment and Method: Stylet Placement Confirmation: ETT inserted through vocal cords under direct vision,  breath sounds checked- equal and bilateral and positive ETCO2 Secured at: 19 cm Tube secured with: Tape Dental Injury: Teeth and Oropharynx as per pre-operative assessment  Comments: Cords visualized with slight cricoid pressure.

## 2015-02-09 NOTE — Anesthesia Preprocedure Evaluation (Addendum)
Anesthesia Evaluation  Patient identified by MRN, date of birth, ID band Patient awake    Reviewed: Allergy & Precautions, H&P , NPO status , Patient's Chart, lab work & pertinent test results  Airway Mallampati: III  TM Distance: >3 FB Neck ROM: Full    Dental no notable dental hx. (+) Teeth Intact, Dental Advisory Given   Pulmonary neg pulmonary ROS, COPD COPD inhaler, Current Smoker,  breath sounds clear to auscultation  Pulmonary exam normal       Cardiovascular hypertension, Pt. on medications negative cardio ROS  Rhythm:Regular Rate:Normal     Neuro/Psych  Headaches, Anxiety Depression negative neurological ROS  negative psych ROS   GI/Hepatic negative GI ROS, Neg liver ROS,   Endo/Other  negative endocrine ROS  Renal/GU negative Renal ROS  negative genitourinary   Musculoskeletal  (+) Arthritis -, Osteoarthritis,    Abdominal   Peds  Hematology negative hematology ROS (+)   Anesthesia Other Findings   Reproductive/Obstetrics negative OB ROS                            Anesthesia Physical Anesthesia Plan  ASA: III  Anesthesia Plan: General   Post-op Pain Management:    Induction: Intravenous  Airway Management Planned: Oral ETT  Additional Equipment:   Intra-op Plan:   Post-operative Plan: Extubation in OR  Informed Consent: I have reviewed the patients History and Physical, chart, labs and discussed the procedure including the risks, benefits and alternatives for the proposed anesthesia with the patient or authorized representative who has indicated his/her understanding and acceptance.   Dental advisory given  Plan Discussed with: CRNA  Anesthesia Plan Comments:         Anesthesia Quick Evaluation

## 2015-02-09 NOTE — Progress Notes (Signed)
   02/09/15 1402  Vitals  Temp 98.9 F (37.2 C)  Temp Source Oral  BP (!) 142/77 mmHg  BP Location Left Arm  BP Method Automatic  Patient Position (if appropriate) Lying  Pulse Rate 95  Pulse Rate Source Dinamap  Resp 16  Oxygen Therapy  SpO2 98 %  O2 Device Nasal Cannula  FiO2 (%) (!) 2 %  Pain Assessment  Pain Assessment 0-10  Pain Score 7  Pain Type Surgical pain  Pain Location Back  Pain Orientation Mid  Pain Descriptors / Indicators Aching  Pain Frequency Constant  Pain Onset On-going  Patients Stated Pain Goal 0  Pain Intervention(s) RN made aware  Pt arrived to 5C12 at 1402.  Pt A&O x 4, c/o 7/10 mid back surgical pain, site covered with CDI gauze/ABD dressing, no drains.   Pt V/S taken, pt still on O2 from PACU, fluids running at 75 cc/hr.  No Foley present, pt DTV. Pt without distress.  Family at the bedside. Diet ordered, will monitor.

## 2015-02-09 NOTE — Transfer of Care (Signed)
Immediate Anesthesia Transfer of Care Note  Patient: Brenda Rice  Procedure(s) Performed: Procedure(s) with comments: Right Lumbar three- four, Lumbar four- five Laminectomy (N/A) - L3-4 L4-5 Laminectomy  Patient Location: PACU  Anesthesia Type:General  Level of Consciousness: awake, alert  and oriented  Airway & Oxygen Therapy: Patient Spontanous Breathing and Patient connected to nasal cannula oxygen  Post-op Assessment: Report given to RN, Post -op Vital signs reviewed and stable and Patient moving all extremities  Post vital signs: Reviewed and stable  Last Vitals:  Filed Vitals:   02/09/15 0731  BP: 157/78  Pulse: 101  Temp: 36.9 C  Resp: 18    Complications: No apparent anesthesia complications

## 2015-02-10 ENCOUNTER — Encounter (HOSPITAL_COMMUNITY): Payer: Self-pay | Admitting: Neurosurgery

## 2015-02-10 NOTE — Evaluation (Signed)
Occupational Therapy Evaluation Patient Details Name: Brenda Rice MRN: [id number] DOB: 11-Jul-1955 Today's Date: 02/10/2015    History of Present Illness Patient is a 59 y/o female s/p L3-4 L4-5 Laminectomy. PMH includes HTN, suicide attempt x3, UTI, depression, anxiety.   Clinical Impression   Patient evaluated by Occupational Therapy with no further acute OT needs identified. All education has been completed and the patient has no further questions. See below for any follow-up Occupational Therapy or equipment needs. OT to sign off. Thank you for referral.      Follow Up Recommendations  No OT follow up    Equipment Recommendations  None recommended by OT    Recommendations for Other Services       Precautions / Restrictions Precautions Precautions: Back Precaution Booklet Issued: Yes (comment) Precaution Comments: Reviewed 3/3 back precautions. handout provided with additional comments about medication management / lifting restrictions Required Braces or Orthoses: Spinal Brace (in agreement to wear lumbar corsett) Spinal Brace: Lumbar corset (will have husband bring in brace) Restrictions Weight Bearing Restrictions: No      Mobility Bed Mobility Overal bed mobility: Needs Assistance Bed Mobility: Rolling;Sidelying to Sit;Sit to Sidelying Rolling: Supervision Sidelying to sit: Supervision       General bed mobility comments: VCs for positioning and technique for log roll.  Transfers Overall transfer level: Needs assistance Equipment used: None Transfers: Sit to/from Stand Sit to Stand: Supervision         General transfer comment: No physical assist required, some noted pain upon coming to standing     Balance Overall balance assessment: Needs assistance Sitting-balance support: No upper extremity supported;Feet supported Sitting balance-Leahy Scale: Good     Standing balance support: No upper extremity supported;During functional activity Standing  balance-Leahy Scale: Good Standing balance comment: no UE support required                            ADL Overall ADL's : Modified independent                                       General ADL Comments: pt demonstrates LB dressing by cross bil LE, toilet transfer, bed transfer and basic transfer sit<>stand     Vision Vision Assessment?: No apparent visual deficits   Perception     Praxis      Pertinent Vitals/Pain Pain Assessment: No/denies pain     Hand Dominance Left   Extremity/Trunk Assessment Upper Extremity Assessment Upper Extremity Assessment: Overall WFL for tasks assessed   Lower Extremity Assessment Lower Extremity Assessment: Defer to PT evaluation   Cervical / Trunk Assessment Cervical / Trunk Assessment: Other exceptions (back surg)   Communication Communication Communication: No difficulties   Cognition Arousal/Alertness: Awake/alert Behavior During Therapy: WFL for tasks assessed/performed Overall Cognitive Status: Within Functional Limits for tasks assessed                     General Comments       Exercises       Shoulder Instructions      Home Living Family/patient expects to be discharged to:: Private residence Living Arrangements: Spouse/significant other Available Help at Discharge: Family;Available 24 hours/day Type of Home: House Home Access: Stairs to enter [employer] of Steps: 6 Entrance Stairs-Rails: Right Home Layout: One level     Bathroom Shower/Tub: Tub/shower unit  Bathroom Toilet: Standard     Home Equipment: [employer] - single point   Additional Comments: drives      Prior Functioning/Environment Level of Independence: Independent             OT Diagnosis: Generalized weakness   OT Problem List:     OT Treatment/Interventions:      OT Goals(Current goals can be found in the care plan section) Acute Rehab OT Goals Patient Stated Goal: to go home and  husband to help me this weekend  OT Frequency:     Barriers to D/C:            Co-evaluation              End of Session Nurse Communication: Mobility status;Precautions  Activity Tolerance: Patient tolerated treatment well Patient left: in chair;with call bell/phone within reach   Time: 1133-1149 OT Time Calculation (min): 16 min Charges:  OT General Charges $OT Visit: 1 Procedure OT Evaluation $Initial OT Evaluation Tier I: 1 Procedure G-Codes: OT G-codes **NOT FOR INPATIENT CLASS** Functional Assessment Tool Used: clinical judgement Functional Limitation: Self care Self Care Current Status (W4132): At least 1 percent but less than 20 percent impaired, limited or restricted Self Care Goal Status (G4010): At least 1 percent but less than 20 percent impaired, limited or restricted Self Care Discharge Status [phone]): At least 1 percent but less than 20 percent impaired, limited or restricted  Harolyn Rutherford 02/10/2015, 1:59 PM   Mateo Flow   OTR/L Pager: [phone] Office: [phone] .

## 2015-02-10 NOTE — Discharge Summary (Signed)
Physician Discharge Summary  Patient ID: MIRZA KIDNEY MRN: [id number] DOB/AGE: 02-May-1956 59 y.o.  Admit date: 02/09/2015 Discharge date: 02/10/2015  Admission Diagnoses:lumbar stenosis  Discharge Diagnoses:  Active Problems:   Lumbar foraminal stenosis   Discharged Condition: incisional pain  Hospital Course: [place]  Consults: none  Significant Diagnostic Studies: myelogram Treatments: right laminectomies 34, 45  Discharge Exam: Blood pressure 143/72, pulse 80, temperature 98.5 F (36.9 C), temperature source Oral, resp. rate 18, height  (1.575 m), weight 75.796 kg (167 lb 1.6 oz), SpO2 95 %. Incisional pain  Disposition: home. Insurance did approve only overnight stay. To see me in 2 weeks     Medication List    ASK your doctor about these medications        ALPRAZolam 1 MG tablet  Commonly known as:  XANAX  Take 1 tablet (1 mg total) by mouth 2 (two) times daily. May take an additional 0.5 mg once a day if needed     aspirin-acetaminophen-caffeine 250-250-65 MG per tablet  Commonly known as:  EXCEDRIN MIGRAINE  Take 2 tablets by mouth every 6 (six) hours as needed for headache or migraine.     citalopram 20 MG tablet  Commonly known as:  CELEXA  Take 1 tablet (20 mg total) by mouth daily.     CLEAR EYES COMPLETE OP  Place 1 drop into both eyes daily as needed (dry eyes).     HYDROcodone-acetaminophen 10-325 MG per tablet  Commonly known as:  NORCO  Take 1 tablet by mouth every 8 (eight) hours as needed. May fill on 01/24/2015     lisinopril 40 MG tablet  Commonly known as:  PRINIVIL,ZESTRIL  TAKE 1 TABLET BY MOUTH EVERY DAY FOR BLOOD PRESSURE CONTROL     ondansetron 4 MG disintegrating tablet  Commonly known as:  ZOFRAN ODT   ODT q4 hours prn nausea/vomit     promethazine 25 MG tablet  Commonly known as:  PHENERGAN  Take 1 tablet (25 mg total) by mouth every 8 (eight) hours as needed for nausea or vomiting.     VENTOLIN HFA 108 (90 BASE)  MCG/ACT inhaler  Generic drug:  albuterol  INHALE 2 PUFFS INTO THE LUNGS EVERY 6 (SIX) HOURS AS NEEDED FOR WHEEZING.         Signed: Karn Cassis 02/10/2015, 3:00 PM

## 2015-02-10 NOTE — Op Note (Signed)
NAMEKARALEE, HAUTER NO.:  [id number]  MEDICAL RECORD NO.:  [id number]  LOCATION:  5C12C                        FACILITY:  [facility]  PHYSICIAN:  Hilda Lias, M.D.   DATE OF BIRTH:  04-24-1956  DATE OF PROCEDURE:  02/09/2015 DATE OF DISCHARGE:                              OPERATIVE REPORT   PREOPERATIVE DIAGNOSES:  Right L3-L4, L4-L5 stenosis with radiculopathy, scoliosis, degenerative disc disease.  POSTOPERATIVE DIAGNOSES:  Right L3-L4, L4-L5 stenosis with radiculopathy, scoliosis, degenerative disc disease.  PROCEDURES:  Right L3-L4 and partial L5 hemilaminectomy.  Foraminotomy to decompress the L3, L4, and L5 nerve roots.  SURGEON:  Hilda Lias, M.D.  ASSISTANT:  Dr. Diddi.  CLINICAL HISTORY:  Ms. Mantione is a lady who was seen in my office with complaint of back pain, radiating to the right leg.  She has back pain, but no left leg pain.  She had failed conservative treatment.  Myelogram showed that she has a degenerative disk disease at multiple levels with some step-off which did not move between flexion and extension.  She has a stenosis at the L3-4, L4-5.  She has no pain in the left leg.  Surgery was advised, and the patient knew the risk with the surgery including the possibility of further surgery which might include fusion.  The findings are bilaterally, but her main complaint is only right leg pain. She has a weakness of the quadriceps and dorsiflexion of the right foot.  DESCRIPTION OF PROCEDURE:  The patient was taken to the OR.  After intubation, she was positioned in a prone manner.  The back was cleaned with DuraPrep.  Midline incision was made from L3 down to L4-L5 and muscle retracted laterally.  Immediately what we found was the canal was quite vertical.  The patient had quite a bit of hypertrophy of the facet.  We did an x-ray which showed that indeed we were right at the top of L3.  Using the drill with the help of the microscope,  we started doing a hemilaminectomy of L3, L4, and L5 with removal of thick calcified yellow ligament.  We identified the thecal sac.  Then, using the 1, 2, and 3 mm Kerrison punches as well as the drill, we decompressed the lateral aspect of the thecal sac with decompression of the L3, L4, and L5 nerve roots. At the end, we had good decompression.  We avoided to get into the facet, but nevertheless, they were quite large.  Valsalva maneuver twice up to 40 was negative.  Then, fentanyl and Depo-Medrol were left in the epidural space and the wound was closed with Vicryl and Steri-Strips.          ______________________________ Hilda Lias, M.D.     EB/MEDQ  D:  02/09/2015  T:  02/10/2015  Job:  [id number]

## 2015-02-10 NOTE — Progress Notes (Signed)
Patient ID: Brenda Rice, female   DOB: 09/02/1955, 59 y.o.   MRN: [id number] Stable. Was oob with help. C/o incisional pain. Social worker to find out about length of stay with her insurance company. Pt to advise about a brace

## 2015-02-10 NOTE — Progress Notes (Signed)
Utilization review completed. Dajuana Palen, RN, BSN. 

## 2015-02-10 NOTE — Progress Notes (Signed)
Patient is discharged from room 5C12 at this time. Alert and in stable condition. IV site d/c'd. Instructions read to patients and understanding verbalized. Left unit via wheelchair with husband and all belongings at side.

## 2015-02-10 NOTE — Progress Notes (Signed)
Physical Therapy Treatment Patient Details Name: Brenda Rice MRN: [id number] DOB: Feb 07, 1956 Today's Date: 02/10/2015    History of Present Illness Patient is a 59 y/o female s/p L3-4 L4-5 Laminectomy. PMH includes HTN, suicide attempt x3, UTI, depression, anxiety.    PT Comments    Patient progressing towards mobility as indicated below. Ambulated in halls without device, performed stair negotiation and reviewed precautions extensively with patient. Spoke with patient at length regarding use of back brace. Patient and therapist in agreement that brace would be beneficial at this time. Patient will have spouse bring in. Will continue to see and progress as tolerated.   Follow Up Recommendations  Supervision for mobility/OOB;No PT follow up     Equipment Recommendations  None recommended by PT    Recommendations for Other Services OT consult     Precautions / Restrictions Precautions Precautions: Back Precaution Booklet Issued: Yes (comment) Precaution Comments: Reviewed 3/3 back precautions. Required Braces or Orthoses: Spinal Brace (in agreement to wear lumbar corsett) Spinal Brace: Lumbar corset (will have husband bring in brace) Restrictions Weight Bearing Restrictions: No    Mobility  Bed Mobility Overal bed mobility: Needs Assistance Bed Mobility: Rolling;Sidelying to Sit;Sit to Sidelying Rolling: Supervision Sidelying to sit: Supervision       General bed mobility comments: VCs for positioning and technique for log roll.  Transfers Overall transfer level: Needs assistance Equipment used: None Transfers: Sit to/from Stand Sit to Stand: Supervision         General transfer comment: No physical assist required, some noted pain upon coming to standing   Ambulation/Gait Ambulation/Gait assistance: Supervision Ambulation Distance (Feet): 380 Feet Assistive device: None Gait Pattern/deviations: Step-through pattern;Decreased stride length Gait velocity:  initially decreased, improved with distance Gait velocity interpretation: Below normal speed for age/gender General Gait Details: decreased gait speed, VCs for increased cadence as well as relaxation and normal arm swing. Patient improved stability with increased distance   Stairs Stairs: Yes Stairs assistance: Min guard Stair Management: One rail Left;Forwards Number of Stairs: 6 General stair comments: Slow and methodic, step to pattern with good use of hand rail  Wheelchair Mobility    Modified Rankin (Stroke Patients Only)       Balance   Sitting-balance support: Feet supported Sitting balance-Leahy Scale: Fair     Standing balance support: During functional activity Standing balance-Leahy Scale: Fair Standing balance comment: no UE support required                    Cognition Arousal/Alertness: Awake/alert Behavior During Therapy: WFL for tasks assessed/performed Overall Cognitive Status: Within Functional Limits for tasks assessed                      Exercises      General Comments General comments (skin integrity, edema, etc.): spoke with patient at length regarding use of back brace and mobility concerns. Educated regarding precautions and reviewed handout verbally with patient. patient receptive.      Pertinent Vitals/Pain  Patient reports pain at 5/10 Sore Low back Monitored during session and repositioned    Home Living Family/patient expects to be discharged to:: Private residence Living Arrangements: Spouse/significant other                  Prior Function            PT Goals (current goals can now be found in the care plan section) Acute Rehab PT Goals Patient Stated Goal: to lessen  my pain PT Goal Formulation: With patient Time For Goal Achievement: 02/23/15 Potential to Achieve Goals: Good Progress towards PT goals: Progressing toward goals    Frequency  Min 5X/week    PT Plan Current plan remains  appropriate    Co-evaluation             End of Session Equipment Utilized During Treatment: Gait belt Activity Tolerance: Patient tolerated treatment well Patient left: in bed;with call bell/phone within reach (sitting EOB)     Time: 1610-9604 PT Time Calculation (min) (ACUTE ONLY): 23 min  Charges:  $Gait Training: 8-22 mins $Self Care/Home Management: 8-22                    G CodesFabio Asa 02-22-15, 1:45 PM Charlotte Crumb, PT DPT  [phone]

## 2015-02-11 ENCOUNTER — Other Ambulatory Visit: Payer: Self-pay

## 2015-02-11 DIAGNOSIS — F330 Major depressive disorder, recurrent, mild: Secondary | ICD-10-CM

## 2015-02-11 MED ORDER — CITALOPRAM HYDROBROMIDE 20 MG PO TABS: 20.0000 mg | ORAL_TABLET | Freq: Every day | ORAL | Status: DC

## 2015-02-28 ENCOUNTER — Ambulatory Visit (INDEPENDENT_AMBULATORY_CARE_PROVIDER_SITE_OTHER): Payer: Managed Care, Other (non HMO) | Admitting: Physician Assistant

## 2015-02-28 VITALS — BP 142/82 | HR 95 | Temp 98.8°F | Resp 20 | Ht 62.0 in | Wt 156.0 lb

## 2015-02-28 DIAGNOSIS — K529 Noninfective gastroenteritis and colitis, unspecified: Secondary | ICD-10-CM | POA: Diagnosis not present

## 2015-02-28 DIAGNOSIS — R112 Nausea with vomiting, unspecified: Secondary | ICD-10-CM | POA: Diagnosis not present

## 2015-02-28 LAB — COMPREHENSIVE METABOLIC PANEL
ALBUMIN: 4.3 g/dL (ref 3.6–5.1)
ALT: 22 U/L (ref 6–29)
AST: 29 U/L (ref 10–35)
Alkaline Phosphatase: 69 U/L (ref 33–130)
BILIRUBIN TOTAL: 1 mg/dL (ref 0.2–1.2)
BUN: 12 mg/dL (ref 7–25)
CHLORIDE: 77 mmol/L — AB (ref 98–110)
CO2: 30 mmol/L (ref 20–31)
CREATININE: 0.68 mg/dL (ref 0.50–0.99)
Calcium: 9.5 mg/dL (ref 8.6–10.4)
Glucose, Bld: 89 mg/dL (ref 65–99)
Potassium: 3.5 mmol/L (ref 3.5–5.3)
SODIUM: 124 mmol/L — AB (ref 135–146)
TOTAL PROTEIN: 7.4 g/dL (ref 6.1–8.1)

## 2015-02-28 LAB — POCT CBC
Granulocyte percent: 64.9 %G (ref 37–80)
HEMATOCRIT: 45.5 % (ref 37.7–47.9)
HEMOGLOBIN: 14.1 g/dL (ref 12.2–16.2)
LYMPH, POC: 2.2 (ref 0.6–3.4)
MCH, POC: 30.7 pg (ref 27–31.2)
MCHC: 31 g/dL — AB (ref 31.8–35.4)
MCV: 98.9 fL — AB (ref 80–97)
MID (cbc): 1.4 — AB (ref 0–0.9)
MPV: 7.8 fL (ref 0–99.8)
POC GRANULOCYTE: 6.6 (ref 2–6.9)
POC LYMPH %: 21.7 % (ref 10–50)
POC MID %: 13.4 %M — AB (ref 0–12)
Platelet Count, POC: 154 10*3/uL (ref 142–424)
RBC: 4.6 M/uL (ref 4.04–5.48)
RDW, POC: 14 %
WBC: 10.2 10*3/uL (ref 4.6–10.2)

## 2015-02-28 MED ORDER — ONDANSETRON HCL 4 MG PO TABS: 4.0000 mg | ORAL_TABLET | Freq: Three times a day (TID) | ORAL | Status: DC | PRN

## 2015-02-28 MED ORDER — ONDANSETRON 4 MG PO TBDP: 4.0000 mg | ORAL_TABLET | Freq: Once | ORAL | Status: DC

## 2015-02-28 NOTE — Progress Notes (Signed)
Subjective:    Patient ID: Brenda Rice, female    DOB: 07/04/1955, 59 y.o.   MRN: [id number]  HPI Patient presents with husband for abdominal pain that has been present for the past 3 days. Abdominal pain is constant and is generalized. Pain accompanied with nausea, vomiting, and diarrhea. Vomiting has been worse for past 2 days and has been mostly water as that is all that she has had. Has not had any diarrhea episodes today. States that she has had sweats and chills, but has not checked temperature. Recovering from spinal surgery and discontinued oxycodone that she was taking for pain. Had follow up appt yesterday and was cleared to return to work. Does not recall anyone in the office being sick. Denies having undercooked food and has not eaten out. Has not eaten for past 3 days. Can only keep room temp water down. Vomited when drank pedialyte. Denies urinary sx, but states that has had mild cough for past 2 days. Denies abdominal surgery.  Med allergy: Celexa  Review of Systems  Constitutional: Positive for chills, diaphoresis and appetite change. Negative for fever and unexpected weight change.  HENT: Positive for sore throat (secondary to emesis). Negative for congestion, ear pain, rhinorrhea, sinus pressure and sneezing.   Respiratory: Positive for cough. Negative for shortness of breath and wheezing.   Cardiovascular: Negative for chest pain.  Gastrointestinal: Positive for nausea, vomiting, abdominal pain and diarrhea. Negative for constipation, blood in stool and abdominal distention.  Genitourinary: Negative for dysuria, frequency, hematuria, flank pain, vaginal bleeding, vaginal discharge and pelvic pain.  Musculoskeletal: Positive for back pain (secondary to spine surgery).  Skin: Negative.   Neurological: Positive for headaches. Negative for dizziness and light-headedness.       Objective:   Physical Exam  Constitutional: She is oriented to person, place, and time. She appears  well-developed and well-nourished. No distress.  Blood pressure 142/82, pulse 119 repeat 95, temperature 98.8 F (37.1 C), temperature source Oral, resp. rate 20, height  (1.575 m), weight 156 lb (70.761 kg), SpO2 98 %.  HENT:  Head: Normocephalic and atraumatic.  Right Ear: Tympanic membrane, external ear and ear canal normal.  Left Ear: Tympanic membrane, external ear and ear canal normal.  Nose: Nose normal. Right sinus exhibits no maxillary sinus tenderness and no frontal sinus tenderness. Left sinus exhibits no maxillary sinus tenderness and no frontal sinus tenderness.  Mouth/Throat: Uvula is midline. Mucous membranes are not pale, dry and not cyanotic. No oropharyngeal exudate, posterior oropharyngeal edema, posterior oropharyngeal erythema or tonsillar abscesses.  Eyes: Conjunctivae are normal. Pupils are equal, round, and reactive to light. Right eye exhibits no discharge. Left eye exhibits no discharge. No scleral icterus.  Neck: Normal range of motion. Neck supple. No thyromegaly present.  Cardiovascular: Normal rate, regular rhythm, normal heart sounds and intact distal pulses.  Exam reveals no gallop and no friction rub.   No murmur heard. Pulmonary/Chest: Effort normal and breath sounds normal. No respiratory distress. She has no wheezes. She has no rales.  Abdominal: Soft. Normal appearance and bowel sounds are normal. She exhibits no distension and no mass. There is generalized tenderness. There is no rigidity, no rebound, no guarding, no CVA tenderness, no tenderness at McBurney's point and negative Murphy's sign. No hernia.  Lymphadenopathy:    She has no cervical adenopathy.  Neurological: She is alert and oriented to person, place, and time.  Skin: Skin is warm and dry. No rash noted. She is not diaphoretic.  No erythema. No pallor.  Incision site on back intact and healing well. No erythema or purulence.  Psychiatric: She has a normal mood and affect. Her behavior is  normal. Judgment and thought content normal.   Results for orders placed or performed in visit on 02/28/15  POCT CBC  Result Value Ref Range   WBC 10.2 4.6 - 10.2 K/uL   Lymph, poc 2.2 0.6 - 3.4   POC LYMPH PERCENT 21.7 10 - 50 %L   MID (cbc) 1.4 (A) 0 - 0.9   POC MID % 13.4 (A) 0 - 12 %M   POC Granulocyte 6.6 2 - 6.9   Granulocyte percent 64.9 37 - 80 %G   RBC 4.60 4.04 - 5.48 M/uL   Hemoglobin 14.1 12.2 - 16.2 g/dL   HCT, POC 16.1 09.6 - 47.9 %   MCV 98.9 (A) 80 - 97 fL   MCH, POC 30.7 27 - 31.2 pg   MCHC 31.0 (A) 31.8 - 35.4 g/dL   RDW, POC 04.5 %   Platelet Count, POC 154 142 - 424 K/uL   MPV 7.8 0 - 99.8 fL       Assessment & Plan:  1. Non-intractable vomiting with nausea, vomiting of unspecified type - ondansetron (ZOFRAN-ODT) disintegrating tablet 4 mg; Take 1 tablet (4 mg total) by mouth once. - Comprehensive metabolic panel - POCT CBC  2. Noninfectious gastroenteritis, unspecified Unable to place IV. Tolerated introduction of ice chips to water well. Had 5 cups of water before leaving office. Anticipatory guidance given. - ondansetron (ZOFRAN) 4 MG tablet; Take 1 tablet (4 mg total) by mouth every 8 (eight) hours as needed for nausea or vomiting.  Dispense: 20 tablet; Refill: 0   Natacha Jepsen PA-C  [facility] 02/28/2015 12:26 PM

## 2015-03-01 ENCOUNTER — Emergency Department (HOSPITAL_COMMUNITY): Payer: Managed Care, Other (non HMO)

## 2015-03-01 ENCOUNTER — Encounter: Payer: Self-pay | Admitting: Family Medicine

## 2015-03-01 ENCOUNTER — Encounter (HOSPITAL_COMMUNITY): Payer: Self-pay | Admitting: *Deleted

## 2015-03-01 ENCOUNTER — Inpatient Hospital Stay (HOSPITAL_COMMUNITY): Payer: Managed Care, Other (non HMO)

## 2015-03-01 ENCOUNTER — Telehealth: Payer: Self-pay

## 2015-03-01 ENCOUNTER — Inpatient Hospital Stay (HOSPITAL_COMMUNITY)
Admission: EM | Admit: 2015-03-01 | Discharge: 2015-03-03 | DRG: 392 | Disposition: A | Discharge status: Home / Self Care | Payer: Managed Care, Other (non HMO) | Attending: Internal Medicine | Admitting: Internal Medicine

## 2015-03-01 DIAGNOSIS — R112 Nausea with vomiting, unspecified: Secondary | ICD-10-CM | POA: Diagnosis present

## 2015-03-01 DIAGNOSIS — A084 Viral intestinal infection, unspecified: Secondary | ICD-10-CM | POA: Diagnosis not present

## 2015-03-01 DIAGNOSIS — E861 Hypovolemia: Secondary | ICD-10-CM | POA: Diagnosis present

## 2015-03-01 DIAGNOSIS — J449 Chronic obstructive pulmonary disease, unspecified: Secondary | ICD-10-CM | POA: Diagnosis present

## 2015-03-01 DIAGNOSIS — R197 Diarrhea, unspecified: Secondary | ICD-10-CM

## 2015-03-01 DIAGNOSIS — F1020 Alcohol dependence, uncomplicated: Secondary | ICD-10-CM | POA: Diagnosis present

## 2015-03-01 DIAGNOSIS — E871 Hypo-osmolality and hyponatremia: Secondary | ICD-10-CM | POA: Diagnosis present

## 2015-03-01 DIAGNOSIS — I10 Essential (primary) hypertension: Secondary | ICD-10-CM | POA: Diagnosis present

## 2015-03-01 DIAGNOSIS — F17200 Nicotine dependence, unspecified, uncomplicated: Secondary | ICD-10-CM | POA: Diagnosis present

## 2015-03-01 DIAGNOSIS — F411 Generalized anxiety disorder: Secondary | ICD-10-CM | POA: Diagnosis present

## 2015-03-01 DIAGNOSIS — D696 Thrombocytopenia, unspecified: Secondary | ICD-10-CM | POA: Diagnosis present

## 2015-03-01 DIAGNOSIS — E876 Hypokalemia: Secondary | ICD-10-CM | POA: Diagnosis present

## 2015-03-01 DIAGNOSIS — E860 Dehydration: Secondary | ICD-10-CM | POA: Diagnosis present

## 2015-03-01 HISTORY — DX: Migraine, unspecified, not intractable, without status migrainosus: G43.909

## 2015-03-01 HISTORY — DX: Unspecified osteoarthritis, unspecified site: M19.90

## 2015-03-01 LAB — CBC
HEMATOCRIT: 38.9 % (ref 36.0–46.0)
HEMOGLOBIN: 14.1 g/dL (ref 12.0–15.0)
MCH: 33.7 pg (ref 26.0–34.0)
MCHC: 36.2 g/dL — ABNORMAL HIGH (ref 30.0–36.0)
MCV: 92.8 fL (ref 78.0–100.0)
Platelets: 132 10*3/uL — ABNORMAL LOW (ref 150–400)
RBC: 4.19 MIL/uL (ref 3.87–5.11)
RDW: 12.9 % (ref 11.5–15.5)
WBC: 9.4 10*3/uL (ref 4.0–10.5)

## 2015-03-01 LAB — COMPREHENSIVE METABOLIC PANEL
ALT: 25 U/L (ref 14–54)
ANION GAP: 15 (ref 5–15)
AST: 33 U/L (ref 15–41)
Albumin: 3.7 g/dL (ref 3.5–5.0)
Alkaline Phosphatase: 70 U/L (ref 38–126)
BUN: 6 mg/dL (ref 6–20)
CHLORIDE: 75 mmol/L — AB (ref 101–111)
CO2: 24 mmol/L (ref 22–32)
Calcium: 8.7 mg/dL — ABNORMAL LOW (ref 8.9–10.3)
Creatinine, Ser: 0.63 mg/dL (ref 0.44–1.00)
GFR calc Af Amer: >60 mL/min (ref >60)
Glucose, Bld: 89 mg/dL (ref 65–99)
POTASSIUM: 2.7 mmol/L — AB (ref 3.5–5.1)
Sodium: 114 mmol/L — CL (ref 135–145)
TOTAL PROTEIN: 6.7 g/dL (ref 6.5–8.1)
Total Bilirubin: 1.2 mg/dL (ref 0.3–1.2)

## 2015-03-01 LAB — URINALYSIS, ROUTINE W REFLEX MICROSCOPIC
Bilirubin Urine: NEGATIVE
Glucose, UA: NEGATIVE mg/dL
Hgb urine dipstick: NEGATIVE
Ketones, ur: 15 mg/dL — AB
LEUKOCYTES UA: NEGATIVE
NITRITE: NEGATIVE
PH: 6.5 (ref 5.0–8.0)
Protein, ur: NEGATIVE mg/dL
SPECIFIC GRAVITY, URINE: 1.001 — AB (ref 1.005–1.030)
UROBILINOGEN UA: 0.2 mg/dL (ref 0.0–1.0)

## 2015-03-01 LAB — MAGNESIUM: Magnesium: 1.4 mg/dL — ABNORMAL LOW (ref 1.7–2.4)

## 2015-03-01 LAB — LIPASE, BLOOD: LIPASE: 24 U/L (ref 22–51)

## 2015-03-01 LAB — PHOSPHORUS: Phosphorus: 2.5 mg/dL (ref 2.5–4.6)

## 2015-03-01 MED ORDER — ACETAMINOPHEN 325 MG PO TABS: 650.0000 mg | ORAL_TABLET | Freq: Four times a day (QID) | ORAL | Status: DC | PRN

## 2015-03-01 MED ORDER — HYDRALAZINE HCL 20 MG/ML IJ SOLN
5.0000 mg | Freq: Four times a day (QID) | INTRAMUSCULAR | Status: DC | PRN
  Administered 2015-03-02: 5 mg via INTRAVENOUS
  Filled 2015-03-01: qty 1

## 2015-03-01 MED ORDER — ENSURE ENLIVE PO LIQD
237.0000 mL | Freq: Two times a day (BID) | ORAL | Status: DC
  Administered 2015-03-02 (×2): 237 mL via ORAL

## 2015-03-01 MED ORDER — POTASSIUM CHLORIDE IN NACL 20-0.9 MEQ/L-% IV SOLN
INTRAVENOUS | Status: DC
  Filled 2015-03-01 (×2): qty 1000

## 2015-03-01 MED ORDER — BISACODYL 10 MG RE SUPP: 10.0000 mg | Freq: Every day | RECTAL | Status: DC | PRN

## 2015-03-01 MED ORDER — ALBUTEROL SULFATE (2.5 MG/3ML) 0.083% IN NEBU: 2.5000 mg | INHALATION_SOLUTION | RESPIRATORY_TRACT | Status: DC | PRN

## 2015-03-01 MED ORDER — FOLIC ACID 1 MG PO TABS
1.0000 mg | ORAL_TABLET | Freq: Every day | ORAL | Status: DC
  Administered 2015-03-01 – 2015-03-03 (×3): 1 mg via ORAL
  Filled 2015-03-01 (×3): qty 1

## 2015-03-01 MED ORDER — NICOTINE 21 MG/24HR TD PT24
21.0000 mg | MEDICATED_PATCH | TRANSDERMAL | Status: DC
  Administered 2015-03-01 – 2015-03-02 (×2): 21 mg via TRANSDERMAL
  Filled 2015-03-01 (×2): qty 1

## 2015-03-01 MED ORDER — LORAZEPAM 1 MG PO TABS
1.0000 mg | ORAL_TABLET | Freq: Four times a day (QID) | ORAL | Status: DC | PRN
  Administered 2015-03-02: 1 mg via ORAL
  Filled 2015-03-01: qty 1

## 2015-03-01 MED ORDER — SODIUM CHLORIDE 0.9 % IV SOLN: Freq: Once | INTRAVENOUS | Status: DC

## 2015-03-01 MED ORDER — POTASSIUM CHLORIDE 20 MEQ PO PACK: 80.0000 meq | PACK | Freq: Two times a day (BID) | ORAL | Status: DC

## 2015-03-01 MED ORDER — MAGNESIUM SULFATE IN D5W 10-5 MG/ML-% IV SOLN
1.0000 g | Freq: Once | INTRAVENOUS | Status: AC
  Administered 2015-03-01: 1 g via INTRAVENOUS
  Filled 2015-03-01: qty 100

## 2015-03-01 MED ORDER — LORAZEPAM 2 MG/ML IJ SOLN: 1.0000 mg | Freq: Four times a day (QID) | INTRAMUSCULAR | Status: DC | PRN

## 2015-03-01 MED ORDER — POLYETHYLENE GLYCOL 3350 17 G PO PACK
17.0000 g | PACK | Freq: Every day | ORAL | Status: DC | PRN
  Filled 2015-03-01: qty 1

## 2015-03-01 MED ORDER — ONDANSETRON HCL 4 MG/2ML IJ SOLN: 4.0000 mg | Freq: Four times a day (QID) | INTRAMUSCULAR | Status: DC | PRN

## 2015-03-01 MED ORDER — SODIUM CHLORIDE 0.9 % IV BOLUS (SEPSIS)
1000.0000 mL | Freq: Once | INTRAVENOUS | Status: AC
Start: 2015-03-01 — End: 2015-03-01
  Administered 2015-03-01: 1000 mL via INTRAVENOUS

## 2015-03-01 MED ORDER — HEPARIN SODIUM (PORCINE) 5000 UNIT/ML IJ SOLN
5000.0000 [IU] | Freq: Three times a day (TID) | INTRAMUSCULAR | Status: DC
  Administered 2015-03-01 – 2015-03-02 (×2): 5000 [IU] via SUBCUTANEOUS
  Filled 2015-03-01 (×3): qty 1

## 2015-03-01 MED ORDER — ALPRAZOLAM 0.5 MG PO TABS
1.0000 mg | ORAL_TABLET | Freq: Two times a day (BID) | ORAL | Status: DC
  Administered 2015-03-01 – 2015-03-03 (×4): 1 mg via ORAL
  Filled 2015-03-01 (×4): qty 2

## 2015-03-01 MED ORDER — METOCLOPRAMIDE HCL 5 MG/ML IJ SOLN
10.0000 mg | Freq: Once | INTRAMUSCULAR | Status: AC
Start: 2015-03-01 — End: 2015-03-01
  Administered 2015-03-01: 10 mg via INTRAVENOUS
  Filled 2015-03-01: qty 2

## 2015-03-01 MED ORDER — LORAZEPAM 2 MG/ML IJ SOLN
0.0000 mg | Freq: Four times a day (QID) | INTRAMUSCULAR | Status: DC
  Administered 2015-03-01: 2 mg via INTRAVENOUS
  Filled 2015-03-01: qty 1

## 2015-03-01 MED ORDER — POTASSIUM CHLORIDE IN NACL 20-0.9 MEQ/L-% IV SOLN
INTRAVENOUS | Status: AC
  Administered 2015-03-01 – 2015-03-02 (×2): via INTRAVENOUS
  Filled 2015-03-01 (×3): qty 1000

## 2015-03-01 MED ORDER — SODIUM CHLORIDE 0.9 % IJ SOLN
3.0000 mL | Freq: Two times a day (BID) | INTRAMUSCULAR | Status: DC
  Administered 2015-03-01: 10 mL via INTRAVENOUS
  Administered 2015-03-02: 3 mL via INTRAVENOUS
  Administered 2015-03-02: 10 mL via INTRAVENOUS

## 2015-03-01 MED ORDER — HYDROMORPHONE HCL 1 MG/ML IJ SOLN
0.5000 mg | INTRAMUSCULAR | Status: AC | PRN
  Administered 2015-03-02: 0.5 mg via INTRAVENOUS
  Filled 2015-03-01: qty 1

## 2015-03-01 MED ORDER — POTASSIUM CHLORIDE 20 MEQ PO PACK: 80.0000 meq | PACK | Freq: Once | ORAL | Status: DC

## 2015-03-01 MED ORDER — POTASSIUM CHLORIDE CRYS ER 20 MEQ PO TBCR
80.0000 meq | EXTENDED_RELEASE_TABLET | Freq: Once | ORAL | Status: DC
  Filled 2015-03-01: qty 4

## 2015-03-01 MED ORDER — ONDANSETRON HCL 4 MG PO TABS: 4.0000 mg | ORAL_TABLET | Freq: Four times a day (QID) | ORAL | Status: DC | PRN

## 2015-03-01 MED ORDER — ACETAMINOPHEN 650 MG RE SUPP: 650.0000 mg | Freq: Four times a day (QID) | RECTAL | Status: DC | PRN

## 2015-03-01 MED ORDER — VITAMIN B-1 100 MG PO TABS
100.0000 mg | ORAL_TABLET | Freq: Every day | ORAL | Status: DC
Start: 2015-03-01 — End: 2015-03-03
  Administered 2015-03-01 – 2015-03-03 (×3): 100 mg via ORAL
  Filled 2015-03-01 (×3): qty 1

## 2015-03-01 MED ORDER — POTASSIUM CHLORIDE 10 MEQ/100ML IV SOLN
10.0000 meq | Freq: Once | INTRAVENOUS | Status: AC
  Administered 2015-03-01: 10 meq via INTRAVENOUS
  Filled 2015-03-01: qty 100

## 2015-03-01 MED ORDER — THIAMINE HCL 100 MG/ML IJ SOLN: 100.0000 mg | Freq: Every day | INTRAMUSCULAR | Status: DC

## 2015-03-01 MED ORDER — ADULT MULTIVITAMIN W/MINERALS CH
1.0000 | ORAL_TABLET | Freq: Every day | ORAL | Status: DC
  Administered 2015-03-01 – 2015-03-03 (×3): 1 via ORAL
  Filled 2015-03-01 (×3): qty 1

## 2015-03-01 MED ORDER — HYDROCODONE-ACETAMINOPHEN 5-325 MG PO TABS
1.0000 | ORAL_TABLET | ORAL | Status: DC | PRN
  Administered 2015-03-02 – 2015-03-03 (×6): 2 via ORAL
  Filled 2015-03-01 (×6): qty 2

## 2015-03-01 MED ORDER — SODIUM CHLORIDE 0.9 % IV SOLN: INTRAVENOUS | Status: AC

## 2015-03-01 MED ORDER — LORAZEPAM 2 MG/ML IJ SOLN: 0.0000 mg | Freq: Two times a day (BID) | INTRAMUSCULAR | Status: DC

## 2015-03-01 NOTE — H&P (Addendum)
Patient Demographics  Brenda Rice, is a 59 y.o. female  MRN: [id number]   DOB - [birth year]  Admit Date - 03/01/2015  Outpatient Primary MD for the patient is Abbe Amsterdam, MD   With History of -  Past Medical History  Diagnosis Date  . Hypertension   . Suicide attempt     x 3  . Osteoarthritis     right hip  . Fx clavicle     Hx of left clavicle fx  . Hx: UTI (urinary tract infection)   . COPD (chronic obstructive pulmonary disease)   . Depression     pt. reports that she has not taken anything for depression in a couple of months. States she was told not to take Celexa & Seroquel together. Pt. is vague & unsure of her PCP wishes as to what if any antidepressant she should be taking.   . Anxiety   . Headache   . Neuromuscular disorder     spinal stenosis- lumbar       Past Surgical History  Procedure Laterality Date  . Orif left clavicle fracture and pinning 2nd metacarpal  01/03/2010  . Lumbar laminectomy/decompression microdiscectomy N/A 02/09/2015    Procedure: Right Lumbar three- four, Lumbar four- five Laminectomy;  Surgeon: Hilda Lias, MD;  Location: [facility];  Service: Neurosurgery;  Laterality: N/A;  L3-4 L4-5 Laminectomy    in for   Chief Complaint  Patient presents with  . Weakness  . Emesis  . Diarrhea     HPI  Brenda Rice  is a 59 y.o. female, with past medical history of tobacco abuse, alcohol abuse, recent lumbar spine surgery, presents with complaints of nausea, vomiting and diarrhea, patient reports Friday she started to develop mild abdominal pain, nausea and vomiting and diarrhea, reports pain in color liquid 4-5 times per day, reports vomiting more as a dry heaving, denies any coffee-ground emesis, didn't reports mild epigastric pain, denies any coffee-ground emesis, bright red blood per rectum, or melena, so her PCP before 2 days, was mildly hyponatremic at 124, some Zofran without much help, presents to ED today with no improvement of  her symptoms, workup was significant for hyponatremia of 114, hypokalemia of 2.7, hypochloremia with 75, and hypomagnesemia of 1.4, agent is afebrile with no leukocytosis, hospitalist called to admit .    Review of Systems    In addition to the HPI above,  No Fever-chills, No Headache, No changes with Vision or hearing, No problems swallowing food or Liquids, No Chest pain, Cough or Shortness of Breath, Reports Abdominal pain, nausea vomiting and diarrhea  No Blood in stool or Urine, No dysuria, No new skin rashes or bruises, No new joints pains-aches,  No new weakness, tingling, numbness in any extremity, No recent weight gain or loss, No polyuria, polydypsia or polyphagia, No significant Mental Stressors.  A full 10 point Review of Systems was done, except as stated above, all other Review of Systems were negative.   Social History Social History  Substance Use Topics  . Smoking status: Current Every Day Smoker -- 1.50 packs/day for 44 years    Types: Cigarettes  . Smokeless tobacco: Never Used  . Alcohol Use: 0.6 oz/week    1 Cans of beer, 0 Shots of liquor per week     Comment: was drinking beer daily now down to total 1.5 cans a week, quit hard liquor 1 month ago, as of  01/2015- pt. reports that its less than it use  to be but states " I am not an alcoholic"     Family History Family History  Problem Relation Age of Onset  . ADD / ADHD Neg Hx   . Anxiety disorder Neg Hx   . Alcohol abuse Neg Hx   . Bipolar disorder Neg Hx   . Dementia Neg Hx   . Depression Neg Hx   . Drug abuse Neg Hx   . OCD Neg Hx   . Schizophrenia Neg Hx      Prior to Admission medications   Medication Sig Start Date End Date Taking? Authorizing Provider  ALPRAZolam Prudy Feeler) 1 MG tablet Take 1 tablet (1 mg total) by mouth 2 (two) times daily. May take an additional 0.5 mg once a day if needed Patient taking differently: Take 1 mg by mouth 2 (two) times daily. May take an additional 0.5 mg  once a day if needed for anxiety 01/19/15   Pearline Cables, MD  aspirin-acetaminophen-caffeine (EXCEDRIN MIGRAINE) [id number] MG per tablet Take 2 tablets by mouth every 6 (six) hours as needed for headache or migraine.    Historical Provider, MD  HYDROcodone-acetaminophen (NORCO) 10-325 MG per tablet Take 1 tablet by mouth every 8 (eight) hours as needed. May fill on 01/24/2015 Patient taking differently: Take 1 tablet by mouth every 8 (eight) hours as needed for moderate pain.  01/19/15   Gwenlyn Found Copland, MD  Hyprom-Naphaz-Polysorb-Zn Sulf (CLEAR EYES COMPLETE OP) Place 1 drop into both eyes daily as needed (dry eyes).     Historical Provider, MD  lisinopril (PRINIVIL,ZESTRIL) 40 MG tablet TAKE 1 TABLET BY MOUTH EVERY DAY FOR BLOOD PRESSURE CONTROL Patient taking differently: Take 40 mg by mouth daily before breakfast.  01/19/15   Gwenlyn Found Copland, MD  ondansetron (ZOFRAN) 4 MG tablet Take 1 tablet (4 mg total) by mouth every 8 (eight) hours as needed for nausea or vomiting. 02/28/15   Tishira R Brewington, PA-C  VENTOLIN HFA 108 (90 BASE) MCG/ACT inhaler INHALE 2 PUFFS INTO THE LUNGS EVERY 6 (SIX) HOURS AS NEEDED FOR WHEEZING. Patient taking differently: INHALE 3 PUFFS INTO THE LUNGS EVERY 6 (SIX) HOURS AS NEEDED FOR WHEEZING. 08/11/14   Pearline Cables, MD    Allergies  Allergen Reactions  . Celexa [Citalopram Hydrobromide] Other (See Comments)    Hyponatremia thought due to SSRI    Physical Exam  Vitals  Blood pressure 164/74, pulse 89, temperature 98.4 F (36.9 C), temperature source Oral, resp. rate 16, height 5\' 2"  (1.575 m), weight 69.854 kg (154 lb), SpO2 98 %.   1. General well-nourished female lying in bed in NAD,    2. Normal affect and insight, Not Suicidal or Homicidal, Awake Alert, Oriented X 3.  3. No F.N deficits, ALL C.Nerves Intact, Strength 5/5 all 4 extremities, Sensation intact all 4 extremities, Plantars down going.  4. Ears and Eyes appear Normal, Conjunctivae  clear, PERRLA. Moist Oral Mucosa.  5. Supple Neck, No JVD, No cervical lymphadenopathy appriciated, No Carotid Bruits.  6. Symmetrical Chest wall movement, Good air movement bilaterally, CTAB.  7. RRR, No Gallops, Rubs or Murmurs, No Parasternal Heave.  8. Positive Bowel Sounds, Abdomen Soft, no tenderness in epigastric area, No organomegaly appriciated,No rebound -guarding or rigidity.  9.  No Cyanosis, Normal Skin Turgor, No Skin Rash or Bruise.  10. Good muscle tone,  joints appear normal , no effusions, Normal ROM.  11. No Palpable Lymph Nodes in Neck or Axillae    Data Review  CBC  Recent Labs Lab 02/28/15 1202 03/01/15 1610  WBC 10.2 9.4  HGB 14.1 14.1  HCT 45.5 38.9  PLT  --  132*  MCV 98.9* 92.8  MCH 30.7 33.7  MCHC 31.0* 36.2*  RDW  --  12.9   ------------------------------------------------------------------------------------------------------------------  Chemistries   Recent Labs Lab 02/28/15 1200 03/01/15 1610 03/01/15 1711  NA 124* 114*  --   K 3.5 2.7*  --   CL 77* 75*  --   CO2 30 24  --   GLUCOSE 89 89  --   BUN 12 6  --   CREATININE 0.68 0.63  --   CALCIUM 9.5 8.7*  --   MG  --   --  1.4*  AST 29 33  --   ALT 22 25  --   ALKPHOS 69 70  --   BILITOT 1.0 1.2  --    ------------------------------------------------------------------------------------------------------------------ estimated creatinine clearance is 67.6 mL/min (by C-G formula based on Cr of 0.63). ------------------------------------------------------------------------------------------------------------------ No results for input(s): TSH, T4TOTAL, T3FREE, THYROIDAB in the last 72 hours.  Invalid input(s): FREET3   Coagulation profile No results for input(s): INR, PROTIME in the last 168 hours. ------------------------------------------------------------------------------------------------------------------- No results for input(s): DDIMER in the last 72  hours. -------------------------------------------------------------------------------------------------------------------  Cardiac Enzymes No results for input(s): CKMB, TROPONINI, MYOGLOBIN in the last 168 hours.  Invalid input(s): CK ------------------------------------------------------------------------------------------------------------------ Invalid input(s): POCBNP   ---------------------------------------------------------------------------------------------------------------  Urinalysis    Component Value Date/Time   COLORURINE YELLOW 01/05/2015 0800   APPEARANCEUR CLEAR 01/05/2015 0800   LABSPEC 1.006 01/05/2015 0800   PHURINE 8.0 01/05/2015 0800   GLUCOSEU NEGATIVE 01/05/2015 0800   HGBUR NEGATIVE 01/05/2015 0800   BILIRUBINUR NEGATIVE 01/05/2015 0800   BILIRUBINUR neg 03/10/2014 1718   KETONESUR NEGATIVE 01/05/2015 0800   PROTEINUR NEGATIVE 01/05/2015 0800   PROTEINUR neg 03/10/2014 1718   UROBILINOGEN 0.2 01/05/2015 0800   UROBILINOGEN 0.2 03/10/2014 1718   NITRITE NEGATIVE 01/05/2015 0800   NITRITE neg 03/10/2014 1718   LEUKOCYTESUR TRACE* 01/05/2015 0800    ----------------------------------------------------------------------------------------------------------------  Imaging results:   Dg Chest 2 View  03/01/2015   CLINICAL DATA:  59 year old female with epigastric pain nausea and vomiting  EXAM: CHEST  2 VIEW  COMPARISON:  Chest radiograph dated 01/05/2015 the  FINDINGS: The heart size and mediastinal contours are within normal limits. Both lungs are clear. Old healed fracture of the lateral left clavicle and left ribs.  IMPRESSION: No active cardiopulmonary disease.   Electronically Signed   By: Elgie Collard M.D.   On: 03/01/2015 18:17        Assessment & Plan  Active Problems:   Hypertension   Nicotine addiction   Hyponatremia   Alcohol dependence syndrome   COPD (chronic obstructive pulmonary disease)   GAD (generalized anxiety  disorder)   Hypokalemia   Hypomagnesemia    Nausea vomiting and diarrhea - This is most likely related to gastroenteritis, patient afebrile, with no leukocytosis, most likely it's viral, will hold on starting antibiotics, check GI pathogen, will check C. Difficile.  Hypokalemia, hypomagnesemia - Secondary to diarrhea and vomiting, monitor on telemetry - Will replace, recheck in a.m.  Hyponatremia and hypochloremia - Secondary to volume depletion from gastroenteritis, will start patient on IV normal saline, recheck in a.m.  Dehydration - Continue with IV fluids  Hypertension - Hold lisinopril, will start on when necessary hydralazine  Tobacco abuse - Counseled, started on nicotine patch  Alcohol abuse - On CIWA protocol  COPD - Stable, no active wheezing  Generalized anxiety disorder - Continue home medication  DVT Prophylaxis Heparin -  AM Labs Ordered, also please review Full Orders  Family Communication: Admission, patients condition and plan of care including tests being ordered have been discussed with the patient  who indicate understanding and agree with the plan and Code Status.  Code Status full  Likely DC to home  Condition GUARDED   Time spent in minutes : 55 minutes    Katelynne Revak M.D on 03/01/2015 at 6:50 PM  Between 7am to 7pm - Pager - [phone]  After 7pm go to [email or web address] - password [email or web address]  And look for the night coverage person covering me after hours  [facility] Office  [phone]

## 2015-03-01 NOTE — ED Notes (Signed)
Dr. Truddie Hidden advised she now wants normal saline to run at an hour.

## 2015-03-01 NOTE — ED Notes (Signed)
Second iv started, potassium moved to second iv of larger size to see it patient could tolerate it.

## 2015-03-01 NOTE — ED Notes (Signed)
MD at bedside. 

## 2015-03-01 NOTE — ED Notes (Signed)
Lab contacted to see if they could do an add on for phosphorous and mag, advised that they could add it on to the blood work they already have

## 2015-03-01 NOTE — ED Provider Notes (Addendum)
CSN: [id number]     Arrival date & time 03/01/15  1538 History   First MD Initiated Contact with Patient 03/01/15 1620     Chief Complaint  Patient presents with  . Weakness  . Emesis  . Diarrhea     (Consider location/radiation/quality/duration/timing/severity/associated sxs/prior Treatment) HPI 59 year old female who presents with nausea, vomiting, and diarrhea. History of hypertension and recent surgery for lumbar stenosis. No prior history of abdominal surgeries. Reports 4-5 days of intractable nausea, vomiting, and diarrhea. She has had over 5 episodes of watery green stools with vomiting over the past 24 hours and unable to tolerate oral intake despite taking Zofran ODT's. Has been having associated epigastric abdominal pain with her symptoms. Denies any fevers, chills, but has been feeling sweaty. Has also had cough, and cold-like symptoms in the setting. Unknown sick contacts, but reports that at her doctor's office there were multiple people that appeared to be sick just prior to onset of symptoms. Saw primary care provider yesterday, and had blood work that was concerning for dehydration cystic to ED for evaluation today. Denies chest pain, difficulty breathing, syncope.   Past Medical History  Diagnosis Date  . Hypertension   . Suicide attempt     x 3  . Osteoarthritis     right hip  . Fx clavicle     Hx of left clavicle fx  . Hx: UTI (urinary tract infection)   . COPD (chronic obstructive pulmonary disease)   . Depression     pt. reports that she has not taken anything for depression in a couple of months. States she was told not to take Celexa & Seroquel together. Pt. is vague & unsure of her PCP wishes as to what if any antidepressant she should be taking.   . Anxiety   . Headache   . Neuromuscular disorder     spinal stenosis- lumbar    Past Surgical History  Procedure Laterality Date  . Orif left clavicle fracture and pinning 2nd metacarpal  01/03/2010  . Lumbar  laminectomy/decompression microdiscectomy N/A 02/09/2015    Procedure: Right Lumbar three- four, Lumbar four- five Laminectomy;  Surgeon: Hilda Lias, MD;  Location: [facility];  Service: Neurosurgery;  Laterality: N/A;  L3-4 L4-5 Laminectomy   Family History  Problem Relation Age of Onset  . ADD / ADHD Neg Hx   . Anxiety disorder Neg Hx   . Alcohol abuse Neg Hx   . Bipolar disorder Neg Hx   . Dementia Neg Hx   . Depression Neg Hx   . Drug abuse Neg Hx   . OCD Neg Hx   . Schizophrenia Neg Hx    Social History  Substance Use Topics  . Smoking status: Current Every Day Smoker -- 1.50 packs/day for 44 years    Types: Cigarettes  . Smokeless tobacco: Never Used  . Alcohol Use: 0.6 oz/week    1 Cans of beer, 0 Shots of liquor per week     Comment: was drinking beer daily now down to total 1.5 cans a week, quit hard liquor 1 month ago, as of  01/2015- pt. reports that its less than it use to be but states " I am not an alcoholic"   OB History    No data available     Review of Systems 10/14 systems reviewed and are negative other than those stated in the HPI  Allergies  Celexa  Home Medications   Prior to Admission medications   Medication Sig  Start Date End Date Taking? Authorizing Provider  ALPRAZolam Prudy Feeler) 1 MG tablet Take 1 tablet (1 mg total) by mouth 2 (two) times daily. May take an additional 0.5 mg once a day if needed Patient taking differently: Take 1 mg by mouth 2 (two) times daily. May take an additional 0.5 mg once a day if needed for anxiety 01/19/15   Pearline Cables, MD  aspirin-acetaminophen-caffeine (EXCEDRIN MIGRAINE) [phone] MG per tablet Take 2 tablets by mouth every 6 (six) hours as needed for headache or migraine.    Historical Provider, MD  HYDROcodone-acetaminophen (NORCO) 10-325 MG per tablet Take 1 tablet by mouth every 8 (eight) hours as needed. May fill on 01/24/2015 Patient taking differently: Take 1 tablet by mouth every 8 (eight) hours as  needed for moderate pain.  01/19/15   Gwenlyn Found Copland, MD  Hyprom-Naphaz-Polysorb-Zn Sulf (CLEAR EYES COMPLETE OP) Place 1 drop into both eyes daily as needed (dry eyes).     Historical Provider, MD  lisinopril (PRINIVIL,ZESTRIL) 40 MG tablet TAKE 1 TABLET BY MOUTH EVERY DAY FOR BLOOD PRESSURE CONTROL Patient taking differently: Take 40 mg by mouth daily before breakfast.  01/19/15   Gwenlyn Found Copland, MD  ondansetron (ZOFRAN) 4 MG tablet Take 1 tablet (4 mg total) by mouth every 8 (eight) hours as needed for nausea or vomiting. 02/28/15   Tishira R Brewington, PA-C  VENTOLIN HFA 108 (90 BASE) MCG/ACT inhaler INHALE 2 PUFFS INTO THE LUNGS EVERY 6 (SIX) HOURS AS NEEDED FOR WHEEZING. Patient taking differently: INHALE 3 PUFFS INTO THE LUNGS EVERY 6 (SIX) HOURS AS NEEDED FOR WHEEZING. 08/11/14   Gwenlyn Found Copland, MD   BP 164/80 mmHg  Pulse 93  Temp(Src) 98.4 F (36.9 C) (Oral)  Resp 16  Ht 5\' 2"  (1.575 m)  Wt 154 lb (69.854 kg)  BMI 28.16 kg/m2  SpO2 99% Physical Exam Physical Exam  Nursing note and vitals reviewed. Constitutional: Appears fatigued and worn out, but well developed, well nourished, non-toxic, and in no acute distress Head: Normocephalic and atraumatic.  Mouth/Throat: Oropharynx is clear.  mucous membranes appear dry  Neck: Normal range of motion. Neck supple.  Cardiovascular: Tachycardic rate and regular rhythm.   no lower extremity edema.  Pulmonary/Chest: Effort normal and breath sounds normal.  Abdominal: Soft. There is epigastric tenderness. There is no rebound and no guarding.  negative Murphy's sign. No tenderness at McBurney's point. No tenderness in the left lower quadrant or suprapubic abdomen.  Musculoskeletal: Normal range of motion.  Neurological: Alert, no facial droop, fluent speech, moves all extremities symmetrically Skin: Skin is warm and dry.  Psychiatric: Cooperative  ED Course  Procedures (including critical care time) Labs Review Labs Reviewed   COMPREHENSIVE METABOLIC PANEL - Abnormal; Notable for the following:    Sodium 114 (*)    Potassium 2.7 (*)    Chloride 75 (*)    Calcium 8.7 (*)    All other components within normal limits  CBC - Abnormal; Notable for the following:    MCHC 36.2 (*)    Platelets 132 (*)    All other components within normal limits  MAGNESIUM - Abnormal; Notable for the following:    Magnesium 1.4 (*)    All other components within normal limits  LIPASE, BLOOD  PHOSPHORUS  URINALYSIS, ROUTINE W REFLEX MICROSCOPIC (NOT AT [facility])    Imaging Review Dg Chest 2 View  03/01/2015   CLINICAL DATA:  59 year old female with epigastric pain nausea and vomiting  EXAM: CHEST  2 VIEW  COMPARISON:  Chest radiograph dated 01/05/2015 the  FINDINGS: The heart size and mediastinal contours are within normal limits. Both lungs are clear. Old healed fracture of the lateral left clavicle and left ribs.  IMPRESSION: No active cardiopulmonary disease.   Electronically Signed   By: Elgie Collard M.D.   On: 03/01/2015 18:17   I have personally reviewed and evaluated these images and lab results as part of my medical decision-making.   EKG Interpretation   Date/Time:  Tuesday March 01 2015 15:49:59 EDT Ventricular Rate:  105 PR Interval:  136 QRS Duration: 88 QT Interval:  362 QTC Calculation: 478 R Axis:   59 Text Interpretation:  Sinus tachycardia Otherwise normal ECG No  significant change since last tracing Confirmed by Lashaun Poch MD, Abdoulaye Drum [phone]) on  03/01/2015 4:25:46 PM      MDM   Final diagnoses:  Nausea vomiting and diarrhea  Hypokalemia  Hypomagnesemia  Hyponatremia  Dehydration  CRITICAL CARE Performed by: Lavera Guise   Total critical care time: 35 minutes  Critical care time was exclusive of separately billable procedures and treating other patients.  Critical care was necessary to treat or prevent imminent or life-threatening deterioration.  Critical care was time spent personally by me on  the following activities: managements of electrolyte derangements, development of treatment plan with patient and/or surrogate as well as nursing, discussions with consultants, evaluation of patient's response to treatment, examination of patient, obtaining history from patient or surrogate, ordering and performing treatments and interventions, ordering and review of laboratory studies, ordering and review of radiographic studies, pulse oximetry and re-evaluation of patient's condition.     In short, this is a 59 year old female who presents with nausea vomiting, and diarrhea. She appears low energy and fatigue on presentation, but is not toxic in no acute distress. She has mild tachycardia, with normotension and good perfusion throughout. No respiratory issues. Abdomen is soft and non-peritoneal, with focal epigastric tenderness to palpation. Overall presentation seems consistent with likely viral GI illness. No concern for acute surgical etiology of her abdominal pain, and exam not consistent with that of diverticulitis or appendicitis. Basic blood work is concerning for worsening dehydration and electrolyte derangements. She is hyponatremic with a sodium of 114, hypovolemic in nature. She also has hypomagnesemia and hypokalemia. No evidence of QTC prolongation on EKG. Given 80 mEq of oral potassium, 10 mEq of IV potassium, 1 g of magnesium sulfate, and started on normal saline at 150 mL/h. discussed with [facility], and admitted for electrolyte repletion and rehydration.  Lavera Guise, MD 03/01/15 1821    Lavera Guise, MD 03/01/15 2117

## 2015-03-01 NOTE — Telephone Encounter (Signed)
Wants to let dr copland know that she has put in a message to her on my chart

## 2015-03-01 NOTE — Telephone Encounter (Signed)
Called her upon reading this note.   Reviewed labs from yesterday- her sodium is low, and her chloride is quite low.  She continues to have belly pain.  She will go to the ER today as soon as her husband gets home from work in a couple of hours.

## 2015-03-01 NOTE — ED Notes (Addendum)
Pt still reporting that iv potassium is still burning. Patient now refusing to receive the rest of her iv potassium. Discussed with patient the importance of getting the iv potassium. Advised a larger iv site may help to decrease burning of potassium infusion, pt advised she was willing to try this. RN to attempt second iv site.

## 2015-03-01 NOTE — ED Notes (Signed)
Patient transported to X-ray 

## 2015-03-01 NOTE — ED Notes (Signed)
Pt is here with NVD since Friday, weakness, and feels faint.  Pt reports stomach burning and has migraine.  Saw primary MD yesterday and they called her back and told her to come to the ED with low sodium and electrolytes off.

## 2015-03-02 DIAGNOSIS — E876 Hypokalemia: Secondary | ICD-10-CM

## 2015-03-02 DIAGNOSIS — F17200 Nicotine dependence, unspecified, uncomplicated: Secondary | ICD-10-CM

## 2015-03-02 DIAGNOSIS — F1020 Alcohol dependence, uncomplicated: Secondary | ICD-10-CM

## 2015-03-02 DIAGNOSIS — R197 Diarrhea, unspecified: Secondary | ICD-10-CM

## 2015-03-02 DIAGNOSIS — E871 Hypo-osmolality and hyponatremia: Secondary | ICD-10-CM

## 2015-03-02 DIAGNOSIS — I10 Essential (primary) hypertension: Secondary | ICD-10-CM

## 2015-03-02 DIAGNOSIS — J449 Chronic obstructive pulmonary disease, unspecified: Secondary | ICD-10-CM

## 2015-03-02 DIAGNOSIS — R112 Nausea with vomiting, unspecified: Secondary | ICD-10-CM

## 2015-03-02 DIAGNOSIS — F411 Generalized anxiety disorder: Secondary | ICD-10-CM

## 2015-03-02 LAB — COMPREHENSIVE METABOLIC PANEL
ALBUMIN: 3.6 g/dL (ref 3.5–5.0)
ALK PHOS: 63 U/L (ref 38–126)
ALT: 25 U/L (ref 14–54)
ANION GAP: 13 (ref 5–15)
AST: 37 U/L (ref 15–41)
BUN: <5 mg/dL — ABNORMAL LOW (ref 6–20)
CALCIUM: 8.6 mg/dL — AB (ref 8.9–10.3)
CO2: 24 mmol/L (ref 22–32)
Chloride: 85 mmol/L — ABNORMAL LOW (ref 101–111)
Creatinine, Ser: 0.57 mg/dL (ref 0.44–1.00)
GFR calc non Af Amer: >60 mL/min (ref >60)
Glucose, Bld: 87 mg/dL (ref 65–99)
POTASSIUM: 3.1 mmol/L — AB (ref 3.5–5.1)
SODIUM: 122 mmol/L — AB (ref 135–145)
TOTAL PROTEIN: 6.4 g/dL — AB (ref 6.5–8.1)
Total Bilirubin: 1.4 mg/dL — ABNORMAL HIGH (ref 0.3–1.2)

## 2015-03-02 LAB — HEMOGLOBIN A1C
HEMOGLOBIN A1C: 5.3 % (ref 4.8–5.6)
MEAN PLASMA GLUCOSE: 105 mg/dL

## 2015-03-02 LAB — CBC
HCT: 40 % (ref 36.0–46.0)
HEMOGLOBIN: 14.6 g/dL (ref 12.0–15.0)
MCH: 34.4 pg — AB (ref 26.0–34.0)
MCHC: 36.5 g/dL — AB (ref 30.0–36.0)
MCV: 94.1 fL (ref 78.0–100.0)
Platelets: 126 10*3/uL — ABNORMAL LOW (ref 150–400)
RBC: 4.25 MIL/uL (ref 3.87–5.11)
RDW: 13.2 % (ref 11.5–15.5)
WBC: 6.8 10*3/uL (ref 4.0–10.5)

## 2015-03-02 LAB — C DIFFICILE QUICK SCREEN W PCR REFLEX
C DIFFICILE (CDIFF) INTERP: NEGATIVE
C DIFFICILE (CDIFF) TOXIN: NEGATIVE
C DIFFICLE (CDIFF) ANTIGEN: NEGATIVE

## 2015-03-02 LAB — MAGNESIUM: Magnesium: 2 mg/dL (ref 1.7–2.4)

## 2015-03-02 NOTE — Progress Notes (Signed)
Nutrition Brief Note  Patient identified on the Malnutrition Screening Tool (MST) Report  Wt Readings from Last 15 Encounters:  03/01/15 156 lb (70.761 kg)  02/28/15 156 lb (70.761 kg)  02/10/15 167 lb 1.6 oz (75.796 kg)  02/07/15 165 lb 1.6 oz (74.889 kg)  01/19/15 164 lb 9.6 oz (74.662 kg)  01/05/15 160 lb (72.576 kg)  11/24/14 161 lb (73.029 kg)  09/10/14 161 lb (73.029 kg)  07/19/14 162 lb (73.483 kg)  06/07/14 158 lb (71.668 kg)  05/20/14 155 lb 9.6 oz (70.58 kg)  05/14/14 155 lb 3.2 oz (70.398 kg)  05/06/14 156 lb 8.4 oz (71 kg)  05/05/14 152 lb 9.6 oz (69.219 kg)  04/15/14 158 lb 12.8 oz (72.031 kg)   Brenda Rice is a 59 y.o. female, with past medical history of tobacco abuse, alcohol abuse, recent lumbar spine surgery, presents with complaints of nausea, vomiting and diarrhea, patient reports Friday she started to develop mild abdominal pain, nausea and vomiting and diarrhea, reports pain in color liquid 4-5 times per day, reports vomiting more as a dry heaving, denies any coffee-ground emesis, didn't reports mild epigastric pain, denies any coffee-ground emesis, bright red blood per rectum, or melena, so her PCP before 2 days, was mildly hyponatremic at 124, some Zofran without much help, presents to ED today with no improvement of her symptoms, workup was significant for hyponatremia of 114, hypokalemia of 2.7, hypochloremia with 75, and hypomagnesemia of 1.4, agent is afebrile with no leukocytosis, hospitalist called to admit .  Pt admitted with weakness, emesis, and diarrhea.   Pt in bathroom at time of visit. Chart review reveals UBW ranges between 150-160#. She consumed 75% of her breakfast this AM.   Body mass index is 28.53 kg/(m^2). Patient meets criteria for overweight based on current BMI.   Current diet order is regular, patient is consuming approximately 75% of meals at this time. Labs and medications reviewed.   No nutrition interventions warranted at this time.  If nutrition issues arise, please consult RD.   Wiatt Mahabir A. Mayford Knife, RD, LDN, CDE Pager: [phone] After hours Pager: [phone]

## 2015-03-02 NOTE — Progress Notes (Signed)
[facility]                                                                              Patient Demographics  Brenda Rice, is a 59 y.o. female, DOB - 03-15-1956, ZOX:[id number]  Admit date - 03/01/2015   Admitting Physician Starleen Arms, MD  Outpatient Primary MD for the patient is Abbe Amsterdam, MD  LOS - 1   Chief Complaint  Patient presents with  . Weakness  . Emesis  . Diarrhea      HPI on 03/01/2015 by Dr. Mliss Fritz Elgergawy Ardell Rice is a 59 y.o. female, with past medical history of tobacco abuse, alcohol abuse, recent lumbar spine surgery, presents with complaints of nausea, vomiting and diarrhea, patient reports Friday she started to develop mild abdominal pain, nausea and vomiting and diarrhea, reports pain in color liquid 4-5 times per day, reports vomiting more as a dry heaving, denies any coffee-ground emesis, didn't reports mild epigastric pain, denies any coffee-ground emesis, bright red blood per rectum, or melena, so her PCP before 2 days, was mildly hyponatremic at 124, some Zofran without much help, presents to ED today with no improvement of her symptoms, workup was significant for hyponatremia of 114, hypokalemia of 2.7, hypochloremia with 75, and hypomagnesemia of 1.4, agent is afebrile with no leukocytosis, hospitalist called to admit .  Assessment & Plan   Nausea, vomiting, diarrhea -Likely secondary to viral gastroenteritis (per patient, husband also had similar symptoms) -Patient currently afebrile with no leukocytosis -C. difficile and GI pathogen panel pending -No antibiotics  will be given at this time  Hypokalemia, hypomagnesemia -Likely secondary to GI losses -Continue to monitor and replace as needed -Potassium 3.1  Hyponatremia hypochloremia -Likely secondary to volume depletion from gastroenteritis versus alcoholism -Continue IVF, continued monitor BMP -Sodium improving, 122, chloride also improving 85  Dehydration -Continue  IVF  Hypertension -Lisinopril held, continue hydralazine as needed  Tobacco abuse -Continue nicotine patch  Alcohol abuse -Continue CIWA protocol -Patient appears to be stable, no DTs  COPD -Compensated, no active wheezing  Generalized anxiety disorder -Continue Xanax when necessary along with CIWA  Code Status: Full  Family Communication: none at bedside  Disposition Plan: Admitted.  Continue to monitor Na  Time Spent in minutes   30 minutes  Procedures  None  Consults   none  DVT Prophylaxis  heparin  Lab Results  Component Value Date   PLT 126* 03/02/2015    Medications  Scheduled Meds: . sodium chloride   Intravenous STAT  . ALPRAZolam  1 mg Oral BID  . feeding supplement (ENSURE ENLIVE)  237 mL Oral BID BM  . folic acid  1 mg Oral Daily  . heparin  5,000 Units Subcutaneous 3 times per day  . LORazepam  0-4 mg Intravenous Q6H   Followed by  . [START ON 03/03/2015] LORazepam  0-4 mg Intravenous Q12H  . multivitamin with minerals  1 tablet Oral Daily  . nicotine  21 mg Transdermal Q24H  . potassium chloride  80 mEq Oral Once  . sodium chloride  3 mL Intravenous Q12H  . thiamine  100 mg Oral Daily   Continuous Infusions: . 0.9 % NaCl with  KCl 20 mEq / L 100 mL/hr at 03/02/15 0930   PRN Meds:.acetaminophen **OR** acetaminophen, albuterol, bisacodyl, hydrALAZINE, HYDROcodone-acetaminophen, LORazepam **OR** LORazepam, ondansetron **OR** ondansetron (ZOFRAN) IV, polyethylene glycol  Antibiotics    Anti-infectives    None     Subjective:   Keayra Esterly seen and examined today. Patient is chest pain, shortness of breath. Feels her diarrhea and her vomiting have improved. Denies abdominal pain at this time.  Objective:   Filed Vitals:   03/02/15 0109 03/02/15 0111 03/02/15 0642 03/02/15 1150  BP: 186/82 179/72 138/71 137/68  Pulse: 106 102 96 102  Temp: 97.8 F (36.6 C)  98.4 F (36.9 C) 98.4 F (36.9 C)  TempSrc: Oral  Oral Oral  Resp: 20  20  18   Height:      Weight:      SpO2: 96%  98% 97%    Wt Readings from Last 3 Encounters:  03/01/15 70.761 kg (156 lb)  02/28/15 70.761 kg (156 lb)  02/10/15 75.796 kg (167 lb 1.6 oz)     Intake/Output Summary (Last 24 hours) at 03/02/15 1346 Last data filed at 03/02/15 1205  Gross per 24 hour  Intake 1823.66 ml  Output   2440 ml  Net -616.34 ml    Exam  General: Well developed, well nourished, NAD, appears stated age  HEENT: NCAT, mucous membranes moist.   Cardiovascular: S1 S2 auscultated, no rubs, murmurs or gallops. Regular rate and rhythm.  Respiratory: Clear to auscultation bilaterally with equal chest rise  Abdomen: Soft, nontender, nondistended, + bowel sounds  Extremities: warm dry without cyanosis clubbing or edema  Neuro: AAOx3, nonfocal  Data Review   Micro Results No results found for this or any previous visit (from the past 240 hour(s)).  Radiology Reports Dg Chest 2 View  03/01/2015   CLINICAL DATA:  59 year old female with epigastric pain nausea and vomiting  EXAM: CHEST  2 VIEW  COMPARISON:  Chest radiograph dated 01/05/2015 the  FINDINGS: The heart size and mediastinal contours are within normal limits. Both lungs are clear. Old healed fracture of the lateral left clavicle and left ribs.  IMPRESSION: No active cardiopulmonary disease.   Electronically Signed   By: Elgie Collard M.D.   On: 03/01/2015 18:17   Dg Lumbar Spine 2-3 Views  02/09/2015   CLINICAL DATA:  For L3-4 L4-5 laminectomy  EXAM: LUMBAR SPINE - 2-3 VIEW  COMPARISON:  Lumbar spine films of 11/04/2014 and 12/10/2014, as well as MRI of the lumbar spine of 09/10/2014.  FINDINGS: A portable lateral cross-table view from the operating room labeled 1 shows an instrument beneath the spinous process of L2 directed toward the superior body of L3 posteriorly. CA cross-table lateral portable view labeled 2 shows 3 instruments posteriorly. The more cephalad is directed toward the posterior inferior  body of L3, the middle instrument is directed toward the posterior superior body of L4, with the more caudal instrument directed toward the L4-5 interspace.  IMPRESSION: Instruments positioned for localization as described above.   Electronically Signed   By: Dwyane Dee M.D.   On: 02/09/2015 12:14   Dg Abd 2 Views  03/01/2015   CLINICAL DATA:  Abdominal pain 2 days  EXAM: ABDOMEN - 2 VIEW  COMPARISON:  None.  FINDINGS: The bowel gas pattern is normal. There is no evidence of free air. No radio-opaque calculi or other significant radiographic abnormality is seen. Mild left and moderate right hip arthritis. Mild levoscoliosis lumbar spine.  IMPRESSION: No acute findings  Electronically Signed   By: Esperanza Heir M.D.   On: 03/01/2015 22:30    CBC  Recent Labs Lab 02/28/15 1202 03/01/15 1610 03/02/15 0609  WBC 10.2 9.4 6.8  HGB 14.1 14.1 14.6  HCT 45.5 38.9 40.0  PLT  --  132* 126*  MCV 98.9* 92.8 94.1  MCH 30.7 33.7 34.4*  MCHC 31.0* 36.2* 36.5*  RDW  --  12.9 13.2    Chemistries   Recent Labs Lab 02/28/15 1200 03/01/15 1610 03/01/15 1711 03/02/15 0609  NA 124* 114*  --  122*  K 3.5 2.7*  --  3.1*  CL 77* 75*  --  85*  CO2 30 24  --  24  GLUCOSE 89 89  --  87  BUN 12 6  --  <5*  CREATININE 0.68 0.63  --  0.57  CALCIUM 9.5 8.7*  --  8.6*  MG  --   --  1.4* 2.0  AST 29 33  --  37  ALT 22 25  --  25  ALKPHOS 69 70  --  63  BILITOT 1.0 1.2  --  1.4*   ------------------------------------------------------------------------------------------------------------------ estimated creatinine clearance is 68.1 mL/min (by C-G formula based on Cr of 0.57). ------------------------------------------------------------------------------------------------------------------  Recent Labs  03/01/15 1610  HGBA1C 5.3   ------------------------------------------------------------------------------------------------------------------ No results for input(s): CHOL, HDL, LDLCALC, TRIG,  CHOLHDL, LDLDIRECT in the last 72 hours. ------------------------------------------------------------------------------------------------------------------ No results for input(s): TSH, T4TOTAL, T3FREE, THYROIDAB in the last 72 hours.  Invalid input(s): FREET3 ------------------------------------------------------------------------------------------------------------------ No results for input(s): VITAMINB12, FOLATE, FERRITIN, TIBC, IRON, RETICCTPCT in the last 72 hours.  Coagulation profile No results for input(s): INR, PROTIME in the last 168 hours.  No results for input(s): DDIMER in the last 72 hours.  Cardiac Enzymes No results for input(s): CKMB, TROPONINI, MYOGLOBIN in the last 168 hours.  Invalid input(s): CK ------------------------------------------------------------------------------------------------------------------ Invalid input(s): POCBNP    Shonta Bourque D.O. on 03/02/2015 at 1:46 PM  Between 7am to 7pm - Pager - [phone]  After 7pm go to [email or web address] - password [email or web address]  And look for the night coverage person covering for me after hours  [facility] Office  [phone]

## 2015-03-02 NOTE — Care Management Note (Addendum)
Case Management Note  Patient DetaBESSIE BOYTE Hazel L Broeker MRN: [id number] Date of Birth: [birth year]  Subjective/Objective:  Patient is from home, per pt eval ,. Patient had recent back surgery, she was scheduled to go to outpatient physical therapy on [address] but ended up here.  Patient will need to resume her outpatient pt. NA is still low, will cont to monitor.  NCM will cont to follow for dc needs.                  Action/Plan:   Expected Discharge Date:                  Expected Discharge Plan:  Home w Home Health Services  In-House Referral:     Discharge planning Services  CM Consult  Post Acute Care Choice:    Choice offered to:     DME Arranged:    DME Agency:     HH Arranged:    HH Agency:     Status of Service:  In process, will continue to follow  Medicare Important Message Given:    Date Medicare IM Given:    Medicare IM give by:    Date Additional Medicare IM Given:    Additional Medicare Important Message give by:     If discussed at Long Length of Stay Meetings, dates discussed:    Additional Comments:  Leone Haven, RN 03/02/2015, 5:58 PM

## 2015-03-02 NOTE — Evaluation (Signed)
Physical Therapy Evaluation and Discharge Patient Details Name: Brenda Rice MRN: [id number] DOB: [birth year] Today's Date: 03/02/2015   History of Present Illness  Brenda Rice is a 59 y.o. female, with past medical history of tobacco abuse, alcohol abuse, recent lumbar spine surgery, presents with complaints of nausea, vomiting and diarrhea. Found to have have hypokalemia and hyponatremia.  Clinical Impression  Patient evaluated by Physical Therapy with no further acute PT needs identified. All education has been completed and the patient has no further questions. Minimal balance deficits noted with higher level dynamic gait challenges. Was scheduled to receive PT for recent back surgery prior to this admission. Will benefit from continued PT for back as recommended by Dr. Jeral Fruit. See below for any follow-up Physial Therapy or equipment needs. PT is signing off. Thank you for this referral.     Follow Up Recommendations Supervision for mobility/OOB;No PT follow up (Resume therapy as per Dr. Jeral Fruit for back surgery)    Equipment Recommendations  None recommended by PT    Recommendations for Other Services       Precautions / Restrictions Precautions Precautions: Back Precaution Comments: Recalls back precautions Restrictions Weight Bearing Restrictions: No      Mobility  Bed Mobility Overal bed mobility: Modified Independent             General bed mobility comments: Performs log roll correctly  Transfers Overall transfer level: Needs assistance Equipment used: None Transfers: Sit to/from Stand Sit to Stand: Supervision         General transfer comment: Supervision for safety. Minimal sway noted. Able to self correct.  Ambulation/Gait Ambulation/Gait assistance: Supervision Ambulation Distance (Feet): 275 Feet Assistive device: None Gait Pattern/deviations: Step-through pattern;Drifts right/left Gait velocity: decreased Gait velocity interpretation: Below normal  speed for age/gender General Gait Details: Slightly guarded. Denies holding assistive device. Tolerated dynamic gait challenges with minimal deviation from straight path and no physical assist required to correct. VC for awareness of balance and safety with mobility tasks  Stairs            Wheelchair Mobility    Modified Rankin (Stroke Patients Only)       Balance Overall balance assessment: Needs assistance Sitting-balance support: No upper extremity supported;Feet supported Sitting balance-Leahy Scale: Normal     Standing balance support: No upper extremity supported Standing balance-Leahy Scale: Good                               Pertinent Vitals/Pain Pain Assessment: 0-10 Pain Score: 5  Pain Location: back Pain Descriptors / Indicators: Aching Pain Intervention(s): Monitored during session;Repositioned    Home Living Family/patient expects to be discharged to:: Private residence Living Arrangements: Spouse/significant other Available Help at Discharge: Family;Available 24 hours/day Type of Home: House Home Access: Stairs to enter Entrance Stairs-Rails: Right Entrance Stairs-Number of Steps: 6 Home Layout: One level Home Equipment: Cane - single point Additional Comments: drives    Prior Function Level of Independence: Needs assistance   Gait / Transfers Assistance Needed: ind  ADL's / Homemaking Assistance Needed: husband helping with bath and dress - "just a little" immediately following surgery.        Hand Dominance   Dominant Hand: Left    Extremity/Trunk Assessment   Upper Extremity Assessment: Defer to OT evaluation           Lower Extremity Assessment: Generalized weakness         Communication  Communication: No difficulties  Cognition Arousal/Alertness: Awake/alert Behavior During Therapy: WFL for tasks assessed/performed Overall Cognitive Status: Within Functional Limits for tasks assessed                       General Comments General comments (skin integrity, edema, etc.): States she was supposed to have HHPT day she was admitted but has not been seen.    Exercises General Exercises - Lower Extremity Ankle Circles/Pumps: AROM;Both;10 reps;Seated Gluteal Sets: Strengthening;Both;10 reps;Seated Long Arc Quad: Strengthening;Both;10 reps;Seated      Assessment/Plan    PT Assessment Patent does not need any further PT services  PT Diagnosis Abnormality of gait;Generalized weakness   PT Problem List    PT Treatment Interventions     PT Goals (Current goals can be found in the Care Plan section) Acute Rehab PT Goals Patient Stated Goal: Go home PT Goal Formulation: All assessment and education complete, DC therapy    Frequency     Barriers to discharge        Co-evaluation               End of Session   Activity Tolerance: Patient tolerated treatment well Patient left: in chair;with call bell/phone within reach Nurse Communication: Mobility status         Time: 1610-9604 PT Time Calculation (min) (ACUTE ONLY): 18 min   Charges:   PT Evaluation $Initial PT Evaluation Tier I: 1 Procedure     PT G CodesBerton Mount 03/02/2015, 1:41 PM Sunday [place], [place] [phone]

## 2015-03-03 DIAGNOSIS — E860 Dehydration: Secondary | ICD-10-CM

## 2015-03-03 LAB — CBC
HCT: 35.1 % — ABNORMAL LOW (ref 36.0–46.0)
HEMOGLOBIN: 12 g/dL (ref 12.0–15.0)
MCH: 33.1 pg (ref 26.0–34.0)
MCHC: 34.2 g/dL (ref 30.0–36.0)
MCV: 97 fL (ref 78.0–100.0)
Platelets: 125 10*3/uL — ABNORMAL LOW (ref 150–400)
RBC: 3.62 MIL/uL — ABNORMAL LOW (ref 3.87–5.11)
RDW: 13.5 % (ref 11.5–15.5)
WBC: 5.8 10*3/uL (ref 4.0–10.5)

## 2015-03-03 LAB — BASIC METABOLIC PANEL
Anion gap: 9 (ref 5–15)
BUN: 6 mg/dL (ref 6–20)
CHLORIDE: 95 mmol/L — AB (ref 101–111)
CO2: 25 mmol/L (ref 22–32)
CREATININE: 0.54 mg/dL (ref 0.44–1.00)
Calcium: 8.5 mg/dL — ABNORMAL LOW (ref 8.9–10.3)
GFR calc Af Amer: >60 mL/min (ref >60)
GFR calc non Af Amer: >60 mL/min (ref >60)
GLUCOSE: 100 mg/dL — AB (ref 65–99)
Potassium: 3.1 mmol/L — ABNORMAL LOW (ref 3.5–5.1)
SODIUM: 129 mmol/L — AB (ref 135–145)

## 2015-03-03 MED ORDER — HYDROCODONE-ACETAMINOPHEN 5-325 MG PO TABS: 1.0000 | ORAL_TABLET | Freq: Four times a day (QID) | ORAL | Status: DC | PRN

## 2015-03-03 MED ORDER — NICOTINE 21 MG/24HR TD PT24: 21.0000 mg | MEDICATED_PATCH | TRANSDERMAL | Status: DC

## 2015-03-03 MED ORDER — POTASSIUM CHLORIDE CRYS ER 20 MEQ PO TBCR
40.0000 meq | EXTENDED_RELEASE_TABLET | Freq: Once | ORAL | Status: AC
  Administered 2015-03-03: 40 meq via ORAL
  Filled 2015-03-03: qty 2

## 2015-03-03 MED ORDER — ADULT MULTIVITAMIN W/MINERALS CH: 1.0000 | ORAL_TABLET | Freq: Every day | ORAL | Status: DC

## 2015-03-03 NOTE — Progress Notes (Signed)
Nsg Discharge Note  Admit Date:  03/01/2015 Discharge date: 03/03/2015   Brenda Rice to be D/C'd Home per MD order.  AVS completed.  Copy for chart, and copy for patient signed, and dated. Patient/caregiver able to verbalize understanding.  Discharge Medication:   Medication List    STOP taking these medications        HYDROcodone-acetaminophen 10-325 MG per tablet  Commonly known as:  NORCO  Replaced by:  HYDROcodone-acetaminophen 5-325 MG per tablet      TAKE these medications        ALPRAZolam 1 MG tablet  Commonly known as:  XANAX  Take 1 tablet (1 mg total) by mouth 2 (two) times daily. May take an additional 0.5 mg once a day if needed     aspirin-acetaminophen-caffeine 250-250-65 MG per tablet  Commonly known as:  EXCEDRIN MIGRAINE  Take 2 tablets by mouth every 6 (six) hours as needed for headache or migraine.     CLEAR EYES COMPLETE OP  Place 1 drop into both eyes daily as needed (dry eyes).     HYDROcodone-acetaminophen 5-325 MG per tablet  Commonly known as:  NORCO/VICODIN  Take 1 tablet by mouth every 6 (six) hours as needed for severe pain.     lisinopril 40 MG tablet  Commonly known as:  PRINIVIL,ZESTRIL  TAKE 1 TABLET BY MOUTH EVERY DAY FOR BLOOD PRESSURE CONTROL     multivitamin with minerals Tabs tablet  Take 1 tablet by mouth daily.     nicotine 21 mg/24hr patch  Commonly known as:  NICODERM CQ - dosed in mg/24 hours  Place 1 patch (21 mg total) onto the skin daily.     ondansetron 4 MG tablet  Commonly known as:  ZOFRAN  Take 1 tablet (4 mg total) by mouth every 8 (eight) hours as needed for nausea or vomiting.     VENTOLIN HFA 108 (90 BASE) MCG/ACT inhaler  Generic drug:  albuterol  INHALE 2 PUFFS INTO THE LUNGS EVERY 6 (SIX) HOURS AS NEEDED FOR WHEEZING.        Discharge Assessment: Filed Vitals:   03/02/15 2243  BP: 151/79  Pulse: 83  Temp: 98.2 F (36.8 C)  Resp: 18   Skin clean, dry and intact without evidence of skin break down,  no evidence of skin tears noted. IV catheter discontinued intact. Site without signs and symptoms of complications - no redness or edema noted at insertion site, patient denies c/o pain - only slight tenderness at site.  Dressing with slight pressure applied.  D/c Instructions-Education: Discharge instructions given to patient/family with verbalized understanding. D/c education completed with patient/family including follow up instructions, medication list, d/c activities limitations if indicated, with other d/c instructions as indicated by MD - patient able to verbalize understanding, all questions fully answered. Patient instructed to return to ED, call 911, or call MD for any changes in condition.  Patient escorted via WC, and D/C home via private auto.  Kern Reap, RN 03/03/2015 11:57 AM

## 2015-03-03 NOTE — Care Management Note (Signed)
Case Management Note  Patient Details  Name: SHERLIE BOYUM MRN: [id number] Date of Birth: 06/24/1956  Subjective/Objective:   Patient is for dc today, no needs, she will continue with outpatient pt that was previously schedule through Dr. Jeral Fruit.                 Action/Plan:   Expected Discharge Date:                  Expected Discharge Plan:  Home/Self Care  In-House Referral:     Discharge planning Services  CM Consult  Post Acute Care Choice:    Choice offered to:     DME Arranged:    DME Agency:     HH Arranged:    HH Agency:     Status of Service:  Completed, signed off  Medicare Important Message Given:    Date Medicare IM Given:    Medicare IM give by:    Date Additional Medicare IM Given:    Additional Medicare Important Message give by:     If discussed at Long Length of Stay Meetings, dates discussed:    Additional Comments:  Leone Haven, RN 03/03/2015, 10:57 AM

## 2015-03-03 NOTE — Clinical Documentation Improvement (Signed)
Internal Medicine  Abnormal Lab/Test Results:   Component Platelets  Latest Ref Rng 150 - 400 K/uL  03/01/2015 132 (L)  03/02/2015 126 (L)   Possible Clinical Conditions associated with below indicators  Thrombocytopenia  Other Condition  Cannot Clinically Determine   Please exercise your independent, professional judgment when responding. A specific answer is not anticipated or expected.   Thank you, Doy Mince, RN [phone] Clinical Documentation Specialist

## 2015-03-03 NOTE — Discharge Instructions (Signed)
Dehydration, Adult Dehydration is when you lose more fluids from the body than you take in. Vital organs like the kidneys, brain, and heart cannot function without a proper amount of fluids and salt. Any loss of fluids from the body can cause dehydration.  CAUSES   Vomiting.  Diarrhea.  Excessive sweating.  Excessive urine output.  Fever. SYMPTOMS  Mild dehydration  Thirst.  Dry lips.  Slightly dry mouth. Moderate dehydration  Very dry mouth.  Sunken eyes.  Skin does not bounce back quickly when lightly pinched and released.  Dark urine and decreased urine production.  Decreased tear production.  Headache. Severe dehydration  Very dry mouth.  Extreme thirst.  Rapid, weak pulse (more than 100 beats per minute at rest).  Cold hands and feet.  Not able to sweat in spite of heat and temperature.  Rapid breathing.  Blue lips.  Confusion and lethargy.  Difficulty being awakened.  Minimal urine production.  No tears. DIAGNOSIS  Your caregiver will diagnose dehydration based on your symptoms and your exam. Blood and urine tests will help confirm the diagnosis. The diagnostic evaluation should also identify the cause of dehydration. TREATMENT  Treatment of mild or moderate dehydration can often be done at home by increasing the amount of fluids that you drink. It is best to drink small amounts of fluid more often. Drinking too much at one time can make vomiting worse. Refer to the home care instructions below. Severe dehydration needs to be treated at the hospital where you will probably be given intravenous (IV) fluids that contain water and electrolytes. HOME CARE INSTRUCTIONS   Ask your caregiver about specific rehydration instructions.  Drink enough fluids to keep your urine clear or pale yellow.  Drink small amounts frequently if you have nausea and vomiting.  Eat as you normally do.  Avoid:  Foods or drinks high in sugar.  Carbonated  drinks.  Juice.  Extremely hot or cold fluids.  Drinks with caffeine.  Fatty, greasy foods.  Alcohol.  Tobacco.  Overeating.  Gelatin desserts.  Wash your hands well to avoid spreading bacteria and viruses.  Only take over-the-counter or prescription medicines for pain, discomfort, or fever as directed by your caregiver.  Ask your caregiver if you should continue all prescribed and over-the-counter medicines.  Keep all follow-up appointments with your caregiver. SEEK MEDICAL CARE IF:  You have abdominal pain and it increases or stays in one area (localizes).  You have a rash, stiff neck, or severe headache.  You are irritable, sleepy, or difficult to awaken.  You are weak, dizzy, or extremely thirsty. SEEK IMMEDIATE MEDICAL CARE IF:   You are unable to keep fluids down or you get worse despite treatment.  You have frequent episodes of vomiting or diarrhea.  You have blood or green matter (bile) in your vomit.  You have blood in your stool or your stool looks black and tarry.  You have not urinated in 6 to 8 hours, or you have only urinated a small amount of very dark urine.  You have a fever.  You faint. MAKE SURE YOU:   Understand these instructions.  Will watch your condition.  Will get help right away if you are not doing well or get worse. Document Released: 06/11/2005 Document Revised: 09/03/2011 Document Reviewed: 01/29/2011 ExitCare Patient Information 2015 [employer]. This information is not intended to replace advice given to you by your health care provider. Make sure you discuss any questions you have with your health care   provider.  

## 2015-03-03 NOTE — Discharge Summary (Signed)
Physician Discharge Summary  Brenda Rice JWJ:[id number] DOB: December 30, 1955 DOA: 03/01/2015  PCP: Abbe Amsterdam, MD  Admit date: 03/01/2015 Discharge date: 03/03/2015  Time spent: 45 minutes  Recommendations for Outpatient Follow-up:  Patient will be discharged to home.  Patient will need to follow up with primary care provider within one week of discharge and have repeat CBC and BMP.  Patient should continue medications as prescribed.  Patient should follow a regular diet.    Discharge Diagnoses:  Active Problems:   Hypertension   Nicotine addiction   Hyponatremia   Alcohol dependence syndrome   COPD (chronic obstructive pulmonary disease)   GAD (generalized anxiety disorder)   Hypokalemia   Hypomagnesemia   Nausea vomiting and diarrhea   Discharge Condition: Stable  Diet recommendation: Regular  Filed Weights   03/01/15 1543 03/01/15 2030  Weight: 69.854 kg (154 lb) 70.761 kg (156 lb)    History of present illness:  on 03/01/2015 by Dr. Mliss Fritz Elgergawy Brenda Rice is a 59 y.o. female, with past medical history of tobacco abuse, alcohol abuse, recent lumbar spine surgery, presents with complaints of nausea, vomiting and diarrhea, patient reports Friday she started to develop mild abdominal pain, nausea and vomiting and diarrhea, reports pain in color liquid 4-5 times per day, reports vomiting more as a dry heaving, denies any coffee-ground emesis, didn't reports mild epigastric pain, denies any coffee-ground emesis, bright red blood per rectum, or melena, so her PCP before 2 days, was mildly hyponatremic at 124, some Zofran without much help, presents to ED today with no improvement of her symptoms, workup was significant for hyponatremia of 114, hypokalemia of 2.7, hypochloremia with 75, and hypomagnesemia of 1.4, agent is afebrile with no leukocytosis, hospitalist called to admit .  Hospital Course:  Nausea, vomiting, diarrhea -Resolved, Likely secondary to viral gastroenteritis  (per patient, husband also had similar symptoms) -Patient currently afebrile with no leukocytosis -C. Difficile negative -GI pathogen panel pending, but may be followed by her PCP -No antibiotics will be given at this time  Hypokalemia, hypomagnesemia -Likely secondary to GI losses -Continue to monitor and replace as needed -repeat BMP in one week -Magnesium replaced  Chronic Hyponatremia hypochloremia -Likely secondary to volume depletion from gastroenteritis versus alcoholism -Was placed on IVF -Sodium improving, 129, chloride also improving 95 -Repeat BMP in one week  Dehydration -Continue IVF  Hypertension -Lisinopril held- continue upon discharge  Tobacco abuse -Continue nicotine patch  Alcohol abuse -Was placed on  CIWA protocol -Patient appears to be stable, no DTs  COPD -Compensated, no active wheezing  Generalized anxiety disorder -Continue Xanax PRN  Recent back surgery -Follow up with D. [name] -Given limited amount of pain medication  Chronic thrombocytopenia -After review of records, platelets have been low as 120s-130s in 2015 -Follow up with PCP and have repeat CBC  Procedures  None  Consults  none  Discharge Exam: Filed Vitals:   03/02/15 2243  BP: 151/79  Pulse: 83  Temp: 98.2 F (36.8 C)  Resp: 18   Exam  General: Well developed, well nourished, NAD  HEENT: NCAT, mucous membranes moist.   Cardiovascular: S1 S2 auscultated, RRR, no murmurs  Respiratory: Clear to auscultation  Abdomen: Soft, nontender, nondistended, + bowel sounds  Extremities: warm dry without cyanosis clubbing or edema  Neuro: AAOx3, nonfocal  Discharge Instructions      Discharge Instructions    Discharge instructions    Complete by:  As directed   Patient will be discharged to home.  Patient will  need to follow up with primary care provider within one week of discharge and have repeat CBC and BMP.  Patient should continue medications as  prescribed.  Patient should follow a regular diet.            Medication List    STOP taking these medications        HYDROcodone-acetaminophen 10-325 MG per tablet  Commonly known as:  NORCO  Replaced by:  HYDROcodone-acetaminophen 5-325 MG per tablet      TAKE these medications        ALPRAZolam 1 MG tablet  Commonly known as:  XANAX  Take 1 tablet (1 mg total) by mouth 2 (two) times daily. May take an additional 0.5 mg once a day if needed     aspirin-acetaminophen-caffeine 250-250-65 MG per tablet  Commonly known as:  EXCEDRIN MIGRAINE  Take 2 tablets by mouth every 6 (six) hours as needed for headache or migraine.     CLEAR EYES COMPLETE OP  Place 1 drop into both eyes daily as needed (dry eyes).     HYDROcodone-acetaminophen 5-325 MG per tablet  Commonly known as:  NORCO/VICODIN  Take 1 tablet by mouth every 6 (six) hours as needed for severe pain.     lisinopril 40 MG tablet  Commonly known as:  PRINIVIL,ZESTRIL  TAKE 1 TABLET BY MOUTH EVERY DAY FOR BLOOD PRESSURE CONTROL     multivitamin with minerals Tabs tablet  Take 1 tablet by mouth daily.     nicotine 21 mg/24hr patch  Commonly known as:  NICODERM CQ - dosed in mg/24 hours  Place 1 patch (21 mg total) onto the skin daily.     ondansetron 4 MG tablet  Commonly known as:  ZOFRAN  Take 1 tablet (4 mg total) by mouth every 8 (eight) hours as needed for nausea or vomiting.     VENTOLIN HFA 108 (90 BASE) MCG/ACT inhaler  Generic drug:  albuterol  INHALE 2 PUFFS INTO THE LUNGS EVERY 6 (SIX) HOURS AS NEEDED FOR WHEEZING.       Allergies  Allergen Reactions  . Celexa [Citalopram Hydrobromide] Other (See Comments)    Hyponatremia thought due to SSRI   Follow-up Information    Follow up with COPLAND,JESSICA, MD. Schedule an appointment as soon as possible for a visit in 1 week.   Specialty:  Family Medicine   Why:  Repeat CBC and BMP   Contact information:   [address] [place]  [address] [phone]        The results of significant diagnostics from this hospitalization (including imaging, microbiology, ancillary and laboratory) are listed below for reference.    Significant Diagnostic Studies: Dg Chest 2 View  03/01/2015   CLINICAL DATA:  59 year old female with epigastric pain nausea and vomiting  EXAM: CHEST  2 VIEW  COMPARISON:  Chest radiograph dated 01/05/2015 the  FINDINGS: The heart size and mediastinal contours are within normal limits. Both lungs are clear. Old healed fracture of the lateral left clavicle and left ribs.  IMPRESSION: No active cardiopulmonary disease.   Electronically Signed   By: Elgie Collard M.D.   On: 03/01/2015 18:17   Dg Lumbar Spine 2-3 Views  02/09/2015   CLINICAL DATA:  For L3-4 L4-5 laminectomy  EXAM: LUMBAR SPINE - 2-3 VIEW  COMPARISON:  Lumbar spine films of 11/04/2014 and 12/10/2014, as well as MRI of the lumbar spine of 09/10/2014.  FINDINGS: A portable lateral cross-table view from the operating room labeled 1  shows an instrument beneath the spinous process of L2 directed toward the superior body of L3 posteriorly. CA cross-table lateral portable view labeled 2 shows 3 instruments posteriorly. The more cephalad is directed toward the posterior inferior body of L3, the middle instrument is directed toward the posterior superior body of L4, with the more caudal instrument directed toward the L4-5 interspace.  IMPRESSION: Instruments positioned for localization as described above.   Electronically Signed   By: Dwyane Dee M.D.   On: 02/09/2015 12:14   Dg Abd 2 Views  03/01/2015   CLINICAL DATA:  Abdominal pain 2 days  EXAM: ABDOMEN - 2 VIEW  COMPARISON:  None.  FINDINGS: The bowel gas pattern is normal. There is no evidence of free air. No radio-opaque calculi or other significant radiographic abnormality is seen. Mild left and moderate right hip arthritis. Mild levoscoliosis lumbar spine.  IMPRESSION: No acute findings   Electronically  Signed   By: Esperanza Heir M.D.   On: 03/01/2015 22:30    Microbiology: Recent Results (from the past 240 hour(s))  C difficile quick scan w PCR reflex     Status: None   Collection Time: 03/02/15  3:46 PM  Result Value Ref Range Status   C Diff antigen NEGATIVE NEGATIVE Final   C Diff toxin NEGATIVE NEGATIVE Final   C Diff interpretation Negative for toxigenic C. difficile  Final     Labs: Basic Metabolic Panel:  Recent Labs Lab 02/28/15 1200 03/01/15 1610 03/01/15 1711 03/02/15 0609 03/03/15 0744  NA 124* 114*  --  122* 129*  K 3.5 2.7*  --  3.1* 3.1*  CL 77* 75*  --  85* 95*  CO2 30 24  --  24 25  GLUCOSE 89 89  --  87 100*  BUN 12 6  --  <5* 6  CREATININE 0.68 0.63  --  0.57 0.54  CALCIUM 9.5 8.7*  --  8.6* 8.5*  MG  --   --  1.4* 2.0  --   PHOS  --   --  2.5  --   --    Liver Function Tests:  Recent Labs Lab 02/28/15 1200 03/01/15 1610 03/02/15 0609  AST 29 33 37  ALT 22 25 25   ALKPHOS 69 70 63  BILITOT 1.0 1.2 1.4*  PROT 7.4 6.7 6.4*  ALBUMIN 4.3 3.7 3.6    Recent Labs Lab 03/01/15 1610  LIPASE 24   No results for input(s): AMMONIA in the last 168 hours. CBC:  Recent Labs Lab 02/28/15 1202 03/01/15 1610 03/02/15 0609 03/03/15 0744  WBC 10.2 9.4 6.8 5.8  HGB 14.1 14.1 14.6 12.0  HCT 45.5 38.9 40.0 35.1*  MCV 98.9* 92.8 94.1 97.0  PLT  --  132* 126* 125*   Cardiac Enzymes: No results for input(s): CKTOTAL, CKMB, CKMBINDEX, TROPONINI in the last 168 hours. BNP: BNP (last 3 results) No results for input(s): BNP in the last 8760 hours.  ProBNP (last 3 results) No results for input(s): PROBNP in the last 8760 hours.  CBG: No results for input(s): GLUCAP in the last 168 hours.     SignedEdsel Petrin  [facility] 03/03/2015, 9:14 AM

## 2015-03-04 ENCOUNTER — Ambulatory Visit (INDEPENDENT_AMBULATORY_CARE_PROVIDER_SITE_OTHER): Payer: Managed Care, Other (non HMO) | Admitting: Family Medicine

## 2015-03-04 VITALS — BP 138/80 | HR 89 | Temp 97.8°F | Resp 18 | Ht 62.0 in | Wt 157.0 lb

## 2015-03-04 DIAGNOSIS — M5441 Lumbago with sciatica, right side: Secondary | ICD-10-CM | POA: Diagnosis not present

## 2015-03-04 DIAGNOSIS — E871 Hypo-osmolality and hyponatremia: Secondary | ICD-10-CM

## 2015-03-04 DIAGNOSIS — D696 Thrombocytopenia, unspecified: Secondary | ICD-10-CM | POA: Diagnosis not present

## 2015-03-04 LAB — GI PATHOGEN PANEL BY PCR, STOOL
C DIFFICILE TOXIN A/B: NOT DETECTED
CAMPYLOBACTER BY PCR: NOT DETECTED
Cryptosporidium by PCR: NOT DETECTED
E COLI (ETEC) LT/ST: NOT DETECTED
E COLI 0157 BY PCR: NOT DETECTED
E coli (STEC): NOT DETECTED
G LAMBLIA BY PCR: NOT DETECTED
Norovirus GI/GII: NOT DETECTED
Rotavirus A by PCR: NOT DETECTED
SALMONELLA BY PCR: NOT DETECTED
Shigella by PCR: NOT DETECTED

## 2015-03-04 MED ORDER — HYDROCODONE-ACETAMINOPHEN 5-325 MG PO TABS: 1.0000 | ORAL_TABLET | ORAL | Status: DC | PRN

## 2015-03-04 NOTE — Patient Instructions (Addendum)
Please come in for a blood draw on Monday or Tuesday- we will check your labs and see how you are doing   Work on eating what you can, and drink gatorade/ water as tolerated Let's plan to have you return to work on Thursday 9/14  Continue to use the pain medication as needed- remember it can make you sleepy Take care!

## 2015-03-04 NOTE — Progress Notes (Signed)
[facility] [address], [place] [address] [phone]  Date:  03/04/2015   Name:  Brenda Rice   DOB:  [birth year]   MRN:  [id number]  PCP:  Abbe Amsterdam, MD    Chief Complaint: Follow-up   History of Present Illness:  Brenda Rice is a 59 y.o. very pleasant female patient who presents with the following:  Here today to follow-up after recent hospital stay 9/6 to 9/8 for hypertension, hyponatremia, hypokalemia, hypomagnesemia, nausea and vomting  needs follow-up CBB and BMP soon; however as just done yesterday will not repeat today  She had been ill with N/V/D prior to her admission- it seems this must have been the reason for her electrolyte disturbances.  She adamantly denies drinking any beer, and states she is drinking only an occasional glass of wine currently.  She had stopped her celexa about 3-4 months ago also.   She brings in her rx for hydrocodone 5 rx from the hospital- she had been taking hydrocodone  every 4 hours or so (due to recent lumbar laminectomy 02/09/15).  She does not think the amount of schedule of pain meds that she received (  QID, #20) will be enough. She asks me to destroy this rx and replace with a different one to avoid confusion.    Brenda Rice was planning on going back to work following her surgery this coming Monday.  She is worried about not being able to get back in light of her recent admission.  She has spoken with her case manager and has been cleared to be out next week as well if needed   Patient Active Problem List   Diagnosis Date Noted  . Dehydration   . Hypokalemia 03/01/2015  . Hypomagnesemia 03/01/2015  . Nausea vomiting and diarrhea 03/01/2015  . Lumbar foraminal stenosis 02/09/2015  . MDD (major depressive disorder) 10/06/2013  . Alcohol dependence 05/26/2013  . GAD (generalized anxiety disorder) 05/26/2013  . Depressive disorder 05/26/2013  . Benzodiazepine dependence 05/25/2013  . Alcohol  dependence syndrome 02/15/2013  . Suicide attempt by multiple drug overdose 02/15/2013  . Substance induced mood disorder 02/15/2013  . Nicotine dependence 02/15/2013  . COPD (chronic obstructive pulmonary disease) 02/15/2013  . Hyponatremia 07/05/2012  . Nicotine addiction 09/02/2011  . Hypertension 07/12/2011  . 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 Hypertension   . Suicide attempt     x 3  . Hx: UTI (urinary tract infection)   . COPD (chronic obstructive pulmonary disease)   . Depression     pt. reports that she has not taken anything for depression in a couple of months. States she was told not to take Celexa & Seroquel together. Pt. is vague & unsure of her PCP wishes as to what if any antidepressant she should be taking.   . Anxiety   . Neuromuscular disorder     spinal stenosis- lumbar   . Migraine     "@ least a few times/yr" (03/01/2015)  . Osteoarthritis     right hip  . Arthritis     "back" (03/01/2015)    Past Surgical History  Procedure Laterality Date  . Orif clavicle fracture Left 01/03/2010    w//pinning 2nd metacarpal    . Lumbar laminectomy/decompression microdiscectomy N/A 02/09/2015    Procedure: Right Lumbar three- four, Lumbar four- five Laminectomy;  Surgeon: Hilda Lias, MD;  Location: [facility];  Service: Neurosurgery;  Laterality: N/A;  L3-4 L4-5 Laminectomy  . Back surgery    . Fracture surgery      Social History  Substance Use Topics  . Smoking status: Current Every Day Smoker -- 1.50 packs/day for 44 years    Types: Cigarettes  . Smokeless tobacco: Never Used  . Alcohol Use: 2.4 oz/week    4 Glasses of wine per week     Comment: was drinking beer daily now down to total 1.5 cans a week, quit hard liquor 1 month ago, as of  01/2015- pt. reports that its less than it use to be but states " I am not an alcoholic"    Family History  Problem Relation Age of  Onset  . ADD / ADHD Neg Hx   . Anxiety disorder Neg Hx   . Alcohol abuse Neg Hx   . Bipolar disorder Neg Hx   . Dementia Neg Hx   . Depression Neg Hx   . Drug abuse Neg Hx   . OCD Neg Hx   . Schizophrenia Neg Hx     Allergies  Allergen Reactions  . Celexa [Citalopram Hydrobromide] Other (See Comments)    Hyponatremia thought due to SSRI    Medication list has been reviewed and updated.  Current Outpatient Prescriptions on File Prior to Visit  Medication Sig Dispense Refill  . ALPRAZolam (XANAX) 1 MG tablet Take 1 tablet (1 mg total) by mouth 2 (two) times daily. May take an additional 0.5 mg once a day if needed (Patient taking differently: Take 1 mg by mouth 2 (two) times daily. May take an additional 0.5 mg once a day if needed for anxiety) 230 tablet 1  . aspirin-acetaminophen-caffeine (EXCEDRIN MIGRAINE) 250-250-65 MG per tablet Take 2 tablets by mouth every 6 (six) hours as needed for headache or migraine.    Marland Kitchen HYDROcodone-acetaminophen (NORCO/VICODIN) 5-325 MG per tablet Take 1 tablet by mouth every 6 (six) hours as needed for severe pain. 20 tablet 0  . Hyprom-Naphaz-Polysorb-Zn Sulf (CLEAR EYES COMPLETE OP) Place 1 drop into both eyes daily as needed (dry eyes).     Marland Kitchen lisinopril (PRINIVIL,ZESTRIL) 40 MG tablet TAKE 1 TABLET BY MOUTH EVERY DAY FOR BLOOD PRESSURE CONTROL (Patient taking differently: Take 40 mg by mouth daily before breakfast. ) 90 tablet 3  . nicotine (NICODERM CQ - DOSED IN MG/24 HOURS) 21 mg/24hr patch Place 1 patch (21 mg total) onto the skin daily. 28 patch 0  . VENTOLIN HFA 108 (90 BASE) MCG/ACT inhaler INHALE 2 PUFFS INTO THE LUNGS EVERY 6 (SIX) HOURS AS NEEDED FOR WHEEZING. (Patient taking differently: INHALE 3 PUFFS INTO THE LUNGS EVERY 6 (SIX) HOURS AS NEEDED FOR WHEEZING.) 54 Inhaler 1  . Multiple Vitamin (MULTIVITAMIN WITH MINERALS) TABS tablet Take 1 tablet by mouth daily. (Patient not taking: Reported on 03/04/2015) 30 tablet 0  . ondansetron (ZOFRAN) 4  MG tablet Take 1 tablet (4 mg total) by mouth every 8 (eight) hours as needed for nausea or vomiting. (Patient not taking: Reported on 03/04/2015) 20 tablet 0   No current facility-administered medications on file prior to visit.    Review of Systems:  As per HPI- otherwise negative.   Physical Examination: Filed Vitals:   03/04/15 1408  BP: 138/80  Pulse: 89  Temp: 97.8 F (36.6 C)  Resp: 18   Filed Vitals:   03/04/15 1408  Height: 5\' 2"  (1.575 m)  Weight: 157 lb (71.215 kg)   Body mass index is  28.71 kg/(m^2). Ideal Body Weight: Weight in (lb) to have BMI = 25: 136.4  GEN: WDWN, NAD, Non-toxic, A & O x 3, looks well HEENT: Atraumatic, Normocephalic. Neck supple. No masses, No LAD. Ears and Nose: No external deformity. CV: RRR, No M/G/R. No JVD. No thrill. No extra heart sounds. PULM: CTA B, no wheezes, crackles, rhonchi. No retractions. No resp. distress. No accessory muscle use. EXTR: No c/c/e NEURO Normal gait.  PSYCH: Normally interactive. Conversant. Not depressed or anxious appearing.  Calm demeanor.    Assessment and Plan: Hyponatremia - Plan: Basic metabolic panel  Thrombocytopenia - Plan: CBC  Midline low back pain with right-sided sciatica - Plan: HYDROcodone-acetaminophen (NORCO/VICODIN) 5-325 MG per tablet  Destroyed the rx for #20 hydrocodone that she had from the hospital and replaced as below.  She will likely need more than #20 pills as she is recovering from her recent back surgery.    Prior to my rx today she received oxycodone 15, #90, a 15 day supply from Dr. Jeral Fruit on 02/10/15 Completed paperwork for [employer] today for her  She will come in for repeat labs in about 5 days.    Signed Abbe Amsterdam, MD

## 2015-03-07 ENCOUNTER — Other Ambulatory Visit (INDEPENDENT_AMBULATORY_CARE_PROVIDER_SITE_OTHER): Payer: Managed Care, Other (non HMO)

## 2015-03-07 ENCOUNTER — Encounter: Payer: Self-pay | Admitting: Family Medicine

## 2015-03-07 DIAGNOSIS — E871 Hypo-osmolality and hyponatremia: Secondary | ICD-10-CM

## 2015-03-07 DIAGNOSIS — D696 Thrombocytopenia, unspecified: Secondary | ICD-10-CM

## 2015-03-07 LAB — BASIC METABOLIC PANEL
BUN: 5 mg/dL — ABNORMAL LOW (ref 7–25)
CALCIUM: 9.8 mg/dL (ref 8.6–10.4)
CO2: 24 mmol/L (ref 20–31)
CREATININE: 0.54 mg/dL (ref 0.50–0.99)
Chloride: 94 mmol/L — ABNORMAL LOW (ref 98–110)
Glucose, Bld: 97 mg/dL (ref 65–99)
Potassium: 5.1 mmol/L (ref 3.5–5.3)
Sodium: 130 mmol/L — ABNORMAL LOW (ref 135–146)

## 2015-03-07 LAB — CBC
HCT: 39.8 % (ref 36.0–46.0)
Hemoglobin: 13.8 g/dL (ref 12.0–15.0)
MCH: 33.8 pg (ref 26.0–34.0)
MCHC: 34.7 g/dL (ref 30.0–36.0)
MCV: 97.5 fL (ref 78.0–100.0)
MPV: 10.8 fL (ref 8.6–12.4)
PLATELETS: 166 10*3/uL (ref 150–400)
RBC: 4.08 MIL/uL (ref 3.87–5.11)
RDW: 14.2 % (ref 11.5–15.5)
WBC: 7.4 10*3/uL (ref 4.0–10.5)

## 2015-03-16 ENCOUNTER — Other Ambulatory Visit: Payer: Self-pay | Admitting: Family Medicine

## 2015-03-16 ENCOUNTER — Encounter: Payer: Self-pay | Admitting: Family Medicine

## 2015-03-16 DIAGNOSIS — M5441 Lumbago with sciatica, right side: Secondary | ICD-10-CM

## 2015-03-17 MED ORDER — HYDROCODONE-ACETAMINOPHEN 5-325 MG PO TABS: 1.0000 | ORAL_TABLET | ORAL | Status: DC | PRN

## 2015-03-23 ENCOUNTER — Ambulatory Visit: Payer: Managed Care, Other (non HMO) | Admitting: Family Medicine

## 2015-03-24 ENCOUNTER — Telehealth: Payer: Self-pay | Admitting: Family Medicine

## 2015-03-28 NOTE — Telephone Encounter (Signed)
No note

## 2015-03-29 ENCOUNTER — Telehealth: Payer: Self-pay | Admitting: Family Medicine

## 2015-03-29 NOTE — Telephone Encounter (Signed)
Called to see about scheduling appointment for a mammogram;

## 2015-03-30 ENCOUNTER — Ambulatory Visit (INDEPENDENT_AMBULATORY_CARE_PROVIDER_SITE_OTHER): Payer: Managed Care, Other (non HMO) | Admitting: Family Medicine

## 2015-03-30 ENCOUNTER — Encounter: Payer: Self-pay | Admitting: Family Medicine

## 2015-03-30 VITALS — BP 150/90 | HR 100 | Temp 98.6°F | Resp 16 | Ht 62.0 in | Wt 159.4 lb

## 2015-03-30 DIAGNOSIS — M62830 Muscle spasm of back: Secondary | ICD-10-CM | POA: Diagnosis not present

## 2015-03-30 DIAGNOSIS — F4322 Adjustment disorder with anxiety: Secondary | ICD-10-CM | POA: Diagnosis not present

## 2015-03-30 DIAGNOSIS — E871 Hypo-osmolality and hyponatremia: Secondary | ICD-10-CM | POA: Diagnosis not present

## 2015-03-30 LAB — BASIC METABOLIC PANEL
BUN: 13 mg/dL (ref 7–25)
CALCIUM: 8.7 mg/dL (ref 8.6–10.4)
CO2: 24 mmol/L (ref 20–31)
CREATININE: 0.8 mg/dL (ref 0.50–0.99)
Chloride: 103 mmol/L (ref 98–110)
Glucose, Bld: 97 mg/dL (ref 65–99)
Potassium: 4.3 mmol/L (ref 3.5–5.3)
SODIUM: 137 mmol/L (ref 135–146)

## 2015-03-30 MED ORDER — HYDROCODONE-ACETAMINOPHEN 10-325 MG PO TABS: 1.0000 | ORAL_TABLET | Freq: Four times a day (QID) | ORAL | Status: AC | PRN

## 2015-03-30 MED ORDER — METHOCARBAMOL 500 MG PO TABS: 500.0000 mg | ORAL_TABLET | Freq: Three times a day (TID) | ORAL | Status: DC | PRN

## 2015-03-30 NOTE — Progress Notes (Signed)
[facility] [address], [place] [address] [phone]  Date:  03/30/2015   Name:  Brenda Rice   DOB:  [birth year]   MRN:  [id number]  PCP:  Abbe Amsterdam, MD    Chief Complaint: Pain management   History of Present Illness:  Brenda Rice is a 59 y.o. very pleasant female patient who presents with the following:  Here today to discuss pain management.  Last saw me on 9/9. She had a lumbar laminectomy on 8/17 per Dr. Jeral Fruit.  She did have her post- op follow-up, but he does not have anything further to offer her at this time.  She will see him in a couple of months.   She notes pain in her mid lower back. She is doing exercises at home which do help but they make her very sore Also, 2 weeks ago at work her office got moved at work- she had to move a lot of things to set up her new office.    She has seen PT just once- they gave her exercises to do on her own.  Most recent electrolytes showed mild hyponatremia, OW normal  I gave her #60 norco  on 9/22- sig to take every 4 hours.  These do help with her pain, but when she had to move her desk they were no longer controlling her pain.  She was taking them maybe every 3-4 hours.   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  Admits that she is taking often  of xanax a day instead of 2-2.5   Review of controlled substance database does not show any suspicious or unexpected entries    Patient Active Problem List   Diagnosis Date Noted  . Dehydration   . Hypokalemia 03/01/2015  . Hypomagnesemia 03/01/2015  . Nausea vomiting and diarrhea 03/01/2015  . Lumbar foraminal stenosis 02/09/2015  . MDD (major depressive disorder) (HCC) 10/06/2013  . Alcohol dependence (HCC) 05/26/2013  . GAD (generalized anxiety disorder) 05/26/2013  . Depressive  disorder 05/26/2013  . Benzodiazepine dependence (HCC) 05/25/2013  . Alcohol dependence syndrome (HCC) 02/15/2013  . Suicide attempt by multiple drug overdose (HCC) 02/15/2013  . Substance induced mood disorder (HCC) 02/15/2013  . Nicotine dependence 02/15/2013  . COPD (chronic obstructive pulmonary disease) (HCC) 02/15/2013  . Hyponatremia 07/05/2012  . Nicotine addiction 09/02/2011  . Hypertension 07/12/2011  . 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 Hypertension   . Suicide attempt (HCC)     x 3  . Hx: UTI (urinary tract infection)   . COPD (chronic obstructive pulmonary disease) (HCC)   . Depression     pt. reports that she has not taken anything for depression in a couple of months. States she was told not to take Celexa & Seroquel together. Pt. is vague & unsure of her PCP wishes as to what if any antidepressant she should be taking.   . Anxiety   . Neuromuscular disorder (HCC)     spinal stenosis- lumbar   . Migraine     "@ least a few times/yr" (03/01/2015)  . Osteoarthritis     right hip  . Arthritis     "back" (03/01/2015)  Past Surgical History  Procedure Laterality Date  . Orif clavicle fracture Left 01/03/2010    w//pinning 2nd metacarpal    . Lumbar laminectomy/decompression microdiscectomy N/A 02/09/2015    Procedure: Right Lumbar three- four, Lumbar four- five Laminectomy;  Surgeon: Hilda Lias, MD;  Location: [facility];  Service: Neurosurgery;  Laterality: N/A;  L3-4 L4-5 Laminectomy  . Back surgery    . Fracture surgery      Social History  Substance Use Topics  . Smoking status: Current Every Day Smoker -- 1.50 packs/day for 44 years    Types: Cigarettes  . Smokeless tobacco: Never Used  . Alcohol Use: 2.4 oz/week    4 Glasses of wine per week     Comment: was drinking beer daily now down to total 1.5 cans a week, quit hard liquor 1 month ago, as of  01/2015- pt.  reports that its less than it use to be but states " I am not an alcoholic"    Family History  Problem Relation Age of Onset  . ADD / ADHD Neg Hx   . Anxiety disorder Neg Hx   . Alcohol abuse Neg Hx   . Bipolar disorder Neg Hx   . Dementia Neg Hx   . Depression Neg Hx   . Drug abuse Neg Hx   . OCD Neg Hx   . Schizophrenia Neg Hx     Allergies  Allergen Reactions  . Celexa [Citalopram Hydrobromide] Other (See Comments)    Hyponatremia thought due to SSRI    Medication list has been reviewed and updated.  Current Outpatient Prescriptions on File Prior to Visit  Medication Sig Dispense Refill  . ALPRAZolam (XANAX) 1 MG tablet Take 1 tablet (1 mg total) by mouth 2 (two) times daily. May take an additional 0.5 mg once a day if needed (Patient taking differently: Take 1 mg by mouth 2 (two) times daily. May take an additional 0.5 mg once a day if needed for anxiety) 230 tablet 1  . HYDROcodone-acetaminophen (NORCO/VICODIN) 5-325 MG per tablet Take 1 tablet by mouth every 4 (four) hours as needed for severe pain. 60 tablet 0  . lisinopril (PRINIVIL,ZESTRIL) 40 MG tablet TAKE 1 TABLET BY MOUTH EVERY DAY FOR BLOOD PRESSURE CONTROL (Patient taking differently: Take 40 mg by mouth daily before breakfast. ) 90 tablet 3  . VENTOLIN HFA 108 (90 BASE) MCG/ACT inhaler INHALE 2 PUFFS INTO THE LUNGS EVERY 6 (SIX) HOURS AS NEEDED FOR WHEEZING. (Patient taking differently: INHALE 3 PUFFS INTO THE LUNGS EVERY 6 (SIX) HOURS AS NEEDED FOR WHEEZING.) 54 Inhaler 1  . nicotine (NICODERM CQ - DOSED IN MG/24 HOURS) 21 mg/24hr patch Place 1 patch (21 mg total) onto the skin daily. (Patient not taking: Reported on 03/30/2015) 28 patch 0   No current facility-administered medications on file prior to visit.    Review of Systems:  As per HPI- otherwise negative. He will get her flu shot at work for free   Physical Examination: Filed Vitals:   03/30/15 1527  BP: 150/90  Pulse: 100  Temp: 98.6 F (37 C)   Resp: 16   Filed Vitals:   03/30/15 1527  Height: 5\' 2"  (1.575 m)  Weight: 159 lb 6.4 oz (72.303 kg)   Body mass index is 29.15 kg/(m^2). Ideal Body Weight: Weight in (lb) to have BMI = 25: 136.4  GEN: WDWN, NAD, Non-toxic, A & O x 3, looks well, overweight HEENT: Atraumatic, Normocephalic. Neck supple. No masses, No LAD.  Ears and Nose: No external deformity. CV: RRR, No M/G/R. No JVD. No thrill. No extra heart sounds. PULM: CTA B, no wheezes, crackles, rhonchi. No retractions. No resp. distress. No accessory muscle use. EXTR: No c/c/e NEURO as below PSYCH: Normally interactive. Conversant. Not depressed or anxious appearing.  Calm demeanor.  Moves slowly and stiffly Well- healing lumbar incision Normal BLE strength and DTR.  She does notice some tingling in her bilateral anterior thighs but this has been present since her operation   Assessment and Plan: Hyponatremia - Plan: Basic metabolic panel  Adjustment disorder with anxious mood  Back spasm - Plan: methocarbamol (ROBAXIN) 500 MG tablet, HYDROcodone-acetaminophen (NORCO) 10-325 MG tablet  Treat for back pain and spasm as below  went over sedation risk in detail Scale back on xanax again Will check her BMP to eval for hyponatremia; she has been careful to drink sports drink and does not feel like her Na is low  Meds ordered this encounter  Medications  . OVER THE COUNTER MEDICATION    Sig: OTC Bausch-Lomb eye drops taking prn  . methocarbamol (ROBAXIN) 500 MG tablet    Sig: Take 1 tablet (500 mg total) by mouth every 8 (eight) hours as needed.    Dispense:  30 tablet    Refill:  0  . HYDROcodone-acetaminophen (NORCO) 10-325 MG tablet    Sig: Take 1 tablet by mouth every 6 (six) hours as needed.    Dispense:  60 tablet    Refill:  0     Signed Abbe Amsterdam, MD

## 2015-03-30 NOTE — Patient Instructions (Addendum)
Use the hydrocodone 10 as needed- you can also certainly break these in half if you do not need 10 mg The robaxin (muscle relaxer) may also help Please cut your xanax back to your usual level Remember that the hydrocodone, robaxin and xanax can all make you sleepy.   The combination of xanax and hydrocodone can be especially potent- you might split your xanax if taking both At night I would recommend that you take robaxin OR hydrocodone, not both unless absolutely necessary Titrate your use of these medications so that you have relief of your symptoms but not excessive sedation

## 2015-04-13 ENCOUNTER — Other Ambulatory Visit: Payer: Self-pay | Admitting: Family Medicine

## 2015-04-18 NOTE — Telephone Encounter (Signed)
Dr Patsy Lageropland, we have Rxd this for pt, but not for quite a while. You saw her on 10/5 and Rxd Norco. Do you want to Rx Tramadol, or RTC?

## 2015-04-27 ENCOUNTER — Other Ambulatory Visit: Payer: Self-pay | Admitting: Neurosurgery

## 2015-04-27 DIAGNOSIS — M5416 Radiculopathy, lumbar region: Secondary | ICD-10-CM

## 2015-04-29 ENCOUNTER — Ambulatory Visit
Admission: RE | Admit: 2015-04-29 | Discharge: 2015-04-29 | Disposition: A | Discharge status: Home / Self Care | Payer: 59 | Source: Ambulatory Visit | Attending: Neurosurgery | Admitting: Neurosurgery

## 2015-04-29 VITALS — BP 140/60 | HR 80

## 2015-04-29 DIAGNOSIS — M5416 Radiculopathy, lumbar region: Secondary | ICD-10-CM

## 2015-04-29 DIAGNOSIS — M48061 Spinal stenosis, lumbar region without neurogenic claudication: Secondary | ICD-10-CM

## 2015-04-29 MED ORDER — IOHEXOL 180 MG/ML  SOLN: 20.0000 mL | Freq: Once | INTRAMUSCULAR | Status: DC | PRN

## 2015-04-29 MED ORDER — DIAZEPAM 5 MG PO TABS
5.0000 mg | ORAL_TABLET | Freq: Once | ORAL | Status: AC
Start: 2015-04-29 — End: 2015-04-29
  Administered 2015-04-29: 5 mg via ORAL

## 2015-04-29 NOTE — Discharge Instructions (Signed)

## 2015-04-29 NOTE — Progress Notes (Signed)
Pt states she has been off Tramadol for the past 2 days. 

## 2015-07-08 ENCOUNTER — Emergency Department (HOSPITAL_COMMUNITY)
Admission: EM | Admit: 2015-07-08 | Discharge: 2015-07-08 | Discharge status: Against Medical Advice | Payer: Managed Care, Other (non HMO) | Attending: Orthopedic Surgery | Admitting: Orthopedic Surgery

## 2015-07-08 ENCOUNTER — Emergency Department (HOSPITAL_COMMUNITY): Payer: Managed Care, Other (non HMO)

## 2015-07-08 ENCOUNTER — Encounter (HOSPITAL_COMMUNITY): Payer: Self-pay | Admitting: Emergency Medicine

## 2015-07-08 DIAGNOSIS — F17210 Nicotine dependence, cigarettes, uncomplicated: Secondary | ICD-10-CM | POA: Diagnosis not present

## 2015-07-08 DIAGNOSIS — R109 Unspecified abdominal pain: Secondary | ICD-10-CM | POA: Insufficient documentation

## 2015-07-08 DIAGNOSIS — J449 Chronic obstructive pulmonary disease, unspecified: Secondary | ICD-10-CM | POA: Insufficient documentation

## 2015-07-08 DIAGNOSIS — R079 Chest pain, unspecified: Secondary | ICD-10-CM | POA: Diagnosis present

## 2015-07-08 DIAGNOSIS — I10 Essential (primary) hypertension: Secondary | ICD-10-CM | POA: Diagnosis not present

## 2015-07-08 LAB — URINALYSIS, ROUTINE W REFLEX MICROSCOPIC
BILIRUBIN URINE: NEGATIVE
GLUCOSE, UA: NEGATIVE mg/dL
Ketones, ur: NEGATIVE mg/dL
Leukocytes, UA: NEGATIVE
NITRITE: NEGATIVE
PH: 7 (ref 5.0–8.0)
Protein, ur: 100 mg/dL — AB
SPECIFIC GRAVITY, URINE: 1.016 (ref 1.005–1.030)

## 2015-07-08 LAB — URINE MICROSCOPIC-ADD ON

## 2015-07-08 LAB — BASIC METABOLIC PANEL
Anion gap: 10 (ref 5–15)
CALCIUM: 9.1 mg/dL (ref 8.9–10.3)
CHLORIDE: 94 mmol/L — AB (ref 101–111)
CO2: 27 mmol/L (ref 22–32)
CREATININE: 0.68 mg/dL (ref 0.44–1.00)
GFR calc non Af Amer: >60 mL/min (ref >60)
GLUCOSE: 123 mg/dL — AB (ref 65–99)
Potassium: 4.4 mmol/L (ref 3.5–5.1)
Sodium: 131 mmol/L — ABNORMAL LOW (ref 135–145)

## 2015-07-08 LAB — CBC
HEMATOCRIT: 39.4 % (ref 36.0–46.0)
HEMOGLOBIN: 14.2 g/dL (ref 12.0–15.0)
MCH: 35.2 pg — ABNORMAL HIGH (ref 26.0–34.0)
MCHC: 36 g/dL (ref 30.0–36.0)
MCV: 97.8 fL (ref 78.0–100.0)
Platelets: 97 10*3/uL — ABNORMAL LOW (ref 150–400)
RBC: 4.03 MIL/uL (ref 3.87–5.11)
RDW: 13.8 % (ref 11.5–15.5)
WBC: 11.5 10*3/uL — ABNORMAL HIGH (ref 4.0–10.5)

## 2015-07-08 LAB — I-STAT TROPONIN, ED: Troponin i, poc: 0 ng/mL (ref 0.00–0.08)

## 2015-07-08 LAB — LIPASE, BLOOD: Lipase: 16 U/L (ref 11–51)

## 2015-07-08 MED ORDER — ONDANSETRON 4 MG PO TBDP: 4.0000 mg | ORAL_TABLET | Freq: Once | ORAL | Status: DC

## 2015-07-08 NOTE — ED Notes (Signed)
Patient here with complaint of chest pain, SOB, and abdominal pain which began last night. Patient states she woke in a pool of sweat with her sheets soaked. Unsure of fever. Levines sign in triage. Patient also with intermittent cough which she states induces nausea.

## 2015-07-08 NOTE — ED Notes (Signed)
Pt asking to leave due to wait. Pt offered zofran for nausea. Pt states she has phenergen at home that she will take. This RN rechecked HR and pulse ox. Pt still wanting to leave

## 2015-07-11 ENCOUNTER — Telehealth: Payer: Self-pay

## 2015-07-11 ENCOUNTER — Ambulatory Visit (INDEPENDENT_AMBULATORY_CARE_PROVIDER_SITE_OTHER): Payer: Managed Care, Other (non HMO) | Admitting: Family Medicine

## 2015-07-11 VITALS — BP 142/88 | HR 102 | Temp 98.3°F | Resp 18 | Ht 62.0 in | Wt 157.0 lb

## 2015-07-11 DIAGNOSIS — R062 Wheezing: Secondary | ICD-10-CM

## 2015-07-11 DIAGNOSIS — R5381 Other malaise: Secondary | ICD-10-CM

## 2015-07-11 DIAGNOSIS — I10 Essential (primary) hypertension: Secondary | ICD-10-CM

## 2015-07-11 LAB — POCT INFLUENZA A/B
INFLUENZA B, POC: NEGATIVE
Influenza A, POC: NEGATIVE

## 2015-07-11 MED ORDER — IPRATROPIUM BROMIDE 0.02 % IN SOLN
0.5000 mg | Freq: Once | RESPIRATORY_TRACT | Status: AC
  Administered 2015-07-11: 0.5 mg via RESPIRATORY_TRACT

## 2015-07-11 MED ORDER — ALBUTEROL SULFATE HFA 108 (90 BASE) MCG/ACT IN AERS: INHALATION_SPRAY | RESPIRATORY_TRACT | Status: AC

## 2015-07-11 MED ORDER — DOXYCYCLINE HYCLATE 100 MG PO CAPS: 100.0000 mg | ORAL_CAPSULE | Freq: Two times a day (BID) | ORAL | Status: DC

## 2015-07-11 MED ORDER — PREDNISONE 20 MG PO TABS: ORAL_TABLET | ORAL | Status: DC

## 2015-07-11 MED ORDER — ALBUTEROL SULFATE (2.5 MG/3ML) 0.083% IN NEBU
2.5000 mg | INHALATION_SOLUTION | Freq: Once | RESPIRATORY_TRACT | Status: AC
Start: 2015-07-11 — End: 2015-07-11
  Administered 2015-07-11: 2.5 mg via RESPIRATORY_TRACT

## 2015-07-11 NOTE — Patient Instructions (Signed)
We are going to treat you for bronchitis/ COPD exacerbation with prednisone and the antibiotics Continue to use the albuterol inhaler as needed Please let me know if you do not feel better in the next couple of days- Sooner if worse.

## 2015-07-11 NOTE — Telephone Encounter (Signed)
Pt called stating that the regular mucinex // pt states husband purchased generic version and was concerned that this would not be comparable... Informed pt that I could leave a message for PCP or clinical staff but her pharmacist could also answer this question quickly. She planned to reach out to the pharmacy.    [phone]

## 2015-07-11 NOTE — Progress Notes (Signed)
[facility] [address] [place] [address] [phone]  Date:  07/11/2015   Name:  Brenda Rice   DOB:  07/09/1955   MRN:  [id number]  PCP:  Abbe Amsterdam, MD    Chief Complaint: Shortness of Breath and Nasal Congestion   History of Present Illness:  Brenda Rice is a 60 y.o. very pleasant female patient who presents with the following:  Here today with illness.  Today is Monday. This past Thursday she went to work but started to feel bad- she had hot and cold flashes, runny nose.  Friday 1/13 she did not go to work because she was too sick, was in the ER that day but did not end up being seen by a provider due to wait. They did check her BMP, CBC and troponin as per EMR.  Her sodium was ok at 131- she tends to have some baseline hyponatremia.  She even had a CXR- this showed peribronchial thickening.   She is still ill- she "can't sleep, can't eat.  I have taken nyquil, dayquil, my phenergan to keep my stomach from rolling and feeling like its on fire."    When she lays down she has wheezing, coughing, chocking on mucus- this makes it hard to sleep She tried some mucinex.    She has not noted a fever She has noted some loose stool  She was able to eat a few bites today.  She is drinking fluids but trying to avoid water due to her hyponatremia history- she is drinking gatorade.    She did use her albuterol inhaler today- last dose around 4 hours ago.   No history of glaucoma    Patient Active Problem List   Diagnosis Date Noted  . Dehydration   . Hypokalemia 03/01/2015  . Hypomagnesemia 03/01/2015  . Nausea vomiting and diarrhea 03/01/2015  . Lumbar foraminal stenosis 02/09/2015  . MDD (major depressive disorder) (HCC) 10/06/2013  . Alcohol dependence (HCC) 05/26/2013  . GAD (generalized anxiety disorder) 05/26/2013  . Depressive disorder 05/26/2013  . Benzodiazepine dependence (HCC) 05/25/2013  . Alcohol dependence syndrome (HCC) 02/15/2013   . Suicide attempt by multiple drug overdose (HCC) 02/15/2013  . Substance induced mood disorder (HCC) 02/15/2013  . Nicotine dependence 02/15/2013  . COPD (chronic obstructive pulmonary disease) (HCC) 02/15/2013  . Hyponatremia 07/05/2012  . Nicotine addiction 09/02/2011  . Hypertension 07/12/2011  . 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 Hypertension   . Suicide attempt (HCC)     x 3  . Hx: UTI (urinary tract infection)   . COPD (chronic obstructive pulmonary disease) (HCC)   . Depression     pt. reports that she has not taken anything for depression in a couple of months. States she was told not to take Celexa & Seroquel together. Pt. is vague & unsure of her PCP wishes as to what if any antidepressant she should be taking.   . Anxiety   . Neuromuscular disorder (HCC)     spinal stenosis- lumbar   . Migraine     "@ least a few times/yr" (03/01/2015)  . Osteoarthritis     right hip  . Arthritis     "back" (03/01/2015)    Past Surgical History  Procedure Laterality Date  . Orif clavicle fracture Left 01/03/2010    w//pinning 2nd metacarpal    . Lumbar laminectomy/decompression microdiscectomy N/A 02/09/2015  Procedure: Right Lumbar three- four, Lumbar four- five Laminectomy;  Surgeon: Hilda Lias, MD;  Location: [facility];  Service: Neurosurgery;  Laterality: N/A;  L3-4 L4-5 Laminectomy  . Back surgery    . Fracture surgery      Social History  Substance Use Topics  . Smoking status: Current Every Day Smoker -- 1.50 packs/day for 44 years    Types: Cigarettes  . Smokeless tobacco: Never Used  . Alcohol Use: 2.4 oz/week    4 Glasses of wine per week     Comment: was drinking beer daily now down to total 1.5 cans a week, quit hard liquor 1 month ago, as of  01/2015- pt. reports that its less than it use to be but states " I am not an alcoholic"    Family History  Problem Relation Age  of Onset  . ADD / ADHD Neg Hx   . Anxiety disorder Neg Hx   . Alcohol abuse Neg Hx   . Bipolar disorder Neg Hx   . Dementia Neg Hx   . Depression Neg Hx   . Drug abuse Neg Hx   . OCD Neg Hx   . Schizophrenia Neg Hx     Allergies  Allergen Reactions  . Celexa [Citalopram Hydrobromide] Other (See Comments)    Hyponatremia thought due to SSRI    Medication list has been reviewed and updated.  Current Outpatient Prescriptions on File Prior to Visit  Medication Sig Dispense Refill  . ALPRAZolam (XANAX) 1 MG tablet Take 1 tablet (1 mg total) by mouth 2 (two) times daily. May take an additional 0.5 mg once a day if needed (Patient taking differently: Take 1 mg by mouth 2 (two) times daily. May take an additional 0.5 mg once a day if needed for anxiety) 230 tablet 1  . HYDROcodone-acetaminophen (NORCO) 10-325 MG tablet Take 1 tablet by mouth every 6 (six) hours as needed. 60 tablet 0  . lisinopril (PRINIVIL,ZESTRIL) 40 MG tablet TAKE 1 TABLET BY MOUTH EVERY DAY FOR BLOOD PRESSURE CONTROL (Patient taking differently: Take 40 mg by mouth daily before breakfast. ) 90 tablet 3  . OVER THE COUNTER MEDICATION OTC Bausch-Lomb eye drops taking prn    . traMADol (ULTRAM) 50 MG tablet TAKE 1 TABLET BY MOUTH EVERY 8 TO 12 HOURS AS NEEDED FOR PAIN 40 tablet 0  . VENTOLIN HFA 108 (90 BASE) MCG/ACT inhaler INHALE 2 PUFFS INTO THE LUNGS EVERY 6 (SIX) HOURS AS NEEDED FOR WHEEZING. (Patient taking differently: INHALE 3 PUFFS INTO THE LUNGS EVERY 6 (SIX) HOURS AS NEEDED FOR WHEEZING.) 54 Inhaler 1  . methocarbamol (ROBAXIN) 500 MG tablet Take 1 tablet (500 mg total) by mouth every 8 (eight) hours as needed. (Patient not taking: Reported on 07/11/2015) 30 tablet 0  . nicotine (NICODERM CQ - DOSED IN MG/24 HOURS) 21 mg/24hr patch Place 1 patch (21 mg total) onto the skin daily. (Patient not taking: Reported on 03/30/2015) 28 patch 0   No current facility-administered medications on file prior to visit.    Review  of Systems:  As per HPI- otherwise negative.   Physical Examination: Filed Vitals:   07/11/15 1249  BP: 172/88  Pulse: 103  Temp: 98.3 F (36.8 C)  Resp: 18   Filed Vitals:   07/11/15 1249  Height: 5\' 2"  (1.575 m)  Weight: 157 lb (71.215 kg)   Body mass index is 28.71 kg/(m^2). Ideal Body Weight: Weight in (lb) to have BMI = 25: 136.4  GEN:  WDWN, NAD, Non-toxic, A & O x 3, coughing and bringing up mucus in room.   HEENT: Atraumatic, Normocephalic. Neck supple. No masses, No LAD. Ears and Nose: No external deformity. CV: RRR, No M/G/R. No JVD. No thrill. No extra heart sounds. PULM: No retractions. No resp. distress. No accessory muscle use. Diffuse course wheezing and ronchi ABD: S, NT, ND, +BS. No rebound. No HSM. EXTR: No c/c/e NEURO Normal gait.  PSYCH: Normally interactive. Conversant. Not depressed or anxious appearing.  Calm demeanor.   Results for orders placed or performed in visit on 07/11/15  POCT Influenza A/B  Result Value Ref Range   Influenza A, POC Negative Negative   Influenza B, POC Negative Negative   Treated with duoneb- this did help her somewhat and decreased her wheezing  Did not repeat CXR as this was done 3 days ago as below 07/08/2015: CHEST 2 VIEW  COMPARISON: Chest radiograph from 03/01/2015  FINDINGS: The lungs are well-aerated. Chronically increased interstitial markings are seen, with peribronchial thickening. There is no evidence of focal opacification, pleural effusion or pneumothorax.  The heart is normal in size; the mediastinal contour is within normal limits. No acute osseous abnormalities are seen. Chronic right-sided rib deformities are noted.  IMPRESSION: Chronically increased interstitial markings seen, with peribronchial thickening. Lungs otherwise grossly clear.  Assessment and Plan: Wheezing - Plan: POCT Influenza A/B, albuterol (PROVENTIL) (2.5 MG/3ML) 0.083% nebulizer solution 2.5 mg, ipratropium (ATROVENT)  nebulizer solution 0.5 mg, albuterol (VENTOLIN HFA) 108 (90 Base) MCG/ACT inhaler, doxycycline (VIBRAMYCIN) 100 MG capsule, predniSONE (DELTASONE) 20 MG tablet  Malaise - Plan: POCT Influenza A/B, albuterol (PROVENTIL) (2.5 MG/3ML) 0.083% nebulizer solution 2.5 mg, ipratropium (ATROVENT) nebulizer solution 0.5 mg  Essential hypertension  Here today with COPD exacerbation and bronchitis She does not have a nebulizer machine and does not really want to get one so did not rx duoneb. Continue albuterol prn Prednisone taper Doxycycline  See patient instructions for more details.   She has not smoked in several days- encouraged her to keep this up Close follow-up if not better  Signed Fantasy Donald,   [place] [facility] [place]

## 2015-07-13 ENCOUNTER — Ambulatory Visit (INDEPENDENT_AMBULATORY_CARE_PROVIDER_SITE_OTHER): Payer: Managed Care, Other (non HMO) | Admitting: Family Medicine

## 2015-07-13 ENCOUNTER — Telehealth: Payer: Self-pay | Admitting: *Deleted

## 2015-07-13 ENCOUNTER — Encounter: Payer: Self-pay | Admitting: Family Medicine

## 2015-07-13 VITALS — BP 164/90 | HR 88 | Temp 98.0°F | Resp 16 | Ht 62.5 in | Wt 155.2 lb

## 2015-07-13 DIAGNOSIS — H6591 Unspecified nonsuppurative otitis media, right ear: Secondary | ICD-10-CM | POA: Diagnosis not present

## 2015-07-13 DIAGNOSIS — R062 Wheezing: Secondary | ICD-10-CM | POA: Diagnosis not present

## 2015-07-13 DIAGNOSIS — F411 Generalized anxiety disorder: Secondary | ICD-10-CM | POA: Diagnosis not present

## 2015-07-13 DIAGNOSIS — R0989 Other specified symptoms and signs involving the circulatory and respiratory systems: Secondary | ICD-10-CM | POA: Diagnosis not present

## 2015-07-13 DIAGNOSIS — I10 Essential (primary) hypertension: Secondary | ICD-10-CM | POA: Diagnosis not present

## 2015-07-13 MED ORDER — IPRATROPIUM BROMIDE 0.02 % IN SOLN: 0.5000 mg | Freq: Once | RESPIRATORY_TRACT | Status: AC

## 2015-07-13 MED ORDER — ALBUTEROL SULFATE (2.5 MG/3ML) 0.083% IN NEBU: 2.5000 mg | INHALATION_SOLUTION | Freq: Once | RESPIRATORY_TRACT | Status: AC

## 2015-07-13 MED ORDER — PREDNISONE 20 MG PO TABS: ORAL_TABLET | ORAL | Status: DC

## 2015-07-13 MED ORDER — AMLODIPINE BESYLATE 5 MG PO TABS: 5.0000 mg | ORAL_TABLET | Freq: Every day | ORAL | Status: DC

## 2015-07-13 MED ORDER — ALPRAZOLAM 1 MG PO TABS: 1.0000 mg | ORAL_TABLET | Freq: Two times a day (BID) | ORAL | Status: AC

## 2015-07-13 NOTE — Telephone Encounter (Signed)
Faxed prescription for Xanax to [facility], per  Dr Patsy Lager. Confirmation page received at 2:05 pm.

## 2015-07-13 NOTE — Progress Notes (Signed)
[facility] [address], [place] [address] [phone]  Date:  07/13/2015   Name:  Brenda Rice   DOB:  08-26-1955   MRN:  [id number]  PCP:  Abbe Amsterdam, MD    Chief Complaint: Follow-up and Headache   History of Present Illness:  Brenda Rice is a 60 y.o. very pleasant female patient who presents with the following:  Here today for a recheck- I saw her 2 days ago on Monday with illness- she noted wheezing, coughing, choking on mucus, loose stools.  Her CXR when she was in the ER on 1/13 showed peripbronchial thickening.   We treated her with a duoneb treatment, prednisone, doxycycline.   Here today because she notes that she now has a lot of pressure in her ears.  Yesterday upon awakening she noted that her hearing was decreased and her ears seems to be very congested and blocked  She was supposed to return to work today- however when she awoke at 0400 she felt that her ears were still too bad for her to RTW The left ear is better but the right ear is not This is giving her headaches "I feel like my head is in a tunnell." She just used a goody powder which is helping with the HA She is getting mucus from her lungs and from her sinuses.    She is a long term smoker but has not smoked in several days since she got sick.  She hopes this may lead to her quitting.  Her husband is still smoking however  She does feel like her breathing is now better- the main problem now is with her ears  Patient Active Problem List   Diagnosis Date Noted  . Dehydration   . Hypokalemia 03/01/2015  . Hypomagnesemia 03/01/2015  . Nausea vomiting and diarrhea 03/01/2015  . Lumbar foraminal stenosis 02/09/2015  . MDD (major depressive disorder) (HCC) 10/06/2013  . Alcohol dependence (HCC) 05/26/2013  . GAD (generalized anxiety disorder) 05/26/2013  . Depressive disorder 05/26/2013  . Benzodiazepine dependence (HCC) 05/25/2013  . Alcohol dependence syndrome (HCC)  02/15/2013  . Suicide attempt by multiple drug overdose (HCC) 02/15/2013  . Substance induced mood disorder (HCC) 02/15/2013  . Nicotine dependence 02/15/2013  . COPD (chronic obstructive pulmonary disease) (HCC) 02/15/2013  . Hyponatremia 07/05/2012  . Nicotine addiction 09/02/2011  . Hypertension 07/12/2011  . 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 Hypertension   . Suicide attempt (HCC)     x 3  . Hx: UTI (urinary tract infection)   . COPD (chronic obstructive pulmonary disease) (HCC)   . Depression     pt. reports that she has not taken anything for depression in a couple of months. States she was told not to take Celexa & Seroquel together. Pt. is vague & unsure of her PCP wishes as to what if any antidepressant she should be taking.   . Anxiety   . Neuromuscular disorder (HCC)     spinal stenosis- lumbar   . Migraine     "@ least a few times/yr" (03/01/2015)  . Osteoarthritis     right hip  . Arthritis     "back" (03/01/2015)    Past Surgical History  Procedure Laterality Date  . Orif clavicle fracture Left 01/03/2010    w//pinning 2nd metacarpal    . Lumbar laminectomy/decompression microdiscectomy N/A 02/09/2015    Procedure:  Right Lumbar three- four, Lumbar four- five Laminectomy;  Surgeon: Hilda Lias, MD;  Location: [facility];  Service: Neurosurgery;  Laterality: N/A;  L3-4 L4-5 Laminectomy  . Back surgery    . Fracture surgery      Social History  Substance Use Topics  . Smoking status: Current Every Day Smoker -- 1.50 packs/day for 44 years    Types: Cigarettes  . Smokeless tobacco: Never Used  . Alcohol Use: 2.4 oz/week    4 Glasses of wine per week     Comment: was drinking beer daily now down to total 1.5 cans a week, quit hard liquor 1 month ago, as of  01/2015- pt. reports that its less than it use to be but states " I am not an alcoholic"    Family History  Problem  Relation Age of Onset  . ADD / ADHD Neg Hx   . Anxiety disorder Neg Hx   . Alcohol abuse Neg Hx   . Bipolar disorder Neg Hx   . Dementia Neg Hx   . Depression Neg Hx   . Drug abuse Neg Hx   . OCD Neg Hx   . Schizophrenia Neg Hx     Allergies  Allergen Reactions  . Celexa [Citalopram Hydrobromide] Other (See Comments)    Hyponatremia thought due to SSRI    Medication list has been reviewed and updated.  Current Outpatient Prescriptions on File Prior to Visit  Medication Sig Dispense Refill  . albuterol (VENTOLIN HFA) 108 (90 Base) MCG/ACT inhaler INHALE 2 PUFFS INTO THE LUNGS EVERY 6 (SIX) HOURS AS NEEDED FOR WHEEZING. 54 Inhaler 1  . doxycycline (VIBRAMYCIN) 100 MG capsule Take 1 capsule (100 mg total) by mouth 2 (two) times daily. 20 capsule 0  . HYDROcodone-acetaminophen (NORCO) 10-325 MG tablet Take 1 tablet by mouth every 6 (six) hours as needed. 60 tablet 0  . lisinopril (PRINIVIL,ZESTRIL) 40 MG tablet TAKE 1 TABLET BY MOUTH EVERY DAY FOR BLOOD PRESSURE CONTROL (Patient taking differently: Take 40 mg by mouth daily before breakfast. ) 90 tablet 3  . OVER THE COUNTER MEDICATION OTC Bausch-Lomb eye drops taking prn    . predniSONE (DELTASONE) 20 MG tablet Take 2 pills a day for 3 days, then 1 pill a day for 3 days 9 tablet 0  . traMADol (ULTRAM) 50 MG tablet TAKE 1 TABLET BY MOUTH EVERY 8 TO 12 HOURS AS NEEDED FOR PAIN 40 tablet 0   No current facility-administered medications on file prior to visit.    Review of Systems:  As per HPI- otherwise negative.   Physical Examination: Filed Vitals:   07/13/15 1357 07/13/15 1400  BP: 170/92 172/81  Pulse: 96   Temp: 98 F (36.7 C)   Resp: 16    Filed Vitals:   07/13/15 1357  Height: 5' 2.5" (1.588 m)  Weight: 155 lb 3.2 oz (70.398 kg)   Body mass index is 27.92 kg/(m^2). Ideal Body Weight: Weight in (lb) to have BMI = 25: 138.6  GEN: WDWN, NAD, Non-toxic, A & O x 3, looks well and better than Monday.  Does smell of  tobacco HEENT: Atraumatic, Normocephalic. Neck supple. No masses, No LAD. Left TM wnl, right TM is bulging with a clear effusion but no sign of infection, oropharynx normal.  PEERL,EOMI.   Ears and Nose: No external deformity. CV: RRR, No M/G/R. No JVD. No thrill. No extra heart sounds. PULM: continues to have bilateral wheezing and ronchi. No retractions. No resp. distress.  No accessory muscle use. ABD: S, NT, ND, +BS. No rebound. No HSM. EXTR: No c/c/e NEURO Normal gait.  PSYCH: Normally interactive. Conversant. Not depressed or anxious appearing.  Calm demeanor.   Given a duoneb treatment - she was somewhat better, lung sounds improved, sats to 94% Assessment and Plan: GAD (generalized anxiety disorder) - Plan: ALPRAZolam (XANAX) 1 MG tablet  Middle ear effusion, right  Wheezing - Plan: albuterol (PROVENTIL) (2.5 MG/3ML) 0.083% nebulizer solution 2.5 mg, ipratropium (ATROVENT) nebulizer solution 0.5 mg, predniSONE (DELTASONE) 20 MG tablet  Chest congestion - Plan: albuterol (PROVENTIL) (2.5 MG/3ML) 0.083% nebulizer solution 2.5 mg, ipratropium (ATROVENT) nebulizer solution 0.5 mg  Essential hypertension - Plan: amLODipine (NORVASC) 5 MG tablet  Here today to follow-up from illness and because she needs a note for work.  Gave her a note for today through Monday as she feels too ill to return.  Her main issue right now if right ear pain due to effusion, however she also continues to have wheezing and chest congestion  She has taken 40 mg of prednisone the last 2 days without difficulty.  Will have her increase to  for 2 days to see if this will relieve the pressure in her ears and clear up wheezing, then resume 40 for 3 days and 20 for 3 days  Will have her go back on norvasc 5 as her BP is high  Signed Abbe Amsterdam, MD

## 2015-07-13 NOTE — Patient Instructions (Addendum)
Increase your prednisone to 3 pills today and tomorrow (60 mg) and then take 2 a day for 3 days and 1 a day for 3 days   Use the mucinex D as needed  Your blood pressure keeps running high- let's try adding  of amlodipine to what you are taking.  I will give you an rx for this but if you have some at home go ahead and take that first   Let me know if not better in the next couple of days I will see what I can do as far as getting you some inhalers

## 2015-07-15 ENCOUNTER — Encounter: Payer: Self-pay | Admitting: Family Medicine

## 2015-07-18 ENCOUNTER — Telehealth: Payer: Self-pay

## 2015-07-18 NOTE — Telephone Encounter (Signed)
Pt wants a return call back from dr copland and would like for her to extend her note thru today -she was unable to go today

## 2015-07-18 NOTE — Telephone Encounter (Signed)
Called her back- she plans to return to work tomorrow.  She states that she was about to come in and see me but then her ears finally popped today and felt much better!  She is just finishing out her prednisone but it is keeping her awake.  She has 2 days of 20 mg left- she will cut down to 10 mg the next 2 days to finish the taper

## 2015-07-20 ENCOUNTER — Encounter: Payer: Self-pay | Admitting: Family Medicine

## 2015-08-03 ENCOUNTER — Ambulatory Visit (INDEPENDENT_AMBULATORY_CARE_PROVIDER_SITE_OTHER): Payer: Managed Care, Other (non HMO) | Admitting: Family Medicine

## 2015-08-03 VITALS — BP 120/72 | HR 102 | Temp 98.0°F | Resp 17 | Ht 63.0 in | Wt 158.0 lb

## 2015-08-03 DIAGNOSIS — G43009 Migraine without aura, not intractable, without status migrainosus: Secondary | ICD-10-CM | POA: Diagnosis not present

## 2015-08-03 DIAGNOSIS — G43001 Migraine without aura, not intractable, with status migrainosus: Secondary | ICD-10-CM | POA: Diagnosis not present

## 2015-08-03 DIAGNOSIS — E871 Hypo-osmolality and hyponatremia: Secondary | ICD-10-CM | POA: Diagnosis not present

## 2015-08-03 DIAGNOSIS — G43909 Migraine, unspecified, not intractable, without status migrainosus: Secondary | ICD-10-CM | POA: Insufficient documentation

## 2015-08-03 LAB — BASIC METABOLIC PANEL
BUN: 4 mg/dL — AB (ref 7–25)
CALCIUM: 9.3 mg/dL (ref 8.6–10.4)
CO2: 21 mmol/L (ref 20–31)
CREATININE: 0.56 mg/dL (ref 0.50–0.99)
Chloride: 97 mmol/L — ABNORMAL LOW (ref 98–110)
Glucose, Bld: 84 mg/dL (ref 65–99)
Potassium: 5 mmol/L (ref 3.5–5.3)
Sodium: 130 mmol/L — ABNORMAL LOW (ref 135–146)

## 2015-08-03 MED ORDER — TRAMADOL HCL 50 MG PO TABS: ORAL_TABLET | ORAL | Status: AC

## 2015-08-03 NOTE — Progress Notes (Signed)
[facility] [address] [place] [address] [phone]  Date:  08/03/2015   Name:  Brenda Rice   DOB:  1956/03/05   MRN:  [id number]  PCP:  Abbe Amsterdam, MD    Chief Complaint: Migraine and Hypertension   History of Present Illness:  Brenda Rice is a 60 y.o. very pleasant female patient who presents with the following:  History of multifactorial hyponatremia, migraine HA, depression, tobacco use Yesterday at work around 10:30 she felt like her BP went up, she "got a massive migraine headache."  Her BP was 210/103; the nurse checked her BP at work. She went home, took 5 mg of amlodipine.  Checked her BP 30 min later and got 167/93.   Today her BP is normal and her HA is improved.    Yesterday she had nausea, vomiting and diarrhea.  She is better today but she did not try to eat anything.  She is able to drink liquids however She notes that her migraine seems like a typical migraine to her- she does not have any weakness, numbness, slurred speech or facial drooping.  This is not an unusual HA or worst HA of life. Her main concern today is getting coverage for time out of work.   She has applied for STD from her job- she is seeking a different position and was told to go though the STD process towards this goal Her HA is better today and her BP is ok.  However she does need a form completed for her job today to start the STD proceedings.  She really would like to do a non- phone job as this seems to trigger stress, headaches and worsening of her sx She took tramadol, hydrocodone (1/2 only) and both of her BP pills yesterday.    She uses tramadol and hydrocodone as needed for her lumbar stenosis.  She uses hydrocodone on more severe days  Last refills:Dr. Jeral Fruit is refilling her hydrocodone and gave her #90 on 1/11- he is refilling this for her about every 20 days I also did rx her tramadol on 04/20/15- #40  She is still a pretty heavy smoker but has cut  back to a pack a day currently   Patient Active Problem List   Diagnosis Date Noted  . Dehydration   . Hypokalemia 03/01/2015  . Hypomagnesemia 03/01/2015  . Nausea vomiting and diarrhea 03/01/2015  . Lumbar foraminal stenosis 02/09/2015  . MDD (major depressive disorder) (HCC) 10/06/2013  . Alcohol dependence (HCC) 05/26/2013  . GAD (generalized anxiety disorder) 05/26/2013  . Depressive disorder 05/26/2013  . Benzodiazepine dependence (HCC) 05/25/2013  . Alcohol dependence syndrome (HCC) 02/15/2013  . Suicide attempt by multiple drug overdose (HCC) 02/15/2013  . Substance induced mood disorder (HCC) 02/15/2013  . Nicotine dependence 02/15/2013  . COPD (chronic obstructive pulmonary disease) (HCC) 02/15/2013  . Hyponatremia 07/05/2012  . Nicotine addiction 09/02/2011  . Hypertension 07/12/2011  . 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 Hypertension   . Suicide attempt (HCC)     x 3  . Hx: UTI (urinary tract infection)   . COPD (chronic obstructive pulmonary disease) (HCC)   . Depression     pt. reports that she has not taken anything for depression in a couple of months. States she was told not to take Celexa & Seroquel together. Pt. is vague & unsure of her PCP wishes  as to what if any antidepressant she should be taking.   . Anxiety   . Neuromuscular disorder (HCC)     spinal stenosis- lumbar   . Migraine     "@ least a few times/yr" (03/01/2015)  . Osteoarthritis     right hip  . Arthritis     "back" (03/01/2015)    Past Surgical History  Procedure Laterality Date  . Orif clavicle fracture Left 01/03/2010    w//pinning 2nd metacarpal    . Lumbar laminectomy/decompression microdiscectomy N/A 02/09/2015    Procedure: Right Lumbar three- four, Lumbar four- five Laminectomy;  Surgeon: Hilda Lias, MD;  Location: [facility];  Service: Neurosurgery;  Laterality: N/A;  L3-4 L4-5 Laminectomy   . Back surgery    . Fracture surgery      Social History  Substance Use Topics  . Smoking status: Current Every Day Smoker -- 1.50 packs/day for 44 years    Types: Cigarettes  . Smokeless tobacco: Never Used  . Alcohol Use: 2.4 oz/week    4 Glasses of wine per week     Comment: was drinking beer daily now down to total 1.5 cans a week, quit hard liquor 1 month ago, as of  01/2015- pt. reports that its less than it use to be but states " I am not an alcoholic"    Family History  Problem Relation Age of Onset  . ADD / ADHD Neg Hx   . Anxiety disorder Neg Hx   . Alcohol abuse Neg Hx   . Bipolar disorder Neg Hx   . Dementia Neg Hx   . Depression Neg Hx   . Drug abuse Neg Hx   . OCD Neg Hx   . Schizophrenia Neg Hx     Allergies  Allergen Reactions  . Celexa [Citalopram Hydrobromide] Other (See Comments)    Hyponatremia thought due to SSRI    Medication list has been reviewed and updated.  Current Outpatient Prescriptions on File Prior to Visit  Medication Sig Dispense Refill  . albuterol (VENTOLIN HFA) 108 (90 Base) MCG/ACT inhaler INHALE 2 PUFFS INTO THE LUNGS EVERY 6 (SIX) HOURS AS NEEDED FOR WHEEZING. 54 Inhaler 1  . ALPRAZolam (XANAX) 1 MG tablet Take 1 tablet (1 mg total) by mouth 2 (two) times daily. May take an additional 0.5 mg once a day if needed for anxiety 230 tablet 1  . amLODipine (NORVASC) 5 MG tablet Take 1 tablet (5 mg total) by mouth daily. 30 tablet 11  . HYDROcodone-acetaminophen (NORCO) 10-325 MG tablet Take 1 tablet by mouth every 6 (six) hours as needed. 60 tablet 0  . lisinopril (PRINIVIL,ZESTRIL) 40 MG tablet TAKE 1 TABLET BY MOUTH EVERY DAY FOR BLOOD PRESSURE CONTROL (Patient taking differently: Take 40 mg by mouth daily before breakfast. ) 90 tablet 3  . OVER THE COUNTER MEDICATION OTC Bausch-Lomb eye drops taking prn    . traMADol (ULTRAM) 50 MG tablet TAKE 1 TABLET BY MOUTH EVERY 8 TO 12 HOURS AS NEEDED FOR PAIN 40 tablet 0   Current  Facility-Administered Medications on File Prior to Visit  Medication Dose Route Frequency Provider Last Rate Last Dose  . albuterol (PROVENTIL) (2.5 MG/3ML) 0.083% nebulizer solution 2.5 mg  2.5 mg Nebulization Once Jessica C Copland, MD      . ipratropium (ATROVENT) nebulizer solution 0.5 mg  0.5 mg Nebulization Once Pearline Cables, MD        Review of Systems:  As per HPI- otherwise negative.  Physical Examination: Filed Vitals:   08/03/15 1046  BP: 120/72  Pulse: 120  Temp: 98 F (36.7 C)  Resp: 17   Filed Vitals:   08/03/15 1046  Height:  (1.6 m)  Weight: 158 lb (71.668 kg)   Body mass index is 28 kg/(m^2). Ideal Body Weight: Weight in (lb) to have BMI = 25: 140.8  GEN: WDWN, NAD, Non-toxic, A & O x 3, looks well, sitting in dark room but does not mind my turning on the lights. Tobacco odor HEENT: Atraumatic, Normocephalic. Neck supple. No masses, No LAD.  Bilateral TM wnl, oropharynx normal.  PEERL,EOMI.   Ears and Nose: No external deformity. CV: RRR, No M/G/R. No JVD. No thrill. No extra heart sounds. PULM: CTA B, no wheezes, crackles, rhonchi. No retractions. No resp. distress. No accessory muscle use. ABD: S, NT, ND, +BS. No rebound. No HSM. EXTR: No c/c/e.   NEURO Normal gait. Normal strength, sensation and DTR of all extremities. Normal facial movement.  Negative romberg PSYCH: Normally interactive. Conversant. Not depressed or anxious appearing.  Calm demeanor.   Pulse Readings from Last 3 Encounters:  08/03/15 120  07/13/15 88  07/11/15 102   Noted tachycardia- she denies any CP or SOB.  Improved on recheck  Assessment and Plan: Hyponatremia - Plan: Basic metabolic panel  Migraine without aura and without status migrainosus, not intractable  Recheck her Na today Her HA is improved and not unusual for her- she will let me know if not continuing to improve Did her paperwork for her job Refilled her tramadol but advised her to continue to get  hydrocodone from Dr. Jeral Fruit She is reminded to use either pain pain according to her pain needs- not both concurrently  Encouraged tobacco cessation  Signed Abbe Amsterdam, MD

## 2015-08-03 NOTE — Patient Instructions (Signed)
It was good to see you today- I hope that your headache continues to improve Please get your hydrocodone from Dr. Jeral Fruit as he has been filling this for you over the last several months I did refill your tramadol today I will be in touch with your labs asap Let me know if your headache does not continue to ease  Good luck with continuing to cut down on your tobacco use!!

## 2015-08-04 ENCOUNTER — Encounter: Payer: Self-pay | Admitting: Family Medicine

## 2015-08-08 ENCOUNTER — Telehealth: Payer: Self-pay

## 2015-08-08 NOTE — Telephone Encounter (Signed)
Pt would like Dr Patsy Lager to know she left her a new message on [facility] and if she had any questions to call [phone]

## 2015-08-08 NOTE — Telephone Encounter (Signed)
[employer] faxed over an Attending Physician statement that needs to be completed by Dr. Patsy Lager. I have filled out what I can from the OV notes and highlighted the rest. Please fill out and return to the FMLA/Disability box at the 102 checkout desk with in 5-7 business days. I will place them in your box on 08/08/15

## 2015-08-09 ENCOUNTER — Telehealth: Payer: Self-pay

## 2015-08-09 NOTE — Telephone Encounter (Signed)
Paperwork scanned and faxed over on 08/09/15

## 2015-08-09 NOTE — Telephone Encounter (Signed)
Done 08/09/15

## 2015-08-09 NOTE — Telephone Encounter (Signed)
Pt states that it is urgent that dr copland return her call did not want to go into detail with me   Best number [phone]

## 2015-08-09 NOTE — Telephone Encounter (Signed)
FYI Dr. Copland 

## 2015-08-09 NOTE — Telephone Encounter (Signed)
Dr. Patsy Lager,  Patient has an urgent request and only wants to speak with you.

## 2015-08-09 NOTE — Telephone Encounter (Signed)
Called her back- she just needed her [employer] paperwork done which I did already

## 2015-08-10 ENCOUNTER — Encounter: Payer: Self-pay | Admitting: Family Medicine

## 2015-08-10 ENCOUNTER — Telehealth: Payer: Self-pay | Admitting: Family Medicine

## 2015-08-10 NOTE — Telephone Encounter (Signed)
Patient states that Monia Pouch needs a letter stating why she is out of work until 08/16/2015. The patient said that it is due to migraines and BP issues. Dr. Patsy Lager, can you write this letter and I will fax it along with her office notes?   [phone]

## 2015-09-06 ENCOUNTER — Other Ambulatory Visit: Payer: Self-pay | Admitting: Neurosurgery

## 2015-09-08 ENCOUNTER — Encounter (HOSPITAL_COMMUNITY)
Admission: RE | Admit: 2015-09-08 | Discharge: 2015-09-08 | Disposition: A | Discharge status: Home / Self Care | Payer: Managed Care, Other (non HMO) | Source: Ambulatory Visit | Attending: Neurosurgery | Admitting: Neurosurgery

## 2015-09-08 ENCOUNTER — Encounter (HOSPITAL_COMMUNITY): Payer: Self-pay

## 2015-09-08 DIAGNOSIS — Z0183 Encounter for blood typing: Secondary | ICD-10-CM | POA: Diagnosis not present

## 2015-09-08 DIAGNOSIS — M5416 Radiculopathy, lumbar region: Secondary | ICD-10-CM | POA: Insufficient documentation

## 2015-09-08 DIAGNOSIS — Z01812 Encounter for preprocedural laboratory examination: Secondary | ICD-10-CM | POA: Diagnosis present

## 2015-09-08 LAB — COMPREHENSIVE METABOLIC PANEL
ALBUMIN: 4 g/dL (ref 3.5–5.0)
ALK PHOS: 77 U/L (ref 38–126)
ALT: 33 U/L (ref 14–54)
AST: 45 U/L — ABNORMAL HIGH (ref 15–41)
Anion gap: 13 (ref 5–15)
BUN: 6 mg/dL (ref 6–20)
CALCIUM: 9.6 mg/dL (ref 8.9–10.3)
CO2: 22 mmol/L (ref 22–32)
CREATININE: 0.76 mg/dL (ref 0.44–1.00)
Chloride: 94 mmol/L — ABNORMAL LOW (ref 101–111)
GFR calc non Af Amer: >60 mL/min (ref >60)
GLUCOSE: 95 mg/dL (ref 65–99)
Potassium: 4.2 mmol/L (ref 3.5–5.1)
SODIUM: 129 mmol/L — AB (ref 135–145)
Total Bilirubin: 1 mg/dL (ref 0.3–1.2)
Total Protein: 7.3 g/dL (ref 6.5–8.1)

## 2015-09-08 LAB — CBC
HEMATOCRIT: 39.8 % (ref 36.0–46.0)
HEMOGLOBIN: 13.5 g/dL (ref 12.0–15.0)
MCH: 34.4 pg — AB (ref 26.0–34.0)
MCHC: 33.9 g/dL (ref 30.0–36.0)
MCV: 101.3 fL — ABNORMAL HIGH (ref 78.0–100.0)
Platelets: 123 10*3/uL — ABNORMAL LOW (ref 150–400)
RBC: 3.93 MIL/uL (ref 3.87–5.11)
RDW: 14.3 % (ref 11.5–15.5)
WBC: 6.6 10*3/uL (ref 4.0–10.5)

## 2015-09-08 LAB — SURGICAL PCR SCREEN
MRSA, PCR: NEGATIVE
Staphylococcus aureus: POSITIVE — AB

## 2015-09-08 LAB — TYPE AND SCREEN
ABO/RH(D): O POS
ANTIBODY SCREEN: NEGATIVE

## 2015-09-08 LAB — ABO/RH: ABO/RH(D): O POS

## 2015-09-08 NOTE — Progress Notes (Signed)
Mupirocin Ointment Rx called into [facility] on [address] for positive PCR of Staph. Pt notified and voiced understanding. 

## 2015-09-08 NOTE — Pre-Procedure Instructions (Signed)
Shereka L Mauch  09/08/2015      [id number]- [place], [place] - [address] [place] [id number] Phone: [phone] Fax: [phone]  [facility] [place] - [address] [place] [id number] Phone: [phone] Fax: [phone]    Your procedure is scheduled on Tuesday March 21st .  [facility].M.  Call this number if you have problems the morning of surgery:  [phone]   Remember:  Do not eat food or drink liquids after midnight.  Take these medicines the morning of surgery with A SIP OF WATER alprazolam (xanax) if needed, amlodipine (norvasc), hydrocodone-acetaminophen (norco) if needed, eye drops if needed, inhaler if needed (bring with you)  STOP: ALL Vitamins, Supplements, Effient and Herbal Medications, Fish Oils, Aspirins, NSAIDs (Nonsteroidal Anti-inflammatories such as Ibuprofen, Aleve, or Advil), and Goody's/BC Powders 7 days prior to surgery, until after surgery as directed by your physician.    Do not wear jewelry, make-up or nail polish.  Do not wear lotions, powders, or perfumes.  You may wear deodorant.  Do not shave 48 hours prior to surgery.  Men may shave face and neck.  Do not bring valuables to the hospital.  [facility] is not responsible for any belongings or valuables.  Contacts, dentures or bridgework may not be worn into surgery.  Leave your suitcase in the car.  After surgery it may be brought to your room.  For patients admitted to the hospital, discharge time will be determined by your treatment team.  Patients discharged the day of surgery will not be allowed to drive home.        Preparing for Surgery at [facility]  Before surgery, you can play an important role.  Because skin is not sterile, your skin needs to be as free of germs as possible.  You can reduce the number of germs on your skin by washing with CHG  (chlorahexidine gluconate) Soap before surgery.  CHG is an antiseptic cleaner with kills germs and bonds with the skin to continue killing germs even after washing.   Please do not use if you have an allergy to CHG or antibacterial soaps.  If your skin becomes reddened/irritated stop using the CHG.  Do not shave (including legs and underarms) for at least 48 hours prior to first CHG shower.  It is okay to shave your face.  Please follow these instructions carefully:  1. Shower with CHG Soap the night before surgery and the morning of Surgery. 2. If you choose to wash your hair, wash your hair first as usual with your normal shampoo. 3. After you shampoo, rinse your hair and body thoroughly to remove the Shampoo. 4. Use CHG as you would any other liquid soap. You can apply chg directly to the skin and wash gently with scrungie or a clean washcloth. 5. Apply the CHG Soap to your body ONLY FROM THE NECK DOWN. Do not use on open wounds or open sores. Avoid contact with your eyes, ears, mouth and genitals (private parts). Wash genitals (private parts) with your normal soap. 6. Wash thoroughly, paying special attention to the area where your surgery will be performed. 7. Thoroughly rinse your body with warm water from the neck down. 8. DO NOT shower/wash with your normal soap after using and rinsing off the CHG Soap. 9. Pat yourself dry with a clean towel.  10. Wear clean pajamas.  11. Place clean sheets on your bed the night of your first shower and do not sleep with pets.  Day of Surgery  Do not apply any lotions/deodorants the morning of surgery. Please wear clean clothes to the hospital/surgery center.   Please read over the following fact sheets that you were given. Pain Booklet, Coughing and Deep Breathing, Blood Transfusion Information, MRSA  Information and Surgical Site Infection Prevention

## 2015-09-12 MED ORDER — CEFAZOLIN SODIUM-DEXTROSE 2-3 GM-% IV SOLR
2.0000 g | INTRAVENOUS | Status: AC
  Administered 2015-09-13 (×2): 2 g via INTRAVENOUS
  Filled 2015-09-12: qty 50

## 2015-09-12 NOTE — H&P (Signed)
Brenda Rice is an 60 y.o. female.   Chief Complaint: lumbar pain with extension to both legs. HPI: patient who underwent right laminotomies for lumbar radicular pain. Did well but now she came back with worsening of her back pain with radiation to both legs , no better with conservative treatmen. Also mild weakness of the thenar muscles  Past Medical History  Diagnosis Date  . Hypertension   . Suicide attempt (HCC)     x 3  . Hx: UTI (urinary tract infection)   . COPD (chronic obstructive pulmonary disease) (HCC)   . Depression     pt. reports that she has not taken anything for depression in a couple of months. States she was told not to take Celexa & Seroquel together. Pt. is vague & unsure of her PCP wishes as to what if any antidepressant she should be taking.   . Anxiety   . Neuromuscular disorder (HCC)     spinal stenosis- lumbar   . Migraine     "@ least a few times/yr" (03/01/2015)  . Osteoarthritis     right hip  . Arthritis     "back" (03/01/2015)    Past Surgical History  Procedure Laterality Date  . Orif clavicle fracture Left 01/03/2010    w//pinning 2nd metacarpal    . Lumbar laminectomy/decompression microdiscectomy N/A 02/09/2015    Procedure: Right Lumbar three- four, Lumbar four- five Laminectomy;  Surgeon: Hilda LiasErnesto Ionia Schey, MD;  Location: [facility];  Service: Neurosurgery;  Laterality: N/A;  L3-4 L4-5 Laminectomy  . Back surgery    . Fracture surgery      Family History  Problem Relation Age of Onset  . ADD / ADHD Neg Hx   . Anxiety disorder Neg Hx   . Alcohol abuse Neg Hx   . Bipolar disorder Neg Hx   . Dementia Neg Hx   . Depression Neg Hx   . Drug abuse Neg Hx   . OCD Neg Hx   . Schizophrenia Neg Hx    Social History:  reports that she has been smoking Cigarettes.  She has a 66 pack-year smoking history. She has never used smokeless tobacco. She reports that she drinks about 2.4 oz of alcohol per week. She reports that she does not use illicit  drugs.  Allergies:  Allergies  Allergen Reactions  . Celexa [Citalopram Hydrobromide] Other (See Comments)    Hyponatremia thought due to SSRI    No prescriptions prior to admission    No results found for this or any previous visit (from the past 48 hour(s)). No results found.  Review of Systems  Constitutional: Negative.   HENT: Negative.   Eyes: Negative.   Respiratory: Negative.   Cardiovascular: Negative.   Gastrointestinal: Negative.   Genitourinary: Negative.   Musculoskeletal: Positive for back pain.  Skin: Negative.   Neurological: Positive for sensory change and focal weakness.  Endo/Heme/Allergies: Negative.   Psychiatric/Behavioral: Negative.     There were no vitals taken for this visit. Physical Exam  Hent, nl. Neck, nl. Cv, nl. Lungs,clear. Abdomen,nl. Extremities mild weakness of thenar muscles. NEURO  slr positive at 45 degrees. Pain with sitting and better with flexion. Walking increases the pain. Myelogram showed DDD at l3-4,,4-5 with stenosis at those levels. Assessment/Plan Patient for decompression and fusion at l34,45. She and her husband are aware of risks and benefits  Brenda Rice,Brenda Keefe M, MD 09/12/2015, 5:40 PM

## 2015-09-13 ENCOUNTER — Encounter (HOSPITAL_COMMUNITY)
Admission: RE | Disposition: A | Discharge status: Home / Self Care | Payer: Self-pay | Source: Ambulatory Visit | Attending: Neurosurgery

## 2015-09-13 ENCOUNTER — Inpatient Hospital Stay (HOSPITAL_COMMUNITY): Payer: Managed Care, Other (non HMO)

## 2015-09-13 ENCOUNTER — Inpatient Hospital Stay (HOSPITAL_COMMUNITY)
Admission: RE | Admit: 2015-09-13 | Discharge: 2015-09-17 | DRG: 458 | Disposition: A | Discharge status: Home / Self Care | Payer: Managed Care, Other (non HMO) | Source: Ambulatory Visit | Attending: Neurosurgery | Admitting: Neurosurgery

## 2015-09-13 ENCOUNTER — Inpatient Hospital Stay (HOSPITAL_COMMUNITY): Payer: Managed Care, Other (non HMO) | Admitting: Anesthesiology

## 2015-09-13 ENCOUNTER — Encounter (HOSPITAL_COMMUNITY): Payer: Self-pay | Admitting: *Deleted

## 2015-09-13 ENCOUNTER — Other Ambulatory Visit: Payer: Self-pay

## 2015-09-13 DIAGNOSIS — M51369 Other intervertebral disc degeneration, lumbar region without mention of lumbar back pain or lower extremity pain: Secondary | ICD-10-CM | POA: Diagnosis present

## 2015-09-13 DIAGNOSIS — M4316 Spondylolisthesis, lumbar region: Secondary | ICD-10-CM | POA: Diagnosis present

## 2015-09-13 DIAGNOSIS — R062 Wheezing: Secondary | ICD-10-CM

## 2015-09-13 DIAGNOSIS — Z419 Encounter for procedure for purposes other than remedying health state, unspecified: Secondary | ICD-10-CM

## 2015-09-13 DIAGNOSIS — R0602 Shortness of breath: Secondary | ICD-10-CM

## 2015-09-13 DIAGNOSIS — F17210 Nicotine dependence, cigarettes, uncomplicated: Secondary | ICD-10-CM | POA: Diagnosis present

## 2015-09-13 DIAGNOSIS — I10 Essential (primary) hypertension: Secondary | ICD-10-CM | POA: Diagnosis present

## 2015-09-13 DIAGNOSIS — R0989 Other specified symptoms and signs involving the circulatory and respiratory systems: Secondary | ICD-10-CM

## 2015-09-13 DIAGNOSIS — M5136 Other intervertebral disc degeneration, lumbar region: Secondary | ICD-10-CM | POA: Diagnosis present

## 2015-09-13 DIAGNOSIS — F329 Major depressive disorder, single episode, unspecified: Secondary | ICD-10-CM | POA: Diagnosis present

## 2015-09-13 DIAGNOSIS — M5116 Intervertebral disc disorders with radiculopathy, lumbar region: Secondary | ICD-10-CM | POA: Diagnosis present

## 2015-09-13 DIAGNOSIS — M419 Scoliosis, unspecified: Secondary | ICD-10-CM | POA: Diagnosis present

## 2015-09-13 DIAGNOSIS — M545 Low back pain: Secondary | ICD-10-CM | POA: Diagnosis present

## 2015-09-13 DIAGNOSIS — F419 Anxiety disorder, unspecified: Secondary | ICD-10-CM | POA: Diagnosis present

## 2015-09-13 SURGERY — POSTERIOR LUMBAR FUSION 2 LEVEL
Anesthesia: General

## 2015-09-13 MED ORDER — ONDANSETRON HCL 4 MG/2ML IJ SOLN
4.0000 mg | INTRAMUSCULAR | Status: DC | PRN
  Administered 2015-09-15 (×2): 4 mg via INTRAVENOUS
  Filled 2015-09-13 (×2): qty 2

## 2015-09-13 MED ORDER — ARTIFICIAL TEARS OP OINT
TOPICAL_OINTMENT | OPHTHALMIC | Status: AC
  Filled 2015-09-13: qty 3.5

## 2015-09-13 MED ORDER — SUGAMMADEX SODIUM 200 MG/2ML IV SOLN
INTRAVENOUS | Status: AC
  Filled 2015-09-13: qty 2

## 2015-09-13 MED ORDER — MORPHINE SULFATE 2 MG/ML IV SOLN
INTRAVENOUS | Status: DC
  Administered 2015-09-13: 21:00:00 via INTRAVENOUS
  Administered 2015-09-14: 6 mg via INTRAVENOUS
  Administered 2015-09-14: 15 mg via INTRAVENOUS
  Filled 2015-09-13: qty 25

## 2015-09-13 MED ORDER — OXYCODONE HCL 5 MG/5ML PO SOLN: 5.0000 mg | Freq: Once | ORAL | Status: DC | PRN

## 2015-09-13 MED ORDER — MIDAZOLAM HCL 2 MG/2ML IJ SOLN
INTRAMUSCULAR | Status: AC
  Filled 2015-09-13: qty 2

## 2015-09-13 MED ORDER — LIDOCAINE HCL (CARDIAC) 20 MG/ML IV SOLN
INTRAVENOUS | Status: AC
  Filled 2015-09-13: qty 5

## 2015-09-13 MED ORDER — LISINOPRIL 40 MG PO TABS
40.0000 mg | ORAL_TABLET | Freq: Every day | ORAL | Status: DC
  Administered 2015-09-16: 40 mg via ORAL
  Filled 2015-09-13 (×2): qty 1
  Filled 2015-09-13: qty 2
  Filled 2015-09-13: qty 1
  Filled 2015-09-13 (×2): qty 2
  Filled 2015-09-13: qty 1

## 2015-09-13 MED ORDER — OXYCODONE-ACETAMINOPHEN 5-325 MG PO TABS
1.0000 | ORAL_TABLET | ORAL | Status: DC | PRN
  Administered 2015-09-13 – 2015-09-17 (×15): 2 via ORAL
  Filled 2015-09-13 (×15): qty 2

## 2015-09-13 MED ORDER — BUPIVACAINE LIPOSOME 1.3 % IJ SUSP
20.0000 mL | INTRAMUSCULAR | Status: AC
  Filled 2015-09-13: qty 20

## 2015-09-13 MED ORDER — ALBUTEROL SULFATE HFA 108 (90 BASE) MCG/ACT IN AERS
INHALATION_SPRAY | RESPIRATORY_TRACT | Status: AC
  Filled 2015-09-13: qty 6.7

## 2015-09-13 MED ORDER — SODIUM CHLORIDE 0.9% FLUSH
3.0000 mL | Freq: Two times a day (BID) | INTRAVENOUS | Status: DC
  Administered 2015-09-14 – 2015-09-16 (×3): 3 mL via INTRAVENOUS

## 2015-09-13 MED ORDER — AMLODIPINE BESYLATE 5 MG PO TABS
5.0000 mg | ORAL_TABLET | Freq: Every day | ORAL | Status: DC
  Administered 2015-09-16: 5 mg via ORAL
  Filled 2015-09-13 (×3): qty 1

## 2015-09-13 MED ORDER — ONDANSETRON HCL 4 MG/2ML IJ SOLN
INTRAMUSCULAR | Status: AC
  Filled 2015-09-13: qty 2

## 2015-09-13 MED ORDER — ALBUTEROL SULFATE (2.5 MG/3ML) 0.083% IN NEBU
2.5000 mg | INHALATION_SOLUTION | Freq: Once | RESPIRATORY_TRACT | Status: DC
  Filled 2015-09-13: qty 3

## 2015-09-13 MED ORDER — FENTANYL CITRATE (PF) 250 MCG/5ML IJ SOLN
INTRAMUSCULAR | Status: AC
  Filled 2015-09-13: qty 5

## 2015-09-13 MED ORDER — ONDANSETRON HCL 4 MG/2ML IJ SOLN: 4.0000 mg | Freq: Four times a day (QID) | INTRAMUSCULAR | Status: DC | PRN

## 2015-09-13 MED ORDER — AMLODIPINE BESYLATE 5 MG PO TABS: 5.0000 mg | ORAL_TABLET | Freq: Every day | ORAL | Status: AC

## 2015-09-13 MED ORDER — SODIUM CHLORIDE 0.9% FLUSH: 3.0000 mL | INTRAVENOUS | Status: DC | PRN

## 2015-09-13 MED ORDER — PROMETHAZINE HCL 25 MG/ML IJ SOLN
6.2500 mg | INTRAMUSCULAR | Status: DC | PRN
  Administered 2015-09-13: 6.25 mg via INTRAVENOUS

## 2015-09-13 MED ORDER — ACETAMINOPHEN 650 MG RE SUPP: 650.0000 mg | RECTAL | Status: DC | PRN

## 2015-09-13 MED ORDER — VANCOMYCIN HCL 1000 MG IV SOLR
INTRAVENOUS | Status: DC | PRN
  Administered 2015-09-13: 1000 mg

## 2015-09-13 MED ORDER — SUGAMMADEX SODIUM 200 MG/2ML IV SOLN
INTRAVENOUS | Status: DC | PRN
  Administered 2015-09-13: 200 mg via INTRAVENOUS

## 2015-09-13 MED ORDER — CEFAZOLIN SODIUM 1-5 GM-% IV SOLN
1.0000 g | Freq: Three times a day (TID) | INTRAVENOUS | Status: AC
  Administered 2015-09-13 – 2015-09-14 (×2): 1 g via INTRAVENOUS
  Filled 2015-09-13 (×2): qty 50

## 2015-09-13 MED ORDER — 0.9 % SODIUM CHLORIDE (POUR BTL) OPTIME
TOPICAL | Status: DC | PRN
  Administered 2015-09-13: 1000 mL

## 2015-09-13 MED ORDER — ROCURONIUM BROMIDE 50 MG/5ML IV SOLN
INTRAVENOUS | Status: AC
  Filled 2015-09-13: qty 1

## 2015-09-13 MED ORDER — CEFAZOLIN SODIUM 1 G IJ SOLR
INTRAMUSCULAR | Status: AC
  Filled 2015-09-13: qty 20

## 2015-09-13 MED ORDER — PHENYLEPHRINE HCL 10 MG/ML IJ SOLN
INTRAMUSCULAR | Status: DC | PRN
  Administered 2015-09-13 (×6): 80 ug via INTRAVENOUS

## 2015-09-13 MED ORDER — LORAZEPAM 2 MG/ML IJ SOLN: 1.0000 mg | Freq: Once | INTRAMUSCULAR | Status: DC | PRN

## 2015-09-13 MED ORDER — DIPHENHYDRAMINE HCL 50 MG/ML IJ SOLN: 12.5000 mg | Freq: Four times a day (QID) | INTRAMUSCULAR | Status: DC | PRN

## 2015-09-13 MED ORDER — PROPOFOL 10 MG/ML IV BOLUS
INTRAVENOUS | Status: AC
  Filled 2015-09-13: qty 20

## 2015-09-13 MED ORDER — ACETAMINOPHEN 325 MG PO TABS: 650.0000 mg | ORAL_TABLET | ORAL | Status: DC | PRN

## 2015-09-13 MED ORDER — ALBUMIN HUMAN 5 % IV SOLN
INTRAVENOUS | Status: DC | PRN
  Administered 2015-09-13: 15:00:00 via INTRAVENOUS

## 2015-09-13 MED ORDER — DOCUSATE SODIUM 100 MG PO CAPS
100.0000 mg | ORAL_CAPSULE | Freq: Two times a day (BID) | ORAL | Status: DC
  Administered 2015-09-13 – 2015-09-16 (×6): 100 mg via ORAL
  Filled 2015-09-13 (×7): qty 1

## 2015-09-13 MED ORDER — SODIUM CHLORIDE 0.9% FLUSH: 9.0000 mL | INTRAVENOUS | Status: DC | PRN

## 2015-09-13 MED ORDER — VANCOMYCIN HCL 1000 MG IV SOLR
INTRAVENOUS | Status: AC
Start: 2015-09-13 — End: 2015-09-13
  Filled 2015-09-13: qty 1000

## 2015-09-13 MED ORDER — LACTATED RINGERS IV SOLN
INTRAVENOUS | Status: DC | PRN
  Administered 2015-09-13 (×3): via INTRAVENOUS

## 2015-09-13 MED ORDER — ALBUTEROL SULFATE HFA 108 (90 BASE) MCG/ACT IN AERS
INHALATION_SPRAY | RESPIRATORY_TRACT | Status: DC | PRN
  Administered 2015-09-13: 6 via RESPIRATORY_TRACT

## 2015-09-13 MED ORDER — SODIUM CHLORIDE 0.9 % IV SOLN: 250.0000 mL | INTRAVENOUS | Status: DC

## 2015-09-13 MED ORDER — ONDANSETRON HCL 4 MG/2ML IJ SOLN
INTRAMUSCULAR | Status: DC | PRN
  Administered 2015-09-13: 4 mg via INTRAVENOUS

## 2015-09-13 MED ORDER — LIDOCAINE HCL 4 % EX SOLN
CUTANEOUS | Status: DC | PRN
  Administered 2015-09-13: 2 mL via TOPICAL

## 2015-09-13 MED ORDER — MENTHOL 3 MG MT LOZG: 1.0000 | LOZENGE | OROMUCOSAL | Status: DC | PRN

## 2015-09-13 MED ORDER — LACTATED RINGERS IV SOLN
INTRAVENOUS | Status: DC
  Administered 2015-09-13: 10:00:00 via INTRAVENOUS

## 2015-09-13 MED ORDER — SODIUM CHLORIDE 0.9 % IV SOLN: INTRAVENOUS | Status: DC

## 2015-09-13 MED ORDER — HYDROMORPHONE HCL 1 MG/ML IJ SOLN
INTRAMUSCULAR | Status: AC
  Administered 2015-09-13: 0.5 mg via INTRAVENOUS
  Filled 2015-09-13: qty 1

## 2015-09-13 MED ORDER — SURGIFOAM 100 EX MISC
CUTANEOUS | Status: DC | PRN
  Administered 2015-09-13: 11:00:00 via TOPICAL

## 2015-09-13 MED ORDER — PHENYLEPHRINE HCL 10 MG/ML IJ SOLN
INTRAMUSCULAR | Status: AC
  Filled 2015-09-13: qty 2

## 2015-09-13 MED ORDER — BUPIVACAINE LIPOSOME 1.3 % IJ SUSP
INTRAMUSCULAR | Status: DC | PRN
  Administered 2015-09-13: 20 mL

## 2015-09-13 MED ORDER — PHENOL 1.4 % MT LIQD: 1.0000 | OROMUCOSAL | Status: DC | PRN

## 2015-09-13 MED ORDER — DIPHENHYDRAMINE HCL 12.5 MG/5ML PO ELIX: 12.5000 mg | ORAL_SOLUTION | Freq: Four times a day (QID) | ORAL | Status: DC | PRN

## 2015-09-13 MED ORDER — FENTANYL CITRATE (PF) 100 MCG/2ML IJ SOLN
INTRAMUSCULAR | Status: DC | PRN
  Administered 2015-09-13: 50 ug via INTRAVENOUS
  Administered 2015-09-13: 150 ug via INTRAVENOUS
  Administered 2015-09-13 (×6): 50 ug via INTRAVENOUS
  Administered 2015-09-13: 100 ug via INTRAVENOUS

## 2015-09-13 MED ORDER — OXYCODONE HCL 5 MG PO TABS: 5.0000 mg | ORAL_TABLET | Freq: Once | ORAL | Status: DC | PRN

## 2015-09-13 MED ORDER — GLYCOPYRROLATE 0.2 MG/ML IJ SOLN
INTRAMUSCULAR | Status: AC
  Filled 2015-09-13: qty 2

## 2015-09-13 MED ORDER — HYDROMORPHONE HCL 1 MG/ML IJ SOLN
0.2500 mg | INTRAMUSCULAR | Status: DC | PRN
  Administered 2015-09-13 (×2): 0.5 mg via INTRAVENOUS

## 2015-09-13 MED ORDER — ROCURONIUM BROMIDE 100 MG/10ML IV SOLN
INTRAVENOUS | Status: DC | PRN
  Administered 2015-09-13: 50 mg via INTRAVENOUS
  Administered 2015-09-13: 20 mg via INTRAVENOUS
  Administered 2015-09-13 (×2): 10 mg via INTRAVENOUS

## 2015-09-13 MED ORDER — ARTIFICIAL TEARS OP OINT
TOPICAL_OINTMENT | OPHTHALMIC | Status: DC | PRN
  Administered 2015-09-13: 1 via OPHTHALMIC

## 2015-09-13 MED ORDER — PROPOFOL 10 MG/ML IV BOLUS
INTRAVENOUS | Status: DC | PRN
  Administered 2015-09-13: 150 mg via INTRAVENOUS

## 2015-09-13 MED ORDER — DIAZEPAM 5 MG PO TABS
5.0000 mg | ORAL_TABLET | Freq: Four times a day (QID) | ORAL | Status: DC | PRN
  Administered 2015-09-15 – 2015-09-17 (×5): 5 mg via ORAL
  Filled 2015-09-13 (×5): qty 1

## 2015-09-13 MED ORDER — GLYCOPYRROLATE 0.2 MG/ML IJ SOLN
INTRAMUSCULAR | Status: DC | PRN
  Administered 2015-09-13: .2 mg via INTRAVENOUS

## 2015-09-13 MED ORDER — PROMETHAZINE HCL 25 MG/ML IJ SOLN
INTRAMUSCULAR | Status: AC
  Administered 2015-09-13: 6.25 mg via INTRAVENOUS
  Filled 2015-09-13: qty 1

## 2015-09-13 MED ORDER — MIDAZOLAM HCL 5 MG/5ML IJ SOLN
INTRAMUSCULAR | Status: DC | PRN
  Administered 2015-09-13: 2 mg via INTRAVENOUS

## 2015-09-13 MED ORDER — LIDOCAINE HCL (CARDIAC) 20 MG/ML IV SOLN
INTRAVENOUS | Status: AC
Start: 2015-09-13 — End: 2015-09-13
  Filled 2015-09-13: qty 5

## 2015-09-13 MED ORDER — NALOXONE HCL 0.4 MG/ML IJ SOLN: 0.4000 mg | INTRAMUSCULAR | Status: DC | PRN

## 2015-09-13 MED ORDER — ZOLPIDEM TARTRATE 5 MG PO TABS
5.0000 mg | ORAL_TABLET | Freq: Every evening | ORAL | Status: DC | PRN
  Administered 2015-09-14: 5 mg via ORAL
  Filled 2015-09-13: qty 1

## 2015-09-13 MED ORDER — IPRATROPIUM BROMIDE 0.02 % IN SOLN
0.5000 mg | Freq: Once | RESPIRATORY_TRACT | Status: DC
  Filled 2015-09-13: qty 2.5

## 2015-09-13 MED ORDER — PHENYLEPHRINE HCL 10 MG/ML IJ SOLN
10.0000 mg | INTRAMUSCULAR | Status: DC | PRN
  Administered 2015-09-13: 10 ug/min via INTRAVENOUS

## 2015-09-13 MED ORDER — LIDOCAINE HCL (CARDIAC) 20 MG/ML IV SOLN
INTRAVENOUS | Status: DC | PRN
  Administered 2015-09-13: 100 mg via INTRAVENOUS

## 2015-09-13 SURGICAL SUPPLY — 79 items
APL SKNCLS STERI-STRIP NONHPOA (GAUZE/BANDAGES/DRESSINGS) ×1
BENZOIN TINCTURE PRP APPL 2/3 (GAUZE/BANDAGES/DRESSINGS) ×3 IMPLANT
BLADE CLIPPER SURG (BLADE) IMPLANT
BUR ACORN 6.0 (BURR) ×2 IMPLANT
BUR ACORN 6.0MM (BURR) ×1
BUR MATCHSTICK NEURO 3.0 LAGG (BURR) ×3 IMPLANT
CANISTER SUCT 3000ML PPV (MISCELLANEOUS) ×3 IMPLANT
CAP LOCKING THREADED (Cap) ×12 IMPLANT
CLOSURE WOUND 1/2 X4 (GAUZE/BANDAGES/DRESSINGS) ×1
CONT SPEC 4OZ CLIKSEAL STRL BL (MISCELLANEOUS) ×3 IMPLANT
COVER BACK TABLE 60X90IN (DRAPES) ×3 IMPLANT
CROSSLINK SPINAL FUSION (Cage) ×2 IMPLANT
DRAPE C-ARM 42X72 X-RAY (DRAPES) ×6 IMPLANT
DRAPE LAPAROTOMY 100X72X124 (DRAPES) ×3 IMPLANT
DRAPE MICROSCOPE LEICA (MISCELLANEOUS) ×2 IMPLANT
DRAPE POUCH INSTRU U-SHP 10X18 (DRAPES) ×3 IMPLANT
DRSG OPSITE POSTOP 4X8 (GAUZE/BANDAGES/DRESSINGS) ×2 IMPLANT
DRSG PAD ABDOMINAL 8X10 ST (GAUZE/BANDAGES/DRESSINGS) IMPLANT
DURAPREP 26ML APPLICATOR (WOUND CARE) ×3 IMPLANT
ELECT BLADE 4.0 EZ CLEAN MEGAD (MISCELLANEOUS) ×3
ELECT REM PT RETURN 9FT ADLT (ELECTROSURGICAL) ×3
ELECTRODE BLDE 4.0 EZ CLN MEGD (MISCELLANEOUS) IMPLANT
ELECTRODE REM PT RTRN 9FT ADLT (ELECTROSURGICAL) ×1 IMPLANT
EVACUATOR 1/8 PVC DRAIN (DRAIN) IMPLANT
EVACUATOR 3/16  PVC DRAIN (DRAIN) ×2
EVACUATOR 3/16 PVC DRAIN (DRAIN) IMPLANT
GAUZE SPONGE 4X4 12PLY STRL (GAUZE/BANDAGES/DRESSINGS) ×3 IMPLANT
GAUZE SPONGE 4X4 16PLY XRAY LF (GAUZE/BANDAGES/DRESSINGS) ×3 IMPLANT
GLOVE BIO SURGEON STRL SZ7 (GLOVE) ×2 IMPLANT
GLOVE BIOGEL M 8.0 STRL (GLOVE) ×3 IMPLANT
GLOVE ECLIPSE 7.0 STRL STRAW (GLOVE) ×4 IMPLANT
GLOVE EXAM NITRILE LRG STRL (GLOVE) IMPLANT
GLOVE EXAM NITRILE MD LF STRL (GLOVE) IMPLANT
GLOVE EXAM NITRILE XL STR (GLOVE) IMPLANT
GLOVE EXAM NITRILE XS STR PU (GLOVE) IMPLANT
GLOVE INDICATOR 7.0 STRL GRN (GLOVE) ×8 IMPLANT
GLOVE INDICATOR 7.5 STRL GRN (GLOVE) ×8 IMPLANT
GLOVE SS N UNI LF 6.5 STRL (GLOVE) ×4 IMPLANT
GOWN STRL REUS W/ TWL LRG LVL3 (GOWN DISPOSABLE) ×1 IMPLANT
GOWN STRL REUS W/ TWL XL LVL3 (GOWN DISPOSABLE) IMPLANT
GOWN STRL REUS W/TWL 2XL LVL3 (GOWN DISPOSABLE) IMPLANT
GOWN STRL REUS W/TWL LRG LVL3 (GOWN DISPOSABLE) ×15
GOWN STRL REUS W/TWL XL LVL3 (GOWN DISPOSABLE) ×3
IMPLANT RISE 11X17-8MM SPINE (Neuro Prosthesis/Implant) ×2 IMPLANT
KIT BASIN OR (CUSTOM PROCEDURE TRAY) ×3 IMPLANT
KIT INFUSE MEDIUM (Orthopedic Implant) ×2 IMPLANT
KIT ROOM TURNOVER OR (KITS) ×3 IMPLANT
MILL MEDIUM DISP (BLADE) ×2 IMPLANT
NDL HYPO 18GX1.5 BLUNT FILL (NEEDLE) IMPLANT
NDL HYPO 21X1.5 SAFETY (NEEDLE) IMPLANT
NDL HYPO 25X1 1.5 SAFETY (NEEDLE) IMPLANT
NEEDLE HYPO 18GX1.5 BLUNT FILL (NEEDLE) IMPLANT
NEEDLE HYPO 21X1.5 SAFETY (NEEDLE) IMPLANT
NEEDLE HYPO 25X1 1.5 SAFETY (NEEDLE) IMPLANT
NS IRRIG 1000ML POUR BTL (IV SOLUTION) ×3 IMPLANT
PACK FOAM VITOSS 10CC (Orthopedic Implant) ×2 IMPLANT
PACK LAMINECTOMY NEURO (CUSTOM PROCEDURE TRAY) ×3 IMPLANT
PAD ARMBOARD 7.5X6 YLW CONV (MISCELLANEOUS) ×9 IMPLANT
PATTIES SURGICAL .5 X1 (DISPOSABLE) ×3 IMPLANT
PATTIES SURGICAL .5 X3 (DISPOSABLE) IMPLANT
ROD 70MM SPINAL (Rod) ×4 IMPLANT
RUBBERBAND STERILE (MISCELLANEOUS) ×4 IMPLANT
SCREW CREO THREADED 5.5X45MM (Screw) ×12 IMPLANT
SPACER RISE 8X22 8-14MM-10 (Neuro Prosthesis/Implant) ×2 IMPLANT
SPONGE LAP 4X18 X RAY DECT (DISPOSABLE) IMPLANT
SPONGE NEURO XRAY DETECT 1X3 (DISPOSABLE) IMPLANT
SPONGE SURGIFOAM ABS GEL 100 (HEMOSTASIS) ×3 IMPLANT
STRIP CLOSURE SKIN 1/2X4 (GAUZE/BANDAGES/DRESSINGS) ×2 IMPLANT
SUT PROLENE 6 0 BV (SUTURE) ×2 IMPLANT
SUT VIC AB 1 CT1 18XBRD ANBCTR (SUTURE) ×2 IMPLANT
SUT VIC AB 1 CT1 8-18 (SUTURE) ×6
SUT VIC AB 2-0 CP2 18 (SUTURE) ×3 IMPLANT
SUT VIC AB 3-0 SH 8-18 (SUTURE) ×3 IMPLANT
SYR 20CC LL (SYRINGE) ×4 IMPLANT
SYR 5ML LL (SYRINGE) IMPLANT
TOWEL OR 17X24 6PK STRL BLUE (TOWEL DISPOSABLE) ×3 IMPLANT
TOWEL OR 17X26 10 PK STRL BLUE (TOWEL DISPOSABLE) ×3 IMPLANT
TRAY FOLEY W/METER SILVER 14FR (SET/KITS/TRAYS/PACK) ×3 IMPLANT
WATER STERILE IRR 1000ML POUR (IV SOLUTION) ×3 IMPLANT

## 2015-09-13 NOTE — Op Note (Signed)
Brenda Rice, MITTER NO.:  [id number]  MEDICAL RECORD NO.:  [id number]  LOCATION:  5M12C                        FACILITY:  [facility]  PHYSICIAN:  Hilda Lias, M.D.   DATE OF BIRTH:  [birth year]  DATE OF PROCEDURE:  09/13/2015 DATE OF DISCHARGE:                              OPERATIVE REPORT   PREOPERATIVE DIAGNOSIS:  L3-L4, L4-5 spondylolisthesis with degenerative disk disease with chronic radiculopathy, scoliosis.  Status post decompression on the right side, L3-4, L4-5.  POSTOPERATIVE DIAGNOSIS:  L3-L4, L4-5 spondylolisthesis with degenerative disk disease with chronic radiculopathy, scoliosis.  Status post decompression on the right side, L3-4, L4-5.  PROCEDURE:  Bilateral L3 and L4 laminectomy and bilateral facetectomy, lysis of adhesion to decompress the L3, L4, L5 nerve root.  Microscope. Left side L3-4 and L4-5 diskectomy to be able to introduce a single cage expandable with BMP and autograft.  Pedicle screws L3, L4, L5 bilaterally.  Posterolateral arthrodesis with Vitoss, BMP and autograft.  SURGEON:  Hilda Lias, M.D.  ASSISTANT:  Dr. Conchita Paris.  CLINICAL HISTORY:  Ms. Steenson is a lady who underwent surgery because of right leg pain.  Nevertheless, she did well for a while she felt, she had been continued to have back pain with radiation bilaterally.  She has failed with conservative treatment.  She had been unable to work.  X- ray showed worsening of degenerative disk disease with scoliosis and spondylolisthesis at L3-4 and L4-5.  Surgery was advised, and she and her husband knew the risk and benefits.  DESCRIPTION OF PROCEDURE:  The patient was taken to the OR, and after intubation, she was positioned in a prone manner.  The back was cleaned with DuraPrep and drapes were applied.  Midline incision following the previous one with resection was made from L2-L3 down to L4-5.  What we found especially in the soft tissue on the right side she has  quite a bit of scar tissue, fibrosis.  The facet although they were quite large. It was difficult to see any anatomy and we had to take several x-rays just to help [place] to see where we are.  At the end, we were able to localize and we felt at the level of L3-4, L4-5.  We started with removing the lamina of L3 on the left side as well as L4 and we worked our way to the right side taking care of scar tissue.  The dissection on the right side took [place] probably about 20% of the procedure.  At the end, we had a good decompression.  Foraminotomy was accomplished and there was plenty of room for the L3, L4, and L5 nerve root bilaterally.  We tried to get into the right side but there was no way we can retract the thecal sac.  We tried in the left side.  We retracted this at the level L4-5.  We did a total gross diskectomy going to from the left side and we were able to introduce a cage which was spanned all the way down to 40 mm.  The cage had BMP and autograft and the rest of the space was filled up in the same procedure.  At the  level of L3-4, the procedure was done the same with cages all the way down to 17.  There was a __ duratotomy________ anteriorly which was taken care with a single stitch of Prolene and muscle.  Valsalva maneuver twice up to 40 was negative. Having good decompression, using the C-arm first in AP view and then in lateral view, we made holes in the pedicles 3, 4, 5.  Prior to insertion of the screws, we feel all 4 quadrants just to be sure that we were surrounded by bone.  We used 6 screws of 5.5 x 45 which were kept in place with rods and caps.  Cross-Link from right to left was done.  Then, we went laterally and we removed the periosteum of the lateral aspect of the facet of 3-4 and 4-5 and a mix of BMP autograft and Vitoss was used for arthrodesis.  The area was irrigated.  A drain was left.  The wound was closed with different layers of Vicryl and the skin with  staples.          ______________________________ Hilda LiasErnesto Nasirah Sachs, M.D.     EB/MEDQ  D:  09/13/2015  T:  09/13/2015  Job:  [id number]

## 2015-09-13 NOTE — Anesthesia Postprocedure Evaluation (Signed)
Anesthesia Post Note  Patient: Brenda Rice  Procedure(s) Performed: Procedure(s) (LRB): Lumbar three-four lumbar four-five Posterior lumbar interbody fusion (N/A)  Patient location during evaluation: PACU Anesthesia Type: General Level of consciousness: awake and alert Pain management: pain level controlled Vital Signs Assessment: post-procedure vital signs reviewed and stable Respiratory status: spontaneous breathing, nonlabored ventilation, respiratory function stable and patient connected to nasal cannula oxygen Cardiovascular status: blood pressure returned to baseline and stable Postop Assessment: no signs of nausea or vomiting Anesthetic complications: no    Last Vitals:  Filed Vitals:   09/13/15 1630 09/13/15 1645  BP: 100/63   Pulse: 95 102  Temp:    Resp: 13 17    Last Pain: There were no vitals filed for this visit.               Reino KentJudd, Jobina Maita J

## 2015-09-13 NOTE — Progress Notes (Addendum)
Patient arrived to unit at this time appears somnolent, able to awake with stimulation. Husband at bedside at this time.Patient speech noted incomprehensible  when answering questions. Safety precautions and orders reviewed. ICS instructed but unable to follow command d/t lethargy. Will review once patient more alert. VSS. Will continue to monitor.  Sim BoastHavy, RN

## 2015-09-13 NOTE — Progress Notes (Signed)
RT note: Pt. seen @ 2036 hrs. is alert to voice, WNL, RN @ bedside setting up PCA b/l b.s.=Clear,96% sat on 2 lpm n/c, no c/o difficulty breathing @ this time, RT to monitor.

## 2015-09-13 NOTE — Transfer of Care (Signed)
Immediate Anesthesia Transfer of Care Note  Patient: Brenda Rice  Procedure(s) Performed: Procedure(s): Lumbar three-four lumbar four-five Posterior lumbar interbody fusion (N/A)  Patient Location: PACU  Anesthesia Type:General  Level of Consciousness: awake and alert   Airway & Oxygen Therapy: Patient Spontanous Breathing and Patient connected to nasal cannula oxygen  Post-op Assessment: Report given to RN, Post -op Vital signs reviewed and stable and Patient moving all extremities X 4  Post vital signs: Reviewed and stable  Last Vitals:  Filed Vitals:   09/13/15 1620 09/13/15 1630  BP: 89/65   Pulse: 96 95  Temp:    Resp: 12 13    Complications: No apparent anesthesia complications

## 2015-09-13 NOTE — Anesthesia Preprocedure Evaluation (Signed)
Anesthesia Evaluation  Patient identified by MRN, date of birth, ID band Patient awake    Reviewed: Allergy & Precautions, H&P , NPO status , Patient's Chart, lab work & pertinent test results  Airway Mallampati: III  TM Distance: >3 FB Neck ROM: Full    Dental no notable dental hx. (+) Teeth Intact, Dental Advisory Given   Pulmonary COPD,  COPD inhaler, Current Smoker,    Pulmonary exam normal breath sounds clear to auscultation       Cardiovascular hypertension, Pt. on medications negative cardio ROS   Rhythm:Regular Rate:Normal     Neuro/Psych  Headaches, Anxiety Depression    GI/Hepatic negative GI ROS, Neg liver ROS,   Endo/Other  negative endocrine ROS  Renal/GU negative Renal ROS  negative genitourinary   Musculoskeletal  (+) Arthritis , Osteoarthritis,    Abdominal   Peds  Hematology negative hematology ROS (+)   Anesthesia Other Findings Spinal stenosis, lumbar region  Reproductive/Obstetrics negative OB ROS                             Anesthesia Physical  Anesthesia Plan  ASA: III  Anesthesia Plan: General   Post-op Pain Management:    Induction: Intravenous  Airway Management Planned: Oral ETT  Additional Equipment:   Intra-op Plan:   Post-operative Plan: Extubation in OR  Informed Consent: I have reviewed the patients History and Physical, chart, labs and discussed the procedure including the risks, benefits and alternatives for the proposed anesthesia with the patient or authorized representative who has indicated his/her understanding and acceptance.   Dental advisory given  Plan Discussed with: CRNA  Anesthesia Plan Comments:         Anesthesia Quick Evaluation

## 2015-09-13 NOTE — OR Nursing (Signed)
[facility] [phone] for primary care.  We both agreed pt is too somulent to set up PCA at this time.  Will do so as patient is more awake and interactive.

## 2015-09-13 NOTE — OR Nursing (Signed)
[name] with bp 80/59.  Order received from Dr. Gentry RochJudd for IVF bolus.

## 2015-09-13 NOTE — Anesthesia Procedure Notes (Signed)
Procedure Name: Intubation Date/Time: 09/13/2015 11:28 AM Performed by: Suzy Bouchard Pre-anesthesia Checklist: Patient identified, Emergency Drugs available, Suction available, Timeout performed and Patient being monitored Patient Re-evaluated:Patient Re-evaluated prior to inductionOxygen Delivery Method: Circle system utilized Preoxygenation: Pre-oxygenation with 100% oxygen Intubation Type: IV induction Ventilation: Mask ventilation without difficulty Laryngoscope Size: Miller and 2 Grade View: Grade I Tube type: Oral Laser Tube: Cuffed inflated with minimal occlusive pressure - saline Number of attempts: 1 Airway Equipment and Method: Stylet and LTA kit utilized Placement Confirmation: ETT inserted through vocal cords under direct vision,  positive ETCO2 and breath sounds checked- equal and bilateral Secured at: 22 cm Tube secured with: Tape Dental Injury: Teeth and Oropharynx as per pre-operative assessment

## 2015-09-14 ENCOUNTER — Inpatient Hospital Stay (HOSPITAL_COMMUNITY): Payer: Managed Care, Other (non HMO)

## 2015-09-14 LAB — BLOOD GAS, ARTERIAL
ACID-BASE DEFICIT: 2.3 mmol/L — AB (ref 0.0–2.0)
Bicarbonate: 21.7 mEq/L (ref 20.0–24.0)
DRAWN BY: 244801
O2 SAT: 93.1 %
PATIENT TEMPERATURE: 98.6
PCO2 ART: 35.7 mmHg (ref 35.0–45.0)
TCO2: 22.8 mmol/L (ref 0–100)
pH, Arterial: 7.401 (ref 7.350–7.450)
pO2, Arterial: 68.6 mmHg — ABNORMAL LOW (ref 80.0–100.0)

## 2015-09-14 LAB — CBC
HCT: 22.4 % — ABNORMAL LOW (ref 36.0–46.0)
Hemoglobin: 7.3 g/dL — ABNORMAL LOW (ref 12.0–15.0)
MCH: 33.2 pg (ref 26.0–34.0)
MCHC: 32.6 g/dL (ref 30.0–36.0)
MCV: 101.8 fL — AB (ref 78.0–100.0)
PLATELETS: 69 10*3/uL — AB (ref 150–400)
RBC: 2.2 MIL/uL — AB (ref 3.87–5.11)
RDW: 14.4 % (ref 11.5–15.5)
WBC: 8.6 10*3/uL (ref 4.0–10.5)

## 2015-09-14 NOTE — Progress Notes (Addendum)
Patient complaining of some tingling in her right hand and right upper leg. Otherwise pain is being controlled well with the PCA. Dressing was saturated with blood. It was reinforced with gauze and an ABD pad until am when MD can evaluate. Continuing to monitor. Maddox Hlavaty, [place] ScrapeSarah E, RN

## 2015-09-14 NOTE — Evaluation (Signed)
Occupational Therapy Evaluation Patient Details Name: Brenda Rice MRN: [id number] DOB: 1955-12-09 Today's Date: 09/14/2015    History of Present Illness decompression on the right side, L3-4, L4-5.   Clinical Impression   Patient is s/p back surgery resulting in the deficits listed below (see OT Problem List).  Patient will benefit from skilled OT to increase their safety and independence with ADL and functional mobility for ADL (while adhering to their precautions) to facilitate discharge to venue listed below.      Follow Up Recommendations  Home health OT;SNF;Supervision/Assistance - 24 hour;Other (comment) (depending on progress)    Equipment Recommendations  Other (comment) (TBD)       Precautions / Restrictions Precautions Precautions: Back Required Braces or Orthoses: Spinal Brace      Mobility Bed Mobility Overal bed mobility: +2 for physical assistance;+ 2 for safety/equipment;Needs Assistance Bed Mobility: Supine to Sit     Supine to sit: Total assist;+2 for physical assistance;+2 for safety/equipment        Transfers                 General transfer comment: did not perform         ADL Overall ADL's : Needs assistance/impaired                                       General ADL Comments: pt sat EOB and had trouble breathing.  Pt unable to take deep breathe.  RN called and pt returned to supine               Pertinent Vitals/Pain Pain Assessment: 0-10 Pain Score: 8  Pain Intervention(s): Repositioned;Monitored during session;Utilized relaxation techniques;PCA encouraged     Hand Dominance     Extremity/Trunk Assessment Upper Extremity Assessment Upper Extremity Assessment: Generalized weakness           Communication Communication Communication: No difficulties   Cognition Arousal/Alertness: Awake/alert Behavior During Therapy: WFL for tasks assessed/performed Overall Cognitive Status: Within Functional  Limits for tasks assessed                                Home Living Family/patient expects to be discharged to:: Private residence   Available Help at Discharge: Family;Available 24 hours/day Type of Home: House Home Access: Stairs to enter [employer] of Steps: 6 Entrance Stairs-Rails: Right Home Layout: One level     Bathroom Shower/Tub: Chief Strategy Officer: Standard     Home Equipment: Cane - single point               OT Diagnosis: Generalized weakness;Acute pain   OT Problem List: Decreased strength;Decreased activity tolerance;Impaired balance (sitting and/or standing);Decreased safety awareness;Pain;Cardiopulmonary status limiting activity;Decreased knowledge of use of DME or AE;Decreased knowledge of precautions   OT Treatment/Interventions: Self-care/ADL training;DME and/or AE instruction;Patient/family education    OT Goals(Current goals can be found in the care plan section) Acute Rehab OT Goals Patient Stated Goal: did not state OT Goal Formulation: With patient Time For Goal Achievement: 09/28/15 Potential to Achieve Goals: Good  OT Frequency: Min 3X/week              End of Session Nurse Communication: Mobility status;Other (comment) (trouble breathing )  Activity Tolerance: Treatment limited secondary to medical complications (Comment) Patient left: in bed;with call bell/phone within reach;with nursing/sitter  in room;Other (comment) (with MD in room)   Time: 4540-98111017-1042 OT Time Calculation (min): 25 min Charges:  OT General Charges $OT Visit: 1 Procedure OT Evaluation $OT Eval Low Complexity: 1 Procedure OT Treatments $Self Care/Home Management : 8-22 mins G-Codes:    Alba CoryEDDING, Teresha Hanks D 09/14/2015, 10:58 AM

## 2015-09-14 NOTE — Evaluation (Signed)
Physical Therapy Evaluation Patient Details Name: Brenda Rice MRN: [id number] DOB: 09-02-1955 Today's Date: 09/14/2015   History of Present Illness  Patient is a 60 y/o female with h/o prior lumbar laminectomy, HTN, depression, OA, COPD now s/p Lumbar three-four lumbar four-five Posterior lumbar interbody fusion.  Clinical Impression  Patient presents with decreased mobility due to deficits listed in PT problem list.  She was limited this session due to change in respiratory status upon sitting up on EOB and is not on bedrest for 24 hours.  Feel she may need CIR level rehab prior to d/c home to allow return to mod I level as spouse works.  PT to follow acutely and will update recommendations as pt able to progress.    Follow Up Recommendations CIR    Equipment Recommendations  Rolling walker with 5" wheels    Recommendations for Other Services Rehab consult     Precautions / Restrictions Precautions Precautions: Back Required Braces or Orthoses: Spinal Brace Spinal Brace: Applied in sitting position      Mobility  Bed Mobility Overal bed mobility: +2 for physical assistance;+ 2 for safety/equipment;Needs Assistance Bed Mobility: Rolling;Sidelying to Sit;Sit to Supine Rolling: Max assist;Mod assist Sidelying to sit: Max assist;+2 for physical assistance Supine to sit: Total assist;+2 for physical assistance;+2 for safety/equipment Sit to supine: +2 for physical assistance;Total assist   General bed mobility comments: rolled initially to attempt to use bedpain due to no brace available with rail and cues, then once brace arrived needed assist and max cues for rolling and side to sit with increased time and assist for each step; pt became SOB and decreased O2 sats as well as poor inspiratory efficiencly with audible wheeze so returned to supine   Transfers                 General transfer comment: did not perform  Ambulation/Gait                Stairs            Wheelchair Mobility    Modified Rankin (Stroke Patients Only)       Balance Overall balance assessment: Needs assistance   Sitting balance-Leahy Scale: Poor Sitting balance - Comments: EOB only few moments due to change in respiratory status                                     Pertinent Vitals/Pain Pain Assessment: 0-10 Pain Score: 8  Pain Descriptors / Indicators: Sore;Operative site guarding Pain Intervention(s): Monitored during session;Repositioned;PCA encouraged;Utilized relaxation techniques    Home Living Family/patient expects to be discharged to:: Private residence Living Arrangements: Spouse/significant other Available Help at Discharge: Family;Available 24 hours/day Type of Home: House Home Access: Stairs to enter Entrance Stairs-Rails: Right Entrance Stairs-Number of Steps: 6 Home Layout: One level Home Equipment: Cane - single point Additional Comments: drives; works Customer service manager        Extremity/Trunk Assessment   Upper Extremity Assessment: Defer to OT evaluation           Lower Extremity Assessment: Generalized weakness         Communication   Communication: No difficulties  Cognition Arousal/Alertness: Awake/alert Behavior During Therapy: Anxious Overall Cognitive Status: Within Functional Limits for tasks assessed  General Comments General comments (skin integrity, edema, etc.): pt with bed soiled by blood seeping through dressing onto bed pad    Exercises        Assessment/Plan    PT Assessment Patient needs continued PT services  PT Diagnosis Generalized weakness;Acute pain;Difficulty walking   PT Problem List Decreased balance;Decreased mobility;Decreased activity tolerance;Decreased strength;Decreased knowledge of use of DME;Decreased knowledge of precautions;Pain;Cardiopulmonary status limiting activity  PT  Treatment Interventions DME instruction;Balance training;Gait training;Functional mobility training;Patient/family education;Therapeutic activities;Therapeutic exercise;Stair training   PT Goals (Current goals can be found in the Care Plan section) Acute Rehab PT Goals Patient Stated Goal: did not state PT Goal Formulation: Patient unable to participate in goal setting Time For Goal Achievement:  Potential to Achieve Goals: Good    Frequency Min 5X/week   Barriers to discharge Decreased caregiver support sposue works    Co-evaluation PT/OT/SLP Co-Evaluation/Treatment: Yes Reason for Co-Treatment: For patient/therapist safety PT goals addressed during session: Mobility/safety with mobility;Strengthening/ROM         End of Session   Activity Tolerance: Treatment limited secondary to medical complications (Comment) Patient left: in bed;with call bell/phone within reach;with nursing/sitter in room;Other (comment) (MD in room) Nurse Communication: Other (comment) (difficulty breathing upon sitting up to EOB)         Time: 1000-1040 PT Time Calculation (min) (ACUTE ONLY): 40 min   Charges:   PT Evaluation $PT Eval Moderate Complexity: 1 Procedure PT Treatments $Therapeutic Activity: 23-37 mins   PT G CodesElray Mcgregor:        Cynthia Wynn 09/14/2015, 2:04 PM  Sheran Lawlessyndi Wynn, PT [phone] 09/14/2015

## 2015-09-14 NOTE — Progress Notes (Signed)
Therapy  Called stating that the patient was showing signs of distress, patient vitals obtained and Dr. Jeral FruitBotero was called to the beside. Dr. Jeral FruitBotero removed the hemovac and redressed the patients surgical incision. He then placed the bed -4 degrees and stated that he wanted the patient on flat bed rest for 24 hours.

## 2015-09-14 NOTE — Care Management Note (Signed)
Case Management Note  Patient Details  Name: Letitia CaulDelana L Slauson MRN: [id number] Date of Birth: 05/06/1956  Subjective/Objective:                    Action/Plan: Patient was admitted for a PLIF. Lives at home with spouse. Will follow for discharge needs pending PT/OT evals and physician orders.  Expected Discharge Date:                  Expected Discharge Plan:     In-House Referral:     Discharge planning Services     Post Acute Care Choice:    Choice offered to:     DME Arranged:    DME Agency:     HH Arranged:    HH Agency:     Status of Service:  In process, will continue to follow  Medicare Important Message Given:    Date Medicare IM Given:    Medicare IM give by:    Date Additional Medicare IM Given:    Additional Medicare Important Message give by:     If discussed at Long Length of Stay Meetings, dates discussed:    Additional Comments:  Anda KraftRobarge, Tate Zagal C, RN 09/14/2015, 8:34 AM [phone]

## 2015-09-14 NOTE — Progress Notes (Signed)
Orthopedic Tech Progress Note Patient Details:  Letitia CaulDelana L Swiatek 02/18/1956 [id number]  Patient ID: Letitia Caulelana L Ekstrom, female   DOB: 03/15/1956, [age] y.o.   MRN: [id number] Called in bio-tech brace order; spoke with Anderson MaltaStephanie  Thurza Kwiecinski 09/14/2015, 9:23 AM

## 2015-09-14 NOTE — Progress Notes (Signed)
Patient ID: Brenda Rice, female   DOB: 10/03/1955, 60 y.o.   MRN: [id number] C/o incisional pain ,thightness in her chest. Some headache . Drain removed. Dressing changed. To get abg, chest ray, ekg. Flat in bed

## 2015-09-14 NOTE — Progress Notes (Signed)
Foley Catheter removed. Will continue to monitor. Aleyah Balik, [place] ScrapeSarah E, RN

## 2015-09-14 NOTE — Progress Notes (Signed)
Rehab Admissions Coordinator Note:  Patient was screened by Clois DupesBoyette, Dorsey Charette Godwin for appropriateness for an Inpatient Acute Rehab Consult per PT recommendation.   At this time, we are recommending I will follow her ability to participate and consider an inpt rehab consult when more particiaptory.Clois Dupes.  Sarin Comunale Godwin 09/14/2015, 3:10 PM  I can be reached at [phone].

## 2015-09-15 MED ORDER — ENSURE ENLIVE PO LIQD
237.0000 mL | ORAL | Status: DC
  Administered 2015-09-15: 237 mL via ORAL
  Filled 2015-09-15 (×2): qty 237

## 2015-09-15 MED ORDER — ALPRAZOLAM 0.5 MG PO TABS: 1.0000 mg | ORAL_TABLET | Freq: Two times a day (BID) | ORAL | Status: DC

## 2015-09-15 MED ORDER — ALPRAZOLAM 0.5 MG PO TABS
1.0000 mg | ORAL_TABLET | Freq: Two times a day (BID) | ORAL | Status: DC
  Administered 2015-09-15 – 2015-09-17 (×5): 1 mg via ORAL
  Filled 2015-09-15 (×5): qty 2

## 2015-09-15 MED ORDER — PROPOFOL 1000 MG/100ML IV EMUL
INTRAVENOUS | Status: AC
  Filled 2015-09-15: qty 200

## 2015-09-15 MED ORDER — BOOST / RESOURCE BREEZE PO LIQD
1.0000 | ORAL | Status: DC
  Filled 2015-09-15 (×2): qty 1

## 2015-09-15 MED ORDER — DEXAMETHASONE SODIUM PHOSPHATE 10 MG/ML IJ SOLN
10.0000 mg | Freq: Once | INTRAMUSCULAR | Status: AC
  Administered 2015-09-15: 10 mg via INTRAVENOUS
  Filled 2015-09-15: qty 1

## 2015-09-15 MED ORDER — ADULT MULTIVITAMIN W/MINERALS CH
1.0000 | ORAL_TABLET | Freq: Every day | ORAL | Status: DC
Start: 2015-09-15 — End: 2015-09-17
  Administered 2015-09-16 – 2015-09-17 (×2): 1 via ORAL
  Filled 2015-09-15 (×2): qty 1

## 2015-09-15 MED ORDER — PROMETHAZINE HCL 25 MG/ML IJ SOLN
25.0000 mg | Freq: Four times a day (QID) | INTRAMUSCULAR | Status: DC | PRN
  Administered 2015-09-15: 25 mg via INTRAVENOUS
  Filled 2015-09-15: qty 1

## 2015-09-15 MED FILL — Sodium Chloride IV Soln 0.9%: INTRAVENOUS | Qty: 1000 | Status: AC

## 2015-09-15 MED FILL — Sodium Chloride Irrigation Soln 0.9%: Qty: 3000 | Status: AC

## 2015-09-15 MED FILL — Heparin Sodium (Porcine) Inj 1000 Unit/ML: INTRAMUSCULAR | Qty: 30 | Status: AC

## 2015-09-15 NOTE — Progress Notes (Signed)
Initial Nutrition Assessment   INTERVENTION:  Provide Ensure Enlive once daily, provides 350 kcal and 20 grams of protein Provide Boost Breeze po once daily, each supplement provides 250 kcal and 9 grams of protein Provide Multivitamin with minerals daily   NUTRITION DIAGNOSIS:   Inadequate oral intake related to nausea, vomiting as evidenced by per patient/family report, meal completion < 25%.   GOAL:   Patient will meet greater than or equal to 90% of their needs   MONITOR:   PO intake, Supplement acceptance, Labs, Skin, I & O's  REASON FOR ASSESSMENT:   Malnutrition Screening Tool    ASSESSMENT:   60 year old female with history of COPD and HTN who underwent right laminotomies for lumbar radicular pain. Did well but now she came back with worsening of her back pain with radiation to both legs , no better with conservative treatment.    S/P Bilateral L3 and L4 laminectomy and bilateral facetectomy, lysis of adhesion to decompress the L3, L4, L5 nerve root.  Pt states that she was eating well PTA, but she has not eaten anything since admission due to nausea and vomiting. Lunch tray at bedside with 0% consumed. Pt states that She has been able to drink and keep liquids down. RD emphasized the importance of adequate nutrient intake for healing and recovery. Pt agreeable to trying nutritional supplements until nausea resolves and she is able to eat again.   Labs: low hemoglobin  Diet Order:  Diet regular Room service appropriate?: Yes; Fluid consistency:: Thin  Skin:  Wound (see comment) (closed incision on back)  Last BM:  PTA  Height:   Ht Readings from Last 1 Encounters:  09/13/15 5\' 2"  (1.575 m)    Weight:   Wt Readings from Last 1 Encounters:  09/13/15 157 lb 3 oz (71.3 kg)    Ideal Body Weight:  50 kg  BMI:  Body mass index is 28.74 kg/(m^2).  Estimated Nutritional Needs:   Kcal:  1600-1850  Protein:  90-105 grams  Fluid:  1.8 L/day  EDUCATION  NEEDS:   No education needs identified at this time  Dorothea Ogleeanne Andalyn Heckstall RD, LDN Inpatient Clinical Dietitian Pager: [phone] After Hours Pager: [phone]

## 2015-09-15 NOTE — Progress Notes (Signed)
Patient with some nausea and vomiting this morning. PRN given. Patient very anxious throughout the night. Monitoring closely. Iyanna Drummer, [place] ScrapeSarah E, RN

## 2015-09-15 NOTE — Progress Notes (Signed)
Patient ID: Brenda Rice, female   DOB: 06/25/1955, 60 y.o.   MRN: [id number] Stable, no headache. sme csf leak upper wound. Skin glue applied. No weakness. Plan oob hold pt

## 2015-09-15 NOTE — Progress Notes (Signed)
Patient was told by Dr. Jeral FruitBotero on yesterday that she should keep her bed flat and she refused to lay flat. Dressing saturated.

## 2015-09-15 NOTE — Progress Notes (Signed)
Patient vomiting called answering services, spoke with Dr. Lovell SheehanJenkins gave new orders for phenergan 25 mg Iv prn q 6 hours and decadron 10 mg one time now

## 2015-09-15 NOTE — Clinical Social Work Placement (Signed)
   CLINICAL SOCIAL WORK PLACEMENT  NOTE  Date:  09/15/2015  Patient Details  Name: Brenda Rice MRN: [id number] Date of Birth: 08/13/1955  Clinical Social Work is seeking post-discharge placement for this patient at the Skilled  Nursing Facility level of care (*CSW will initial, date and re-position this form in  chart as items are completed):  Yes   Patient/family provided with [facility] Clinical Social Work Department's list of facilities offering this level of care within the geographic area requested by the patient (or if unable, by the patient's family).  Yes   Patient/family informed of their freedom to choose among providers that offer the needed level of care, that participate in Medicare, Medicaid or managed care program needed by the patient, have an available bed and are willing to accept the patient.  Yes   Patient/family informed of [facility]'s ownership interest in [facility] and [facility], as well as of the fact that they are under no obligation to receive care at these facilities.  PASRR submitted to EDS on       PASRR number received on       Existing PASRR number confirmed on       FL2 transmitted to all facilities in geographic area requested by pt/family on 09/15/15     FL2 transmitted to all facilities within larger geographic area on       Patient informed that his/her managed care company has contracts with or will negotiate with certain facilities, including the following:            Patient/family informed of bed offers received.  Patient chooses bed at       Physician recommends and patient chooses bed at      Patient to be transferred to   on  .  Patient to be transferred to facility by       Patient family notified on   of transfer.  Name of family member notified:        PHYSICIAN Please sign FL2     Additional Comment:    _______________________________________________ Orson Gearatiana Gurman [place], Student-SW 09/15/2015, 10:45 AM

## 2015-09-15 NOTE — [place] FL2 (Signed)
[place] MEDICAID FL2 LEVEL OF CARE SCREENING TOOL     IDENTIFICATION  Patient Name: Brenda Rice Birthdate: 1956/02/23 Sex: female Admission Date (Current Location): 09/13/2015  [place] and [place] Number:  Haynes Bast  (AETNA/AETNA MANAGED- [id number]) Facility and Address:  [facility], [address], [place], [place] [address]      Provider Number: [id number]  Attending Physician Name and Address:  Hilda Lias, MD  Relative Name and Phone Number:   Ferd Glassing- spouse ; [phone])    Current Level of Care: Hospital Recommended Level of Care: Skilled Nursing Facility Prior Approval Number:    Date Approved/Denied:   PASRR Number:    Discharge Plan: SNF    Current Diagnoses: Patient Active Problem List   Diagnosis Date Noted  . Lumbar degenerative disc disease 09/13/2015  . Migraine headache 08/03/2015  . Dehydration   . Hypokalemia 03/01/2015  . Hypomagnesemia 03/01/2015  . Nausea vomiting and diarrhea 03/01/2015  . Lumbar foraminal stenosis 02/09/2015  . MDD (major depressive disorder) (HCC) 10/06/2013  . Alcohol dependence (HCC) 05/26/2013  . GAD (generalized anxiety disorder) 05/26/2013  . Depressive disorder 05/26/2013  . Benzodiazepine dependence (HCC) 05/25/2013  . Alcohol dependence syndrome (HCC) 02/15/2013  . Suicide attempt by multiple drug overdose (HCC) 02/15/2013  . Substance induced mood disorder (HCC) 02/15/2013  . Nicotine dependence 02/15/2013  . COPD (chronic obstructive pulmonary disease) (HCC) 02/15/2013  . Hyponatremia 07/05/2012  . Nicotine addiction 09/02/2011  . Hypertension 07/12/2011  . Adjustment disorder with mixed disturbance of emotions and conduct 07/11/2011    Orientation RESPIRATION BLADDER Height & Weight     Time, Self, Situation, Place  Normal Continent Weight: 157 lb 3 oz (71.3 kg) Height:   (157.5 cm)  BEHAVIORAL SYMPTOMS/MOOD NEUROLOGICAL BOWEL NUTRITION STATUS      Continent Diet  (REGULAR)  AMBULATORY STATUS COMMUNICATION OF NEEDS Skin   Extensive Assist   Normal                       Personal Care Assistance Level of Assistance  Total care       Total Care Assistance: Maximum assistance   Functional Limitations Info             SPECIAL CARE FACTORS FREQUENCY  PT (By licensed PT), OT (By licensed OT)     PT Frequency:  (5x/week) OT Frequency:  (3x/week)            Contractures      Additional Factors Info  Code Status, Allergies Code Status Info:  (FULL) Allergies Info:  (celexa)           Current Medications (09/15/2015):  This is the current hospital active medication list Current Facility-Administered Medications  Medication Dose Route Frequency Provider Last Rate Last Dose  . 0.9 %  sodium chloride infusion  250 mL Intravenous Continuous Hilda Lias, MD   Stopped at 09/13/15 1853  . 0.9 %  sodium chloride infusion   Intravenous Continuous Hilda Lias, MD      . acetaminophen (TYLENOL) tablet 650 mg  650 mg Oral Q4H PRN Hilda Lias, MD       Or  . acetaminophen (TYLENOL) suppository 650 mg  650 mg Rectal Q4H PRN Hilda Lias, MD      . albuterol (PROVENTIL) (2.5 MG/3ML) 0.083% nebulizer solution 2.5 mg  2.5 mg Nebulization Once Hilda Lias, MD      . ALPRAZolam Prudy Feeler) tablet 1 mg  1 mg Oral  BID Loura HaltBenjamin Jared Ditty, MD   1 mg at 09/15/15 0610  . amLODipine (NORVASC) tablet 5 mg  5 mg Oral Daily Hilda LiasErnesto [name], MD      . diazepam (VALIUM) tablet 5 mg  5 mg Oral Q6H PRN Hilda LiasErnesto [name], MD      . diphenhydrAMINE (BENADRYL) injection 12.5 mg  12.5 mg Intravenous Q6H PRN Hilda LiasErnesto [name], MD       Or  . diphenhydrAMINE (BENADRYL) 12.5 MG/5ML elixir 12.5 mg  12.5 mg Oral Q6H PRN Hilda LiasErnesto [name], MD      . docusate sodium (COLACE) capsule 100 mg  100 mg Oral BID Hilda LiasErnesto [name], MD   100 mg at 09/15/15 1024  . ipratropium (ATROVENT) nebulizer solution 0.5 mg  0.5 mg Nebulization Once Hilda LiasErnesto [name], MD      . lactated  ringers infusion   Intravenous Continuous Hilda LiasErnesto [name], MD 50 mL/hr at 09/13/15 0946    . lisinopril (PRINIVIL,ZESTRIL) tablet 40 mg  40 mg Oral QAC breakfast Hilda LiasErnesto [name], MD   40 mg at 09/14/15 0859  . menthol-cetylpyridinium (CEPACOL) lozenge 3 mg  1 lozenge Oral PRN Hilda LiasErnesto [name], MD       Or  . phenol (CHLORASEPTIC) mouth spray 1 spray  1 spray Mouth/Throat PRN Hilda LiasErnesto [name], MD      . morphine (MORPHINE) 2 mg/mL PCA injection   Intravenous 6 times per day Hilda LiasErnesto [name], MD   15 mg at 09/14/15 0400  . naloxone [facility](NARCAN) injection 0.4 mg  0.4 mg Intravenous PRN Hilda LiasErnesto [name], MD       And  . sodium chloride flush (NS) 0.9 % injection 9 mL  9 mL Intravenous PRN Hilda LiasErnesto [name], MD      . ondansetron [facility](ZOFRAN) injection 4 mg  4 mg Intravenous Q4H PRN Hilda LiasErnesto [name], MD   4 mg at 09/15/15 0535  . oxyCODONE-acetaminophen (PERCOCET/ROXICET) 5-325 MG per tablet 1-2 tablet  1-2 tablet Oral Q4H PRN Hilda LiasErnesto [name], MD   2 tablet at 09/15/15 1024  . sodium chloride flush (NS) 0.9 % injection 3 mL  3 mL Intravenous Q12H Hilda LiasErnesto [name], MD   3 mL at 09/14/15 1000  . sodium chloride flush (NS) 0.9 % injection 3 mL  3 mL Intravenous PRN Hilda LiasErnesto [name], MD      . zolpidem (AMBIEN) tablet 5 mg  5 mg Oral QHS PRN Hilda LiasErnesto [name], MD   5 mg at 09/14/15 2235     Discharge Medications: Please see discharge summary for a list of discharge medications.  Relevant Imaging Results:  Relevant Lab Results:   Additional Information  (SSN: [id number])  Orson Gearatiana Karry Causer, Student-SW [phone]

## 2015-09-15 NOTE — Progress Notes (Signed)
Physical Therapy Treatment Patient Details Name: Brenda Rice MRN: [id number] DOB: [birth year] Today's Date: 09/15/2015    History of Present Illness Patient is a 60 y/o female with h/o prior lumbar laminectomy, HTN, depression, OA, COPD now s/p Lumbar three-four lumbar four-five Posterior lumbar interbody fusion.    PT Comments    Pt progressing towards physical therapy goals. Pt appeared anxious at beginning of session but was willing to attempt transition to chair. Overall session was limited by nausea and vomiting, and RN was notified. Will continue to follow and progress as able per POC.   Follow Up Recommendations  CIR     Equipment Recommendations  Rolling walker with 5" wheels    Recommendations for Other Services Rehab consult     Precautions / Restrictions Precautions Precautions: Back;Fall Precaution Comments: Reviewed back precautions verbally Required Braces or Orthoses: Spinal Brace Spinal Brace: Lumbar corset;Applied in sitting position Restrictions Weight Bearing Restrictions: No    Mobility  Bed Mobility Overal bed mobility: Needs Assistance Bed Mobility: Sidelying to Sit   Sidelying to sit: Min guard       General bed mobility comments: Pt lying on R side when PT arrived. VC's for sequencing and technique for log roll and pt was able to transition to EOB with close guard for safety. No real physical assist required.   Transfers Overall transfer level: Needs assistance Equipment used: Rolling walker (2 wheeled) Transfers: Sit to/from Stand Sit to Stand: Min assist         General transfer comment: Assist to power-up to full standing position from EOB. VC's for hand placement on seated surface for safety.   Ambulation/Gait Ambulation/Gait assistance: Min guard Ambulation Distance (Feet): 5 Feet Assistive device: Rolling walker (2 wheeled) Gait Pattern/deviations: Step-to pattern;Decreased stride length Gait velocity: Decreased Gait velocity  interpretation: Below normal speed for age/gender General Gait Details: Pt feeling very nauseated once standing and was only agreeable to ambulate to the recliner which was set about 5 feet from the bed. Close guard for safety throughout OOB mobility.    Stairs            Wheelchair Mobility    Modified Rankin (Stroke Patients Only)       Balance Overall balance assessment: Needs assistance Sitting-balance support: Feet supported;Bilateral upper extremity supported Sitting balance-Leahy Scale: Poor Sitting balance - Comments: Pt requires UE support to maintain seated balance at this time.    Standing balance support: Bilateral upper extremity supported;During functional activity Standing balance-Leahy Scale: Poor Standing balance comment: Requires UE support to maintain balance during dynamic standing activity.                     Cognition Arousal/Alertness: Awake/alert Behavior During Therapy: WFL for tasks assessed/performed (Slightly anxious at the beginning of session) Overall Cognitive Status: Within Functional Limits for tasks assessed                      Exercises      General Comments General comments (skin integrity, edema, etc.): Pt somewhat incontinent of urine once standing EOB.       Pertinent Vitals/Pain Pain Assessment: Faces Faces Pain Scale: Hurts even more Pain Location: Back Pain Descriptors / Indicators: Operative site guarding;Aching Pain Intervention(s): Limited activity within patient's tolerance;Monitored during session;Repositioned    Home Living                      Prior Function  PT Goals (current goals can now be found in the care plan section) Acute Rehab PT Goals Patient Stated Goal: did not state PT Goal Formulation: Patient unable to participate in goal setting Time For Goal Achievement:  Potential to Achieve Goals: Good Progress towards PT goals: Progressing toward goals     Frequency  Min 5X/week    PT Plan Current plan remains appropriate    Co-evaluation             End of Session Equipment Utilized During Treatment: Gait belt;Back brace Activity Tolerance: Treatment limited secondary to medical complications (Comment) (Vomiting) Patient left: in chair;with call bell/phone within reach     Time: 1020-1051 PT Time Calculation (min) (ACUTE ONLY): 31 min  Charges:  $Gait Training: 23-37 mins                    G Codes:      Conni SlipperKirkman, Lynnox Girten 09/15/2015, 1:26 PM   Conni SlipperLaura Nick Armel, PT, DPT Acute Rehabilitation Services Pager: [phone]

## 2015-09-15 NOTE — Clinical Social Work Note (Signed)
Clinical Social Work Assessment  Patient Details  Name: Brenda Rice MRN: [id number] Date of Birth: 08/06/1955  Date of referral:  09/15/15               Reason for consult:  Facility Placement, Discharge Planning                Permission sought to share information with:  Family Supports Permission granted to share information::  Yes, Verbal Permission Granted  Name::      Brenda Rice, spouse)  Agency::   (SNF)  Relationship::   (Spouse)  Contact Information:   [phone])  Housing/Transportation Living arrangements for the past 2 months:  Single Family Home Source of Information:  Patient Patient Interpreter Needed:  None Criminal Activity/Legal Involvement Pertinent to Current Situation/Hospitalization:  No - Comment as needed Significant Relationships:  Spouse Lives with:  Spouse Do you feel safe going back to the place where you live?  No Need for family participation in patient care:  Yes (Comment)  Care giving concerns:   Patient has not expressed any care giving concerns at this time.   Social Worker assessment / plan:   Patient a/o x4. BSW intern has spoken with patient at bedside in reference to discharge disposition. PT has reccommended CIR however, at this time bed availability is limited. BSW intern has presented patient with SNF list to consider for backup post-discharge plan. Patient was agreeable to this option although would rather d/c to CIR if possible. BSW intern thoroughly explained to patient that, because of insurance provider Monia Pouch(AETNA), SNF options are limited as well. Patient informed BSW intern that she works for an [employer] company so she completely understands. Patient has given BSW intern verbal permission [place] [facility] [place]. Patient's spouse, Ferd GlassingFarrell is of strong support and is willing to provide 24 hr supervision once patient d/c from SNF. BSW intern to f/u with patient regarding extended bed offers once clinical have  been reviewed by facility. Patient scheduled d/c is unknown as of right now. BSW intern remains available.   Employment status:  [employer] Insurance information:  Managed Medicare PT Recommendations:  Inpatient Rehab Consult Information / Referral to community resources:  Skilled Nursing Facility  Patient/Family's Response to care:   Patient prefers CIR however, agreeable to SNF. Patient appreciated Social Work intervention given by [employer] intern.   Patient/Family's Understanding of and Emotional Response to Diagnosis, Current Treatment, and Prognosis:   Patient understands need for further medical care and rehab at SNF.  Emotional Assessment Appearance:  Appears stated age Attitude/Demeanor/Rapport:   (pleasant) Affect (typically observed):  Accepting, Appropriate, Calm Orientation:  Oriented to Place, Oriented to Self, Oriented to  Time, Oriented to Situation Alcohol / Substance use:  Not Applicable Psych involvement (Current and /or in the community):  No (Comment)  Discharge Needs  Concerns to be addressed:  No discharge needs identified Readmission within the last 30 days:  No Current discharge risk:  None Barriers to Discharge:  No Barriers Identified   Orson Gearatiana Elfie Costanza, Student-SW 09/15/2015, 11:12 AM

## 2015-09-16 MED ORDER — ENSURE ENLIVE PO LIQD
237.0000 mL | ORAL | Status: DC
  Filled 2015-09-16 (×2): qty 237

## 2015-09-16 MED ORDER — ALUM & MAG HYDROXIDE-SIMETH 200-200-20 MG/5ML PO SUSP
30.0000 mL | Freq: Four times a day (QID) | ORAL | Status: DC | PRN
  Administered 2015-09-16: 30 mL via ORAL
  Filled 2015-09-16: qty 30

## 2015-09-16 MED ORDER — MAGNESIUM CITRATE PO SOLN
1.0000 | Freq: Once | ORAL | Status: AC
  Administered 2015-09-16: 1 via ORAL
  Filled 2015-09-16: qty 296

## 2015-09-16 MED ORDER — POLYETHYLENE GLYCOL 3350 17 G PO PACK
17.0000 g | PACK | Freq: Every day | ORAL | Status: DC
  Administered 2015-09-16: 17 g via ORAL
  Filled 2015-09-16 (×2): qty 1

## 2015-09-16 NOTE — Progress Notes (Signed)
1930 hrs Pt dressing soaked as well as bedpad, replaced dressing and advised pt that she should be on bedrest with head flat, Pt used bedpan at that time. 2130 hrs, went to room for bed alarm activation, Pt was OOB and in restroom w/o walker or brace, dressing with moderate amount of new drainage noted but not soaked, advised pt again that she should be lying in bed as ordered and to call for assistance. 2230 hrs again answered bed alarm for patient sitting on edge of bed requesting to go restroom, refusing brace but using walker. Dressing with moderate drainage similar to last assessment. 0005 hrs.patient did call for assist but refused to remain in bed, used walker to go to restroom, dressing remains with moderate drainage.

## 2015-09-16 NOTE — Progress Notes (Signed)
Patient ID: Brenda Rice, female   DOB: 08/06/1955, 60 y.o.   MRN: [id number] Ambulating with help. Still drainage from upper part of wound. Cleaned and new staples applied. No fever. i will be on call for the weekend. She will need pt/ot at home

## 2015-09-16 NOTE — Progress Notes (Signed)
Administered mag citrate. Patient had large loose bowel movement x2.

## 2015-09-16 NOTE — Clinical Documentation Improvement (Signed)
Neuro Surgery  Abnormal Lab/Test Results:  09/14/15: H/H= 7.3/22.4  Possible Clinical Conditions associated with below indicators  Acute Blood Loss Anemia  Other Condition  Cannot Clinically Determine   Supporting Information: Preop H/H= 13.5/39.8 09/13/15: EBL= 400 cc  Treatment Provided: Albumin x 2 IVF   Please exercise your independent, professional judgment when responding. A specific answer is not anticipated or expected.   Thank You,  Cherylann Ratelonna J Seona Clemenson, RN, BSN Health Information Management [place] [phone]

## 2015-09-16 NOTE — Care Management Note (Signed)
Case Management Note  Patient Details  Name: Brenda Rice MRN: [id number] Date of Birth: 08/05/1955  Subjective/Objective:                    Action/Plan: Patient with drainage from wound. CM continuing to follow for d/c needs.   Expected Discharge Date:                  Expected Discharge Plan:     In-House Referral:     Discharge planning Services     Post Acute Care Choice:    Choice offered to:     DME Arranged:    DME Agency:     HH Arranged:    HH Agency:     Status of Service:  In process, will continue to follow  Medicare Important Message Given:    Date Medicare IM Given:    Medicare IM give by:    Date Additional Medicare IM Given:    Additional Medicare Important Message give by:     If discussed at Long Length of Stay Meetings, dates discussed:    Additional Comments:  Kermit BaloKelli F Jalayna Josten, RN 09/16/2015, 11:16 AM

## 2015-09-16 NOTE — Progress Notes (Signed)
Physical Therapy Treatment Patient Details Name: Brenda Rice L Hentges MRN: [id number] DOB: 04/18/1956 Today's Date: 09/16/2015    History of Present Illness Patient is a 60 y/o female with h/o prior lumbar laminectomy, HTN, depression, OA, COPD now s/p Lumbar three-four lumbar four-five Posterior lumbar interbody fusion.    PT Comments    Pt progressing towards physical therapy goals. Was able to perform transfers and ambulation with min guard or occasional min assist for balance support. Pt was educated on brace management, back precautions, and recommended time OOB while in the hospital. Will continue to follow and progress as able per POC.   Follow Up Recommendations  CIR     Equipment Recommendations  Rolling walker with 5" wheels    Recommendations for Other Services Rehab consult     Precautions / Restrictions Precautions Precautions: Back;Fall Precaution Comments: Reviewed back precautions verbally Required Braces or Orthoses: Spinal Brace Spinal Brace: Lumbar corset;Applied in sitting position Restrictions Weight Bearing Restrictions: No    Mobility  Bed Mobility Overal bed mobility: Needs Assistance Bed Mobility: Sidelying to Sit;Rolling;Sit to Sidelying Rolling: Supervision Sidelying to sit: Min guard     Sit to sidelying: Min guard General bed mobility comments: VC's for sequencing and technique for log roll and pt was able to transition to EOB with close guard for safety. No real physical assist required.   Transfers Overall transfer level: Needs assistance Equipment used: Rolling walker (2 wheeled) Transfers: Sit to/from Stand Sit to Stand: Min assist         General transfer comment: Assist to power-up to full standing position from EOB. VC's for hand placement on seated surface for safety.   Ambulation/Gait Ambulation/Gait assistance: Min guard Ambulation Distance (Feet): 150 Feet Assistive device: Rolling walker (2 wheeled) Gait Pattern/deviations:  Step-through pattern;Decreased stride length;Trunk flexed Gait velocity: Decreased Gait velocity interpretation: Below normal speed for age/gender General Gait Details: Pt was able to ambulate an increased distance with no reports of increased pain or signficant fatigue. Hands-on guarding provided for safety but no physical assistance was required.    Stairs            Wheelchair Mobility    Modified Rankin (Stroke Patients Only)       Balance Overall balance assessment: Needs assistance Sitting-balance support: Feet supported;No upper extremity supported Sitting balance-Leahy Scale: Fair Sitting balance - Comments: Pt requires UE support to maintain seated balance at this time.    Standing balance support: No upper extremity supported;During functional activity Standing balance-Leahy Scale: Fair Standing balance comment: Standing at sink washing hands without support                    Cognition Arousal/Alertness: Awake/alert Behavior During Therapy: WFL for tasks assessed/performed Overall Cognitive Status: Within Functional Limits for tasks assessed                      Exercises      General Comments        Pertinent Vitals/Pain Pain Assessment: Faces Faces Pain Scale: Hurts little more Pain Location: back Pain Descriptors / Indicators: Operative site guarding;Aching Pain Intervention(s): Limited activity within patient's tolerance;Monitored during session;Repositioned    Home Living                      Prior Function            PT Goals (current goals can now be found in the care plan section) Acute Rehab PT  Goals Patient Stated Goal: did not state PT Goal Formulation: Patient unable to participate in goal setting Time For Goal Achievement:  Potential to Achieve Goals: Good Progress towards PT goals: Progressing toward goals    Frequency  Min 5X/week    PT Plan Current plan remains appropriate     Co-evaluation             End of Session Equipment Utilized During Treatment: Gait belt;Back brace Activity Tolerance: Patient tolerated treatment well Patient left: in chair;with call bell/phone within reach     Time: 1139-1209 PT Time Calculation (min) (ACUTE ONLY): 30 min  Charges:  $Gait Training: 23-37 mins                    G Codes:      Conni Slipper 10/09/15, 2:20 PM   Conni Slipper, PT, DPT Acute Rehabilitation Services Pager: [phone]

## 2015-09-17 NOTE — Discharge Summary (Signed)
Physician Discharge Summary  Patient ID: Brenda Rice MRN: [id number] DOB/AGE: 62/06/1955 62 y.o.  Admit date: 09/13/2015 Discharge date: 09/17/2015  Admission Diagnoses:lumbar degenerative disc disease  Discharge Diagnoses:  Active Problems:   Lumbar degenerative disc disease   Discharged Condition: ambulating  Hospital Course: surgery  Consults: none  Significant Diagnostic Studies: myelogram  Treatments: lumbar fusion  Discharge Exam: Blood pressure 131/67, pulse 86, temperature 98.2 F (36.8 C), temperature source Oral, resp. rate 16, height 5\' 2"  (1.575 m), weight 71.3 kg (157 lb 3 oz), SpO2 95 %. No weakness. Wound dry. No headache  Disposition: home     Medication List    ASK your doctor about these medications        albuterol 108 (90 Base) MCG/ACT inhaler  Commonly known as:  VENTOLIN HFA  INHALE 2 PUFFS INTO THE LUNGS EVERY 6 (SIX) HOURS AS NEEDED FOR WHEEZING.     ALPRAZolam 1 MG tablet  Commonly known as:  XANAX  Take 1 tablet (1 mg total) by mouth 2 (two) times daily. May take an additional 0.5 mg once a day if needed for anxiety     amLODipine 5 MG tablet  Commonly known as:  NORVASC  Take 1 tablet (5 mg total) by mouth daily.     HYDROcodone-acetaminophen 10-325 MG tablet  Commonly known as:  NORCO  Take 1 tablet by mouth every 6 (six) hours as needed.     lisinopril 40 MG tablet  Commonly known as:  PRINIVIL,ZESTRIL  TAKE 1 TABLET BY MOUTH EVERY DAY FOR BLOOD PRESSURE CONTROL     OVER THE COUNTER MEDICATION  OTC [place] eye drops taking prn     traMADol 50 MG tablet  Commonly known as:  ULTRAM  TAKE 1 TABLET BY MOUTH EVERY 8 TO 12 HOURS AS NEEDED FOR PAIN         Signed: Karn CassisBOTERO,[name] 09/17/2015, 3:33 PM

## 2015-09-17 NOTE — Progress Notes (Signed)
CM received call from RN requesting rolling walker.  CM called [facility] DME rep, Trey PaulaJeff to please deliver the rolling walker to room so pt can discharge.  No other CM needs were communicated.

## 2015-09-17 NOTE — Progress Notes (Signed)
Pt discharged to home via wheelchair. Pt understood discharge instructions. Husband accompanied pt to home

## 2015-09-17 NOTE — Progress Notes (Signed)
Patient ID: Brenda Rice, female   DOB: 02/28/1956, 60 y.o.   MRN: [id number] Stable. Wound dry. No headache

## 2015-09-17 NOTE — Progress Notes (Signed)
Physical Therapy Treatment Patient Details Name: Brenda Rice MRN: [id number] DOB: 05-22-1956 Today's Date: 09/17/2015    History of Present Illness Patient is a 60 y/o female with h/o prior lumbar laminectomy, HTN, depression, OA, COPD now s/p Lumbar three-four lumbar four-five Posterior lumbar interbody fusion.    PT Comments    Pt progressing well towards all goals. Discussed going home and pt able to report he plan on how shes going to make meals and serve her self and reports she will only shower when spouse is home. Pt safe for d/c home once medically stable. Recommend HHPT however pt declined. Pt demo'd good understanding of brace and back precautions.  Follow Up Recommendations  No PT follow up;Supervision - Intermittent     Equipment Recommendations  Rolling walker with 5" wheels    Recommendations for Other Services       Precautions / Restrictions Precautions Precautions: Back;Fall Precaution Booklet Issued: Yes (comment) Precaution Comments: pt able to recall precautions Required Braces or Orthoses: Spinal Brace Spinal Brace: Lumbar corset;Applied in sitting position (re-fitted pt for brace and re-educated on how to don it) Restrictions Weight Bearing Restrictions: No    Mobility  Bed Mobility               General bed mobility comments: pt sitting EOB upon PT arrival, pt able to report sidelying technique to get in/out of bed  Transfers Overall transfer level: Needs assistance Equipment used: Rolling walker (2 wheeled) Transfers: Sit to/from Stand Sit to Stand: Supervision         General transfer comment: v/c's for safe hand placement  Ambulation/Gait Ambulation/Gait assistance: Supervision Ambulation Distance (Feet): 20 Feet   Gait Pattern/deviations: Step-through pattern Gait velocity: dec Gait velocity interpretation: Below normal speed for age/gender General Gait Details: v/c's for walker safety when going in/out of bathroom and up to  sink   Stairs Stairs: Yes Stairs assistance: Min assist Stair Management: One rail Left;Step to pattern Number of Stairs: 3 General stair comments: minA to act as R hand rail due to LE weakness and back pain. discussed how to have husband help  Wheelchair Mobility    Modified Rankin (Stroke Patients Only)       Balance Overall balance assessment: Needs assistance Sitting-balance support: Feet supported Sitting balance-Leahy Scale: Good     Standing balance support: No upper extremity supported;During functional activity (washing hands) Standing balance-Leahy Scale: Good Standing balance comment: stood at sink to wash hands                    Cognition Arousal/Alertness: Awake/alert Behavior During Therapy: WFL for tasks assessed/performed Overall Cognitive Status: Within Functional Limits for tasks assessed                      Exercises      General Comments General comments (skin integrity, edema, etc.): pt indepent with transfering to toliet and performing hygiene      Pertinent Vitals/Pain Pain Assessment: 0-10 Pain Score: 5  Pain Location: back Pain Descriptors / Indicators: Operative site guarding Pain Intervention(s): Limited activity within patient's tolerance    Home Living                      Prior Function            PT Goals (current goals can now be found in the care plan section) Acute Rehab PT Goals Patient Stated Goal: home today Progress towards PT  goals: Progressing toward goals    Frequency  Min 5X/week    PT Plan Discharge plan needs to be updated    Co-evaluation             End of Session Equipment Utilized During Treatment: Gait belt;Back brace Activity Tolerance: Patient tolerated treatment well Patient left: in chair;with call bell/phone within reach     Time: 1020-1052 PT Time Calculation (min) (ACUTE ONLY): 32 min  Charges:  $Gait Training: 8-22 mins $Therapeutic Activity: 8-22  mins                    G Codes:      Marcene BrawnChadwell, Shatera Rennert Marie 09/17/2015, 11:27 AM   Lewis ShockAshly Samule Life, PT, DPT Pager #: [phone] Office #: [phone]

## 2015-09-21 ENCOUNTER — Telehealth: Payer: Self-pay | Admitting: Family Medicine

## 2015-09-21 NOTE — Telephone Encounter (Signed)
EMS called to alert me that Brenda Rice has passed away at home. She had surgery on her neck last week.  They do not plan to refer the case to the ME.   I am certainly willing to sign the death certificate and am sad to hear of her passing.  Will call her husband and speak to him later

## 2015-09-21 NOTE — Telephone Encounter (Signed)
Called for Ferd GlassingFarrell- left message on machine with my current office number.  Please call if I can help

## 2015-09-24 DEATH — deceased

## 2015-09-26 ENCOUNTER — Telehealth: Payer: Self-pay | Admitting: Family Medicine

## 2015-09-26 NOTE — Telephone Encounter (Signed)
Taken care of

## 2015-09-26 NOTE — Telephone Encounter (Signed)
Pt's sister Ferd GlassingFarrell called in to request a call back from provider. She says that the pt has passed away.    CB: [phone]

## 2015-09-26 NOTE — Telephone Encounter (Addendum)
He would like me to complete death cert which I am glad to do. He wonders if the death will be deemed accidental or natural.   I called risk management for [facility] and discussed-this would be a natural death. Let Jacqulyn DuckingFerrell know and completed certificate, will fax to funeral home now and placed original up front for pick up. Alerted Marine scientistfuneral director

## 2015-09-26 NOTE — Telephone Encounter (Signed)
Dr. Patsy Lageropland, pt's husband, Ferd GlassingFarrell, called and spoke w/ [employer] over the weekend and requested call back regarding pt's death certificate and "several other questions regarding the issue." CB #: [phone]

## 2015-09-26 NOTE — Telephone Encounter (Signed)
Called and discussed with Ferd GlassingFarrell-

## 2016-12-22 IMAGING — CT CT L SPINE W/ CM
3 of 8 series · 10 of 33 positions shown, 11 images · non-contrast
Comparison: Lumbar spine MRI 09/10/2014

CLINICAL DATA: Chronic low back pain with more recent onset of
right hip and thigh pain.
TECHNIQUE: Contiguous axial images were obtained through the Lumbar spine after
the intrathecal infusion of contrast. Coronal and sagittal
reconstructions were obtained of the axial image sets.

[Series 3: l spine 3.0 b41s · axial · 0.22mm/px · z∈[-200,-116]mm · 2 of 70 slices shown, 3 images]
[im 28/70  soft-tissue]
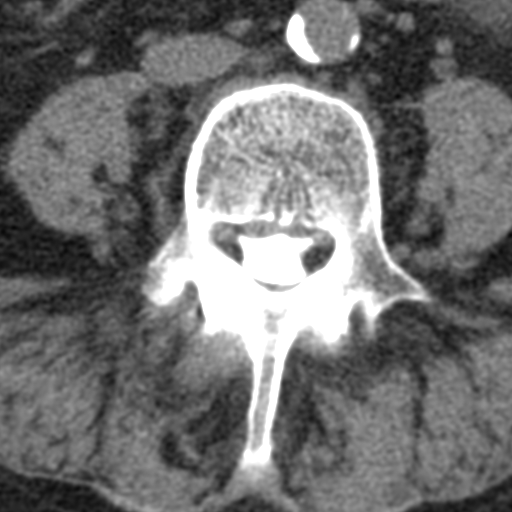
[im 28/70  bone]
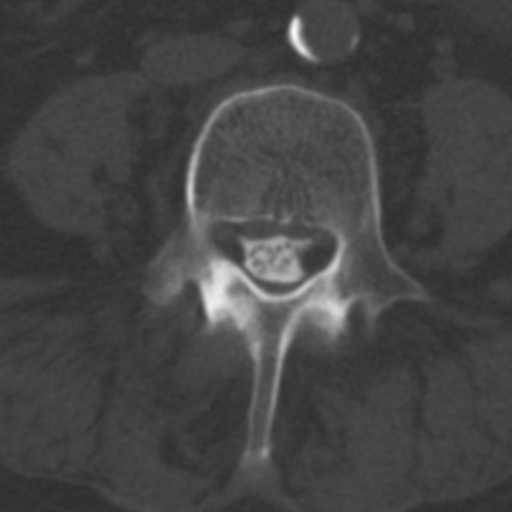
[im 56/70  bone]
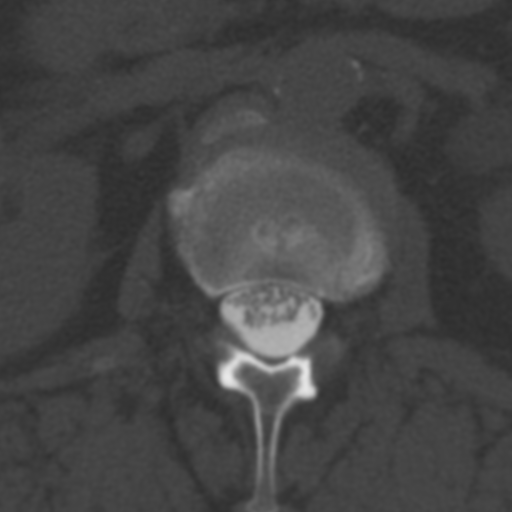

[Series 7: l spine bone cor · coronal · 0.23mm/px · 3 of 33 slices shown]
[im 7/33  bone]
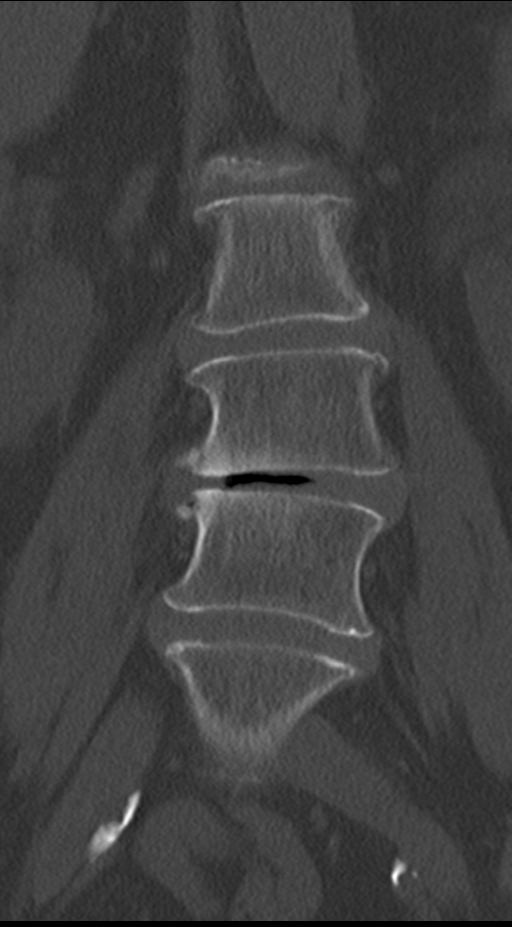
[im 13/33  bone]
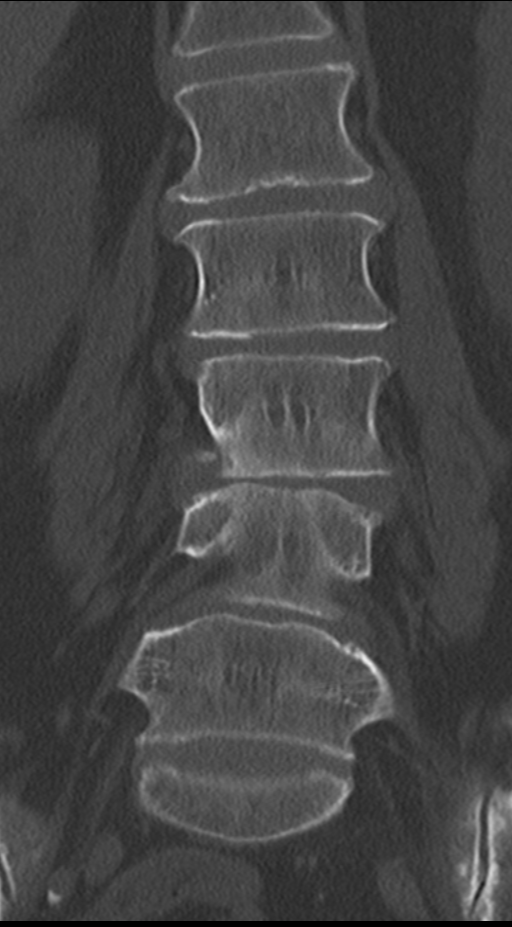
[im 20/33  bone]
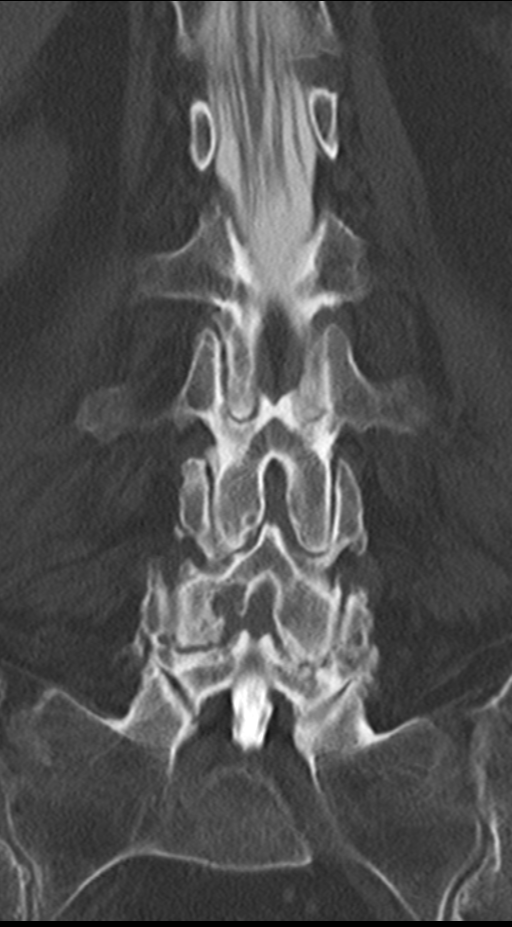

[Series 8: l spine sag bone sag · sagittal · 0.22mm/px · 5 of 29 slices shown]
[im 10/29  bone]
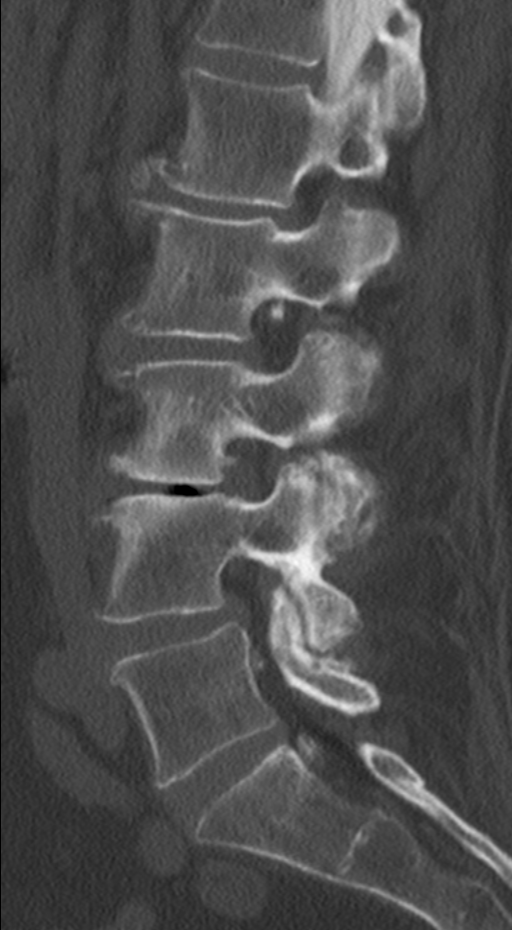
[im 12/29  bone]
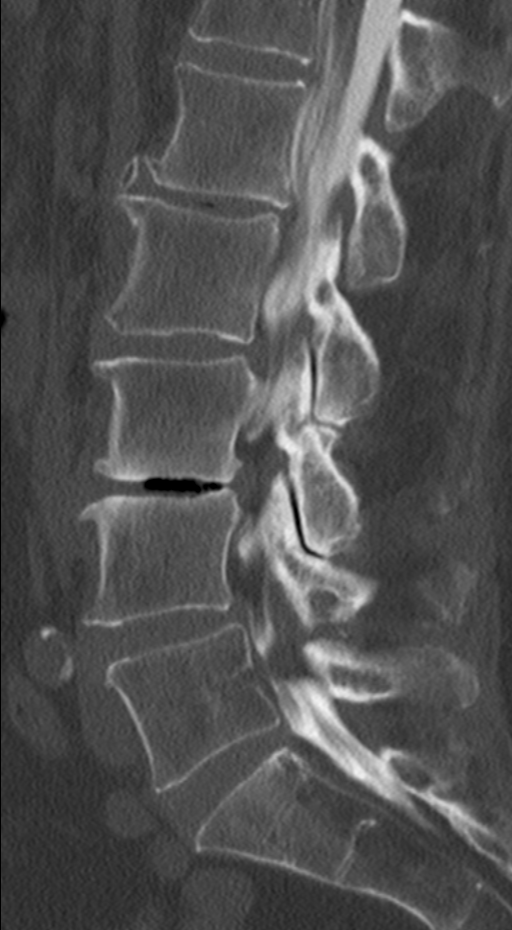
[im 15/29  bone]
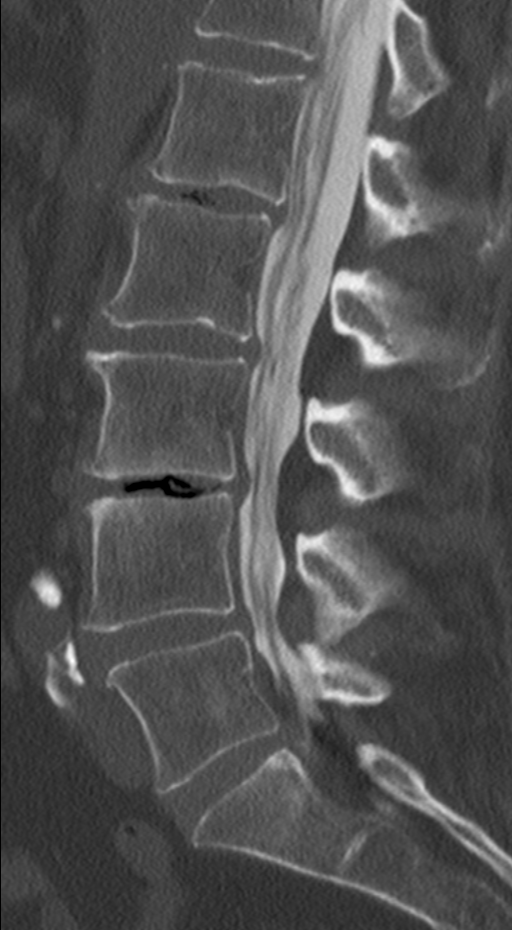
[im 17/29  bone]
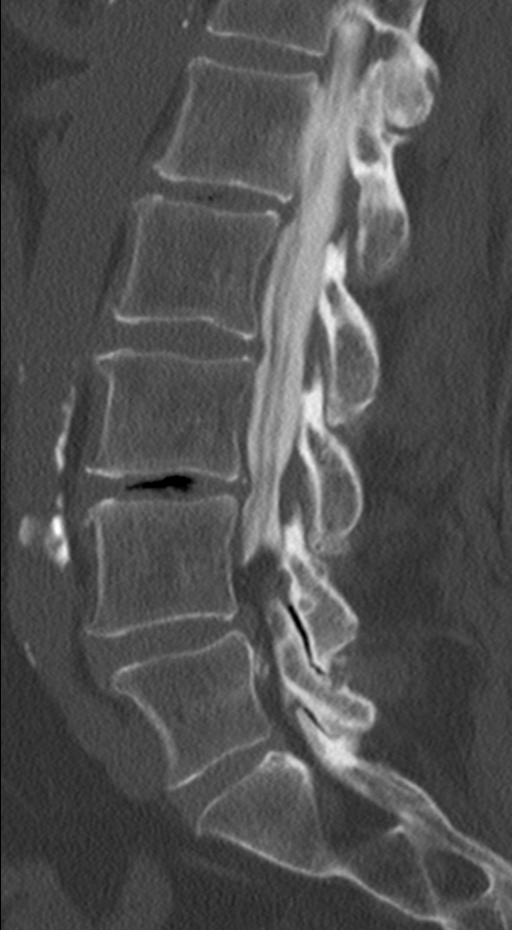
[im 19/29  bone]
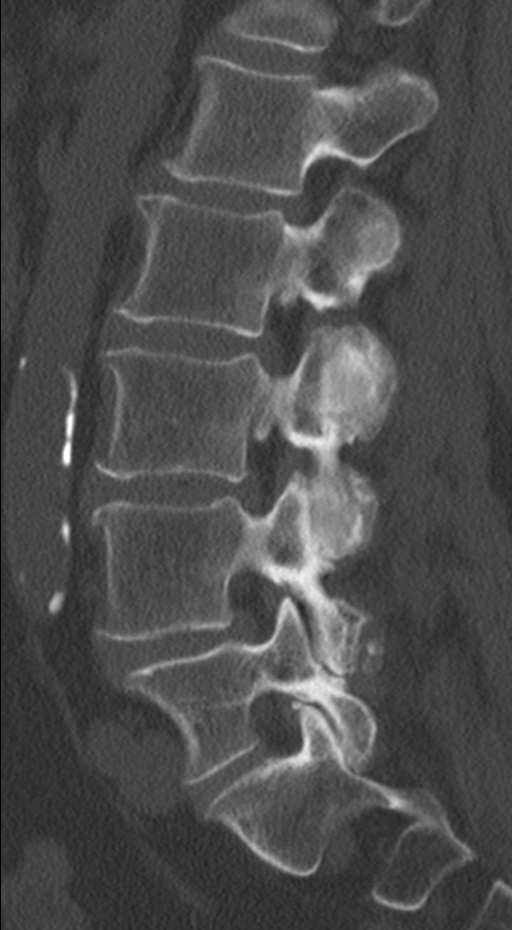

[10 of 33 positions shown; findings below may reference images not displayed]

EXAM:
LUMBAR MYELOGRAM

FLUOROSCOPY TIME:  Fluoroscopy Time (in minutes and seconds): 1
minutes 52 seconds

Number of Acquired Images:  13

PROCEDURE:
After thorough discussion of risks and benefits of the procedure
including bleeding, infection, injury to nerves, blood vessels,
adjacent structures as well as headache and CSF leak, written and
oral informed consent was obtained. Consent was obtained by Dr.
Arik Valentin. Time out form was completed.

Patient was positioned prone on the fluoroscopy table. Local
anesthesia was provided with 1% lidocaine without epinephrine after
prepped and draped in the usual sterile fashion. Puncture was
performed at L2-3 using a 3 1/2 inch 22-gauge spinal needle via a
left paramedian approach. Using a single pass through the dura, the
needle was placed within the thecal sac, with return of clear CSF.
15 mL of Ymnipaque-07P was injected into the thecal sac, with normal
opacification of the nerve roots and cauda equina consistent with
free flow within the subarachnoid space.

I personally performed the lumbar puncture and administered the
intrathecal contrast. I also personally supervised acquisition of
the myelogram images.
FINDINGS: LUMBAR MYELOGRAM FINDINGS:

Grade 1 anterolisthesis of L4 on L5 measures 3 mm in upright neutral
positioning without significant change during flexion or extension.
Vertebral body heights are preserved. There are moderate-sized
ventral extradural defects at L3-4 and L4-5 resulting in spinal
canal narrowing, greater at L4-5. Small ventral extradural defects
at L1-2 and L2-3 do not appear to result in significant stenosis.

CT LUMBAR MYELOGRAM FINDINGS:

Trace anterolisthesis of L4 on L5 is unchanged from the prior MRI.
There is mild lumbar levoscoliosis. Vertebral body heights are
preserved. There is moderate disc space narrowing at L3-4 with
endplate sclerosis, osteophytosis, and prominent vacuum disc. Mild
vacuum disc and disc space narrowing are noted at L1-2. There is
also mild disc space narrowing at L2-3 and L4-5. Conus medullaris
terminates at the superior aspect of L1. Moderate aortoiliac
atherosclerotic calcification is noted.

L1-2:  Mild disc bulging without stenosis, unchanged.

L2-3: Mild disc bulging and mild facet hypertrophy without stenosis,
unchanged.

L3-4: Moderate circumferential disc bulging, small superimposed
right foraminal disc extrusion, and moderate facet and ligamentum
flavum hypertrophy result in moderate spinal stenosis and moderate
right neural foraminal stenosis, unchanged. Disc may affect the
right L3 nerve in or lateral to the foramen. Mildly prominent dorsal
epidural fat contributes to the spinal stenosis.

L4-5: Moderate circumferential disc bulging, ligamentum flavum
hypertrophy, and severe facet arthrosis result in moderate spinal
stenosis and no more than mild left greater than right neural
foraminal narrowing, unchanged.

L5-S1: Mild-to-moderate bilateral facet arthrosis without stenosis,
unchanged.
IMPRESSION: 1. Moderate spinal stenosis and right neural foraminal stenosis at
L3-4, potentially affecting the right L3 nerve root.
2. Moderate spinal stenosis at L4-5.

## 2016-12-22 IMAGING — RF DG MYELOGRAPHY LUMBAR INJ LUMBOSACRAL
13 of 14 series · 13 of 14 positions shown · non-contrast
Comparison: Lumbar spine MRI 09/10/2014

CLINICAL DATA: Chronic low back pain with more recent onset of
right hip and thigh pain.
TECHNIQUE: Contiguous axial images were obtained through the Lumbar spine after
the intrathecal infusion of contrast. Coronal and sagittal
reconstructions were obtained of the axial image sets.

[Series 1: (hospital) · 1 of 1 slices shown]
[im 1/1]
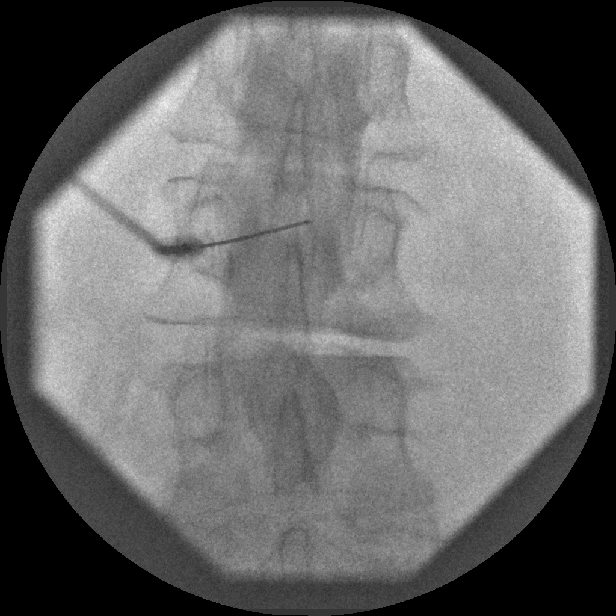

[Series 2: myelogram  white · 1 of 1 slices shown (1 of 12)]
[im 1/1]
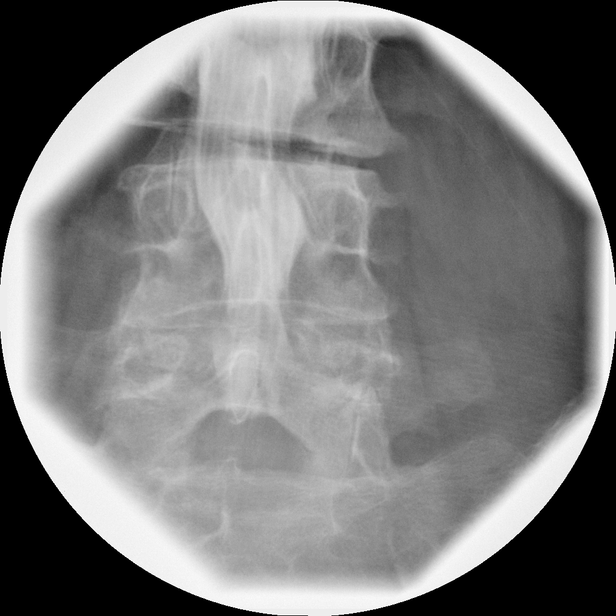

[Series 3: myelogram  white · 1 of 1 slices shown (2 of 12)]
[im 1/1]
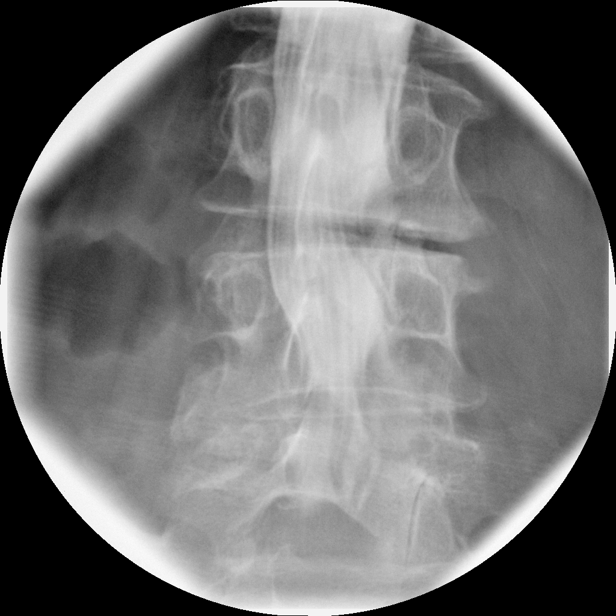

[Series 4: myelogram  white · 1 of 1 slices shown (3 of 12)]
[im 1/1]
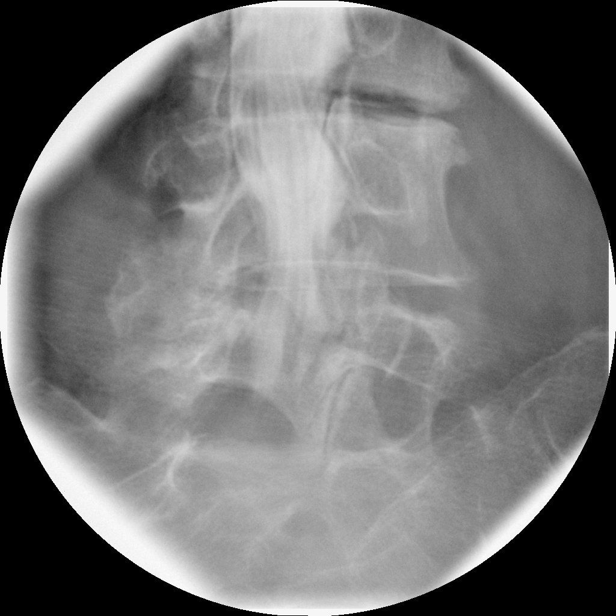

[Series 5: myelogram  white · 1 of 1 slices shown (4 of 12)]
[im 1/1]
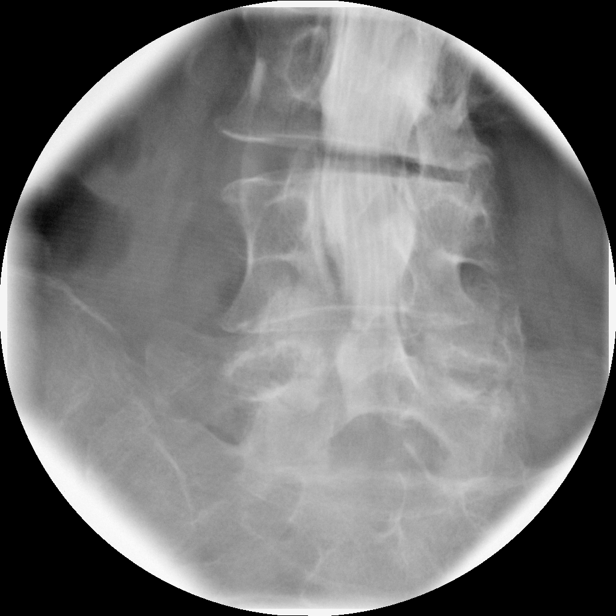

[Series 6: myelogram  white · 1 of 1 slices shown (5 of 12)]
[im 1/1]
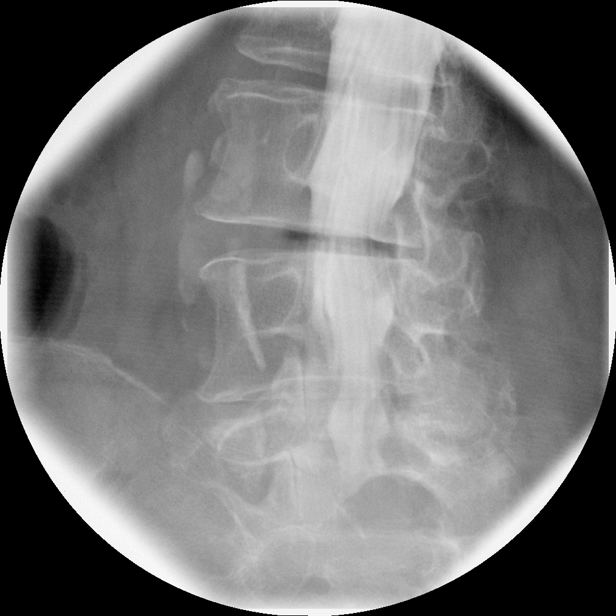

[Series 8: myelogram  white · 1 of 1 slices shown (6 of 12)]
[im 1/1]
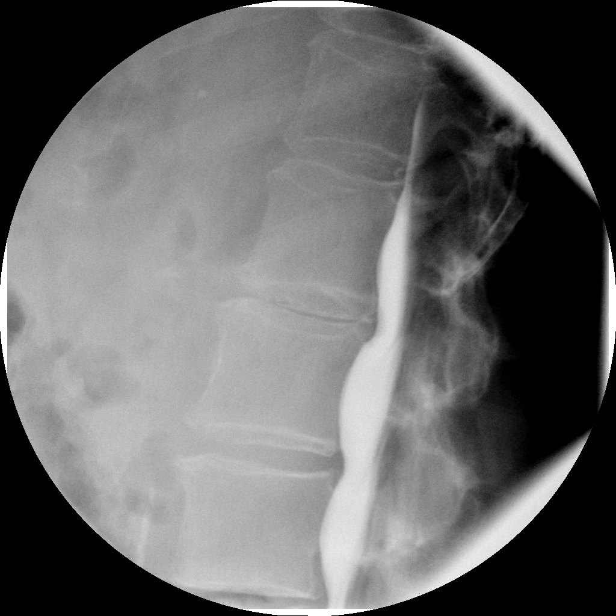

[Series 9: myelogram  white · 1 of 1 slices shown (7 of 12)]
[im 1/1]
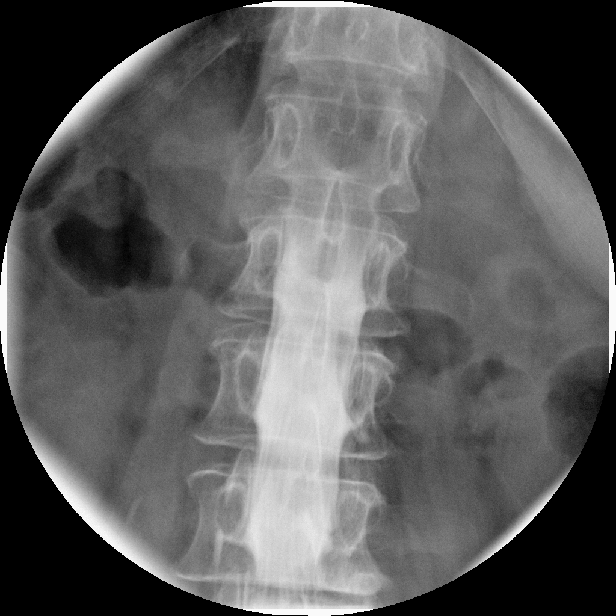

[Series 10: myelogram  white · 1 of 1 slices shown (8 of 12)]
[im 1/1]
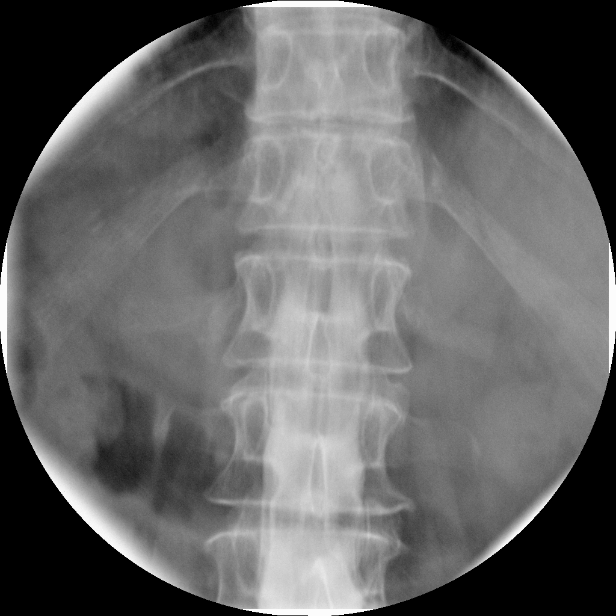

[Series 11: myelogram  white · 1 of 1 slices shown (9 of 12)]
[im 1/1]
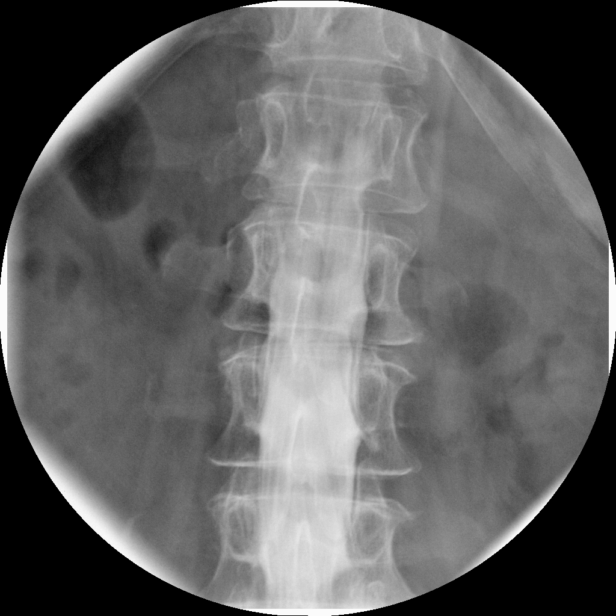

[Series 12: myelogram  white · 1 of 1 slices shown (10 of 12)]
[im 1/1]
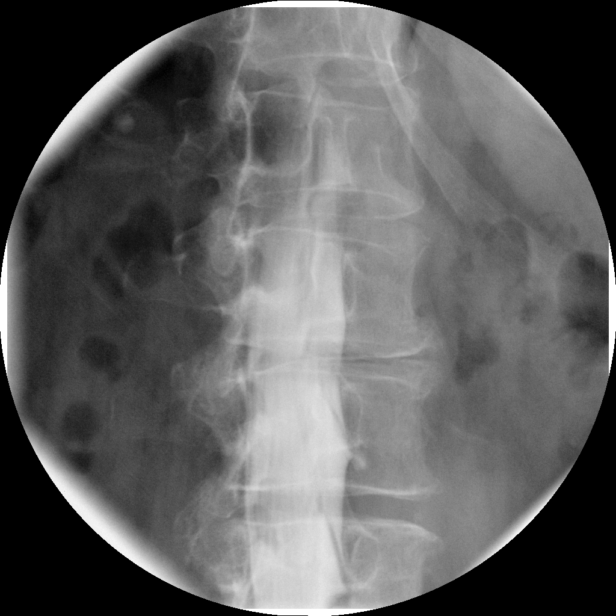

[Series 13: myelogram  white · 1 of 1 slices shown (11 of 12)]
[im 1/1]
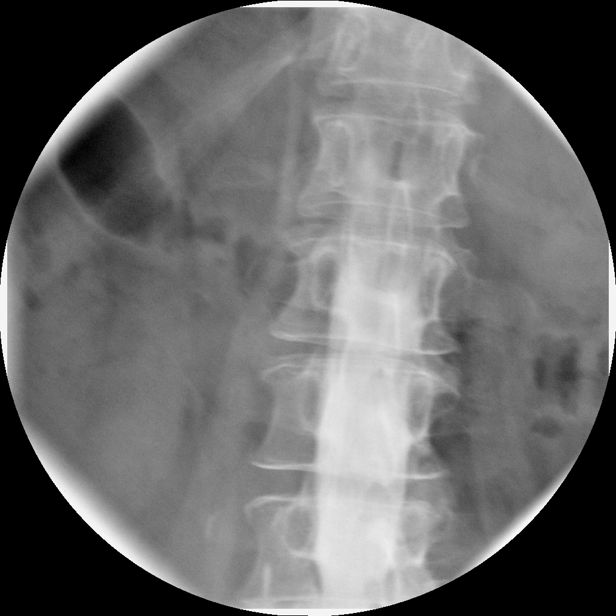

[Series 14: myelogram  white · 1 of 1 slices shown (12 of 12)]
[im 1/1]
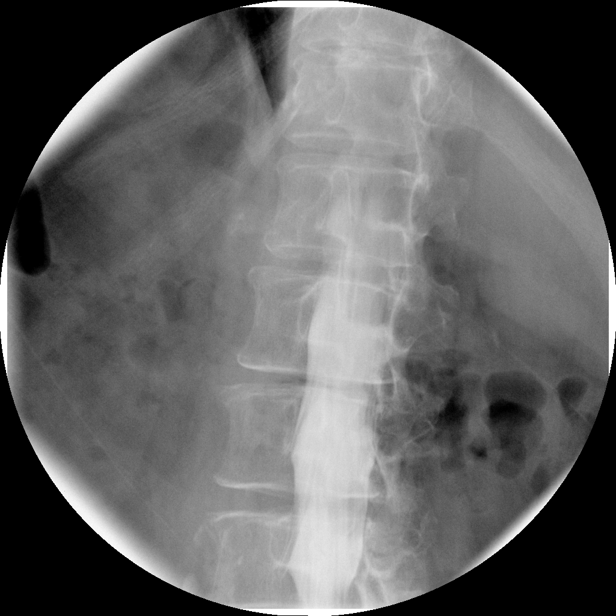

[13 of 14 positions shown; findings below may reference images not displayed]

EXAM:
LUMBAR MYELOGRAM

FLUOROSCOPY TIME:  Fluoroscopy Time (in minutes and seconds): 1
minutes 52 seconds

Number of Acquired Images:  13

PROCEDURE:
After thorough discussion of risks and benefits of the procedure
including bleeding, infection, injury to nerves, blood vessels,
adjacent structures as well as headache and CSF leak, written and
oral informed consent was obtained. Consent was obtained by Dr.
Arik Valentin. Time out form was completed.

Patient was positioned prone on the fluoroscopy table. Local
anesthesia was provided with 1% lidocaine without epinephrine after
prepped and draped in the usual sterile fashion. Puncture was
performed at L2-3 using a 3 1/2 inch 22-gauge spinal needle via a
left paramedian approach. Using a single pass through the dura, the
needle was placed within the thecal sac, with return of clear CSF.
15 mL of Ymnipaque-07P was injected into the thecal sac, with normal
opacification of the nerve roots and cauda equina consistent with
free flow within the subarachnoid space.

I personally performed the lumbar puncture and administered the
intrathecal contrast. I also personally supervised acquisition of
the myelogram images.
FINDINGS: LUMBAR MYELOGRAM FINDINGS:

Grade 1 anterolisthesis of L4 on L5 measures 3 mm in upright neutral
positioning without significant change during flexion or extension.
Vertebral body heights are preserved. There are moderate-sized
ventral extradural defects at L3-4 and L4-5 resulting in spinal
canal narrowing, greater at L4-5. Small ventral extradural defects
at L1-2 and L2-3 do not appear to result in significant stenosis.

CT LUMBAR MYELOGRAM FINDINGS:

Trace anterolisthesis of L4 on L5 is unchanged from the prior MRI.
There is mild lumbar levoscoliosis. Vertebral body heights are
preserved. There is moderate disc space narrowing at L3-4 with
endplate sclerosis, osteophytosis, and prominent vacuum disc. Mild
vacuum disc and disc space narrowing are noted at L1-2. There is
also mild disc space narrowing at L2-3 and L4-5. Conus medullaris
terminates at the superior aspect of L1. Moderate aortoiliac
atherosclerotic calcification is noted.

L1-2:  Mild disc bulging without stenosis, unchanged.

L2-3: Mild disc bulging and mild facet hypertrophy without stenosis,
unchanged.

L3-4: Moderate circumferential disc bulging, small superimposed
right foraminal disc extrusion, and moderate facet and ligamentum
flavum hypertrophy result in moderate spinal stenosis and moderate
right neural foraminal stenosis, unchanged. Disc may affect the
right L3 nerve in or lateral to the foramen. Mildly prominent dorsal
epidural fat contributes to the spinal stenosis.

L4-5: Moderate circumferential disc bulging, ligamentum flavum
hypertrophy, and severe facet arthrosis result in moderate spinal
stenosis and no more than mild left greater than right neural
foraminal narrowing, unchanged.

L5-S1: Mild-to-moderate bilateral facet arthrosis without stenosis,
unchanged.
IMPRESSION: 1. Moderate spinal stenosis and right neural foraminal stenosis at
L3-4, potentially affecting the right L3 nerve root.
2. Moderate spinal stenosis at L4-5.

## 2017-02-21 IMAGING — CR DG LUMBAR SPINE 2-3V
2 series · 2 of 2 positions shown · non-contrast
Comparison: Lumbar spine films of 11/04/2014 and 12/10/2014, as
well as MRI of the lumbar spine of 09/10/2014.

CLINICAL DATA: For L3-4 L4-5 laminectomy

EXAM:
LUMBAR SPINE - 2-3 VIEW

[lat (1 of 2)]
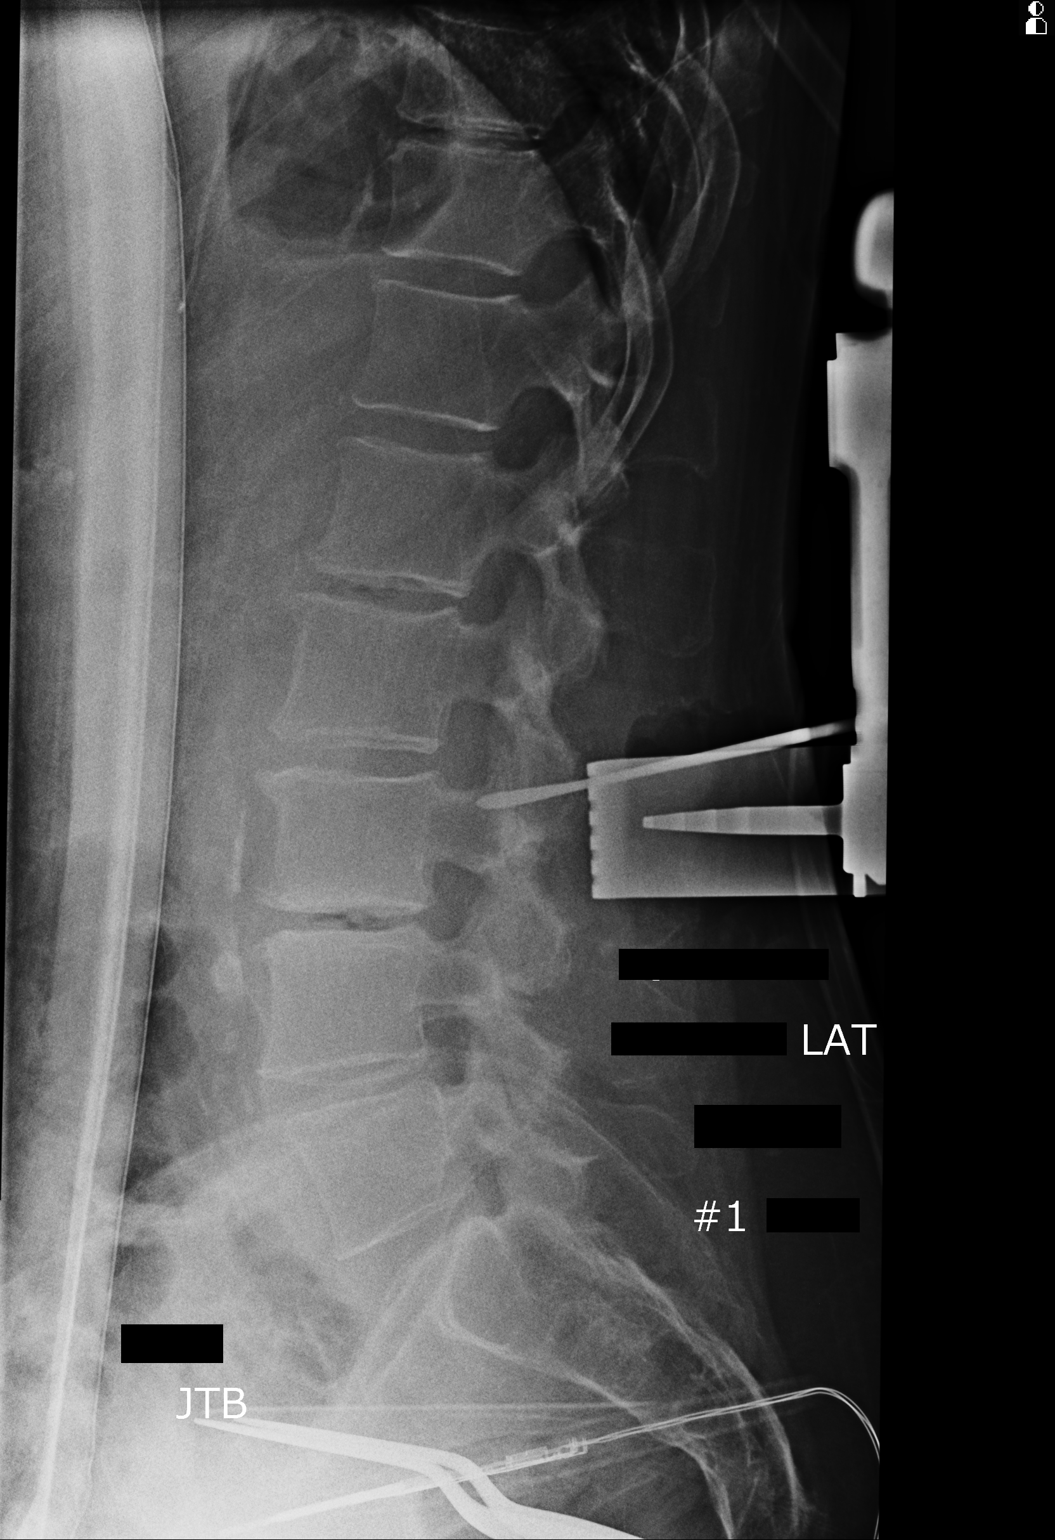

[lat (2 of 2)]
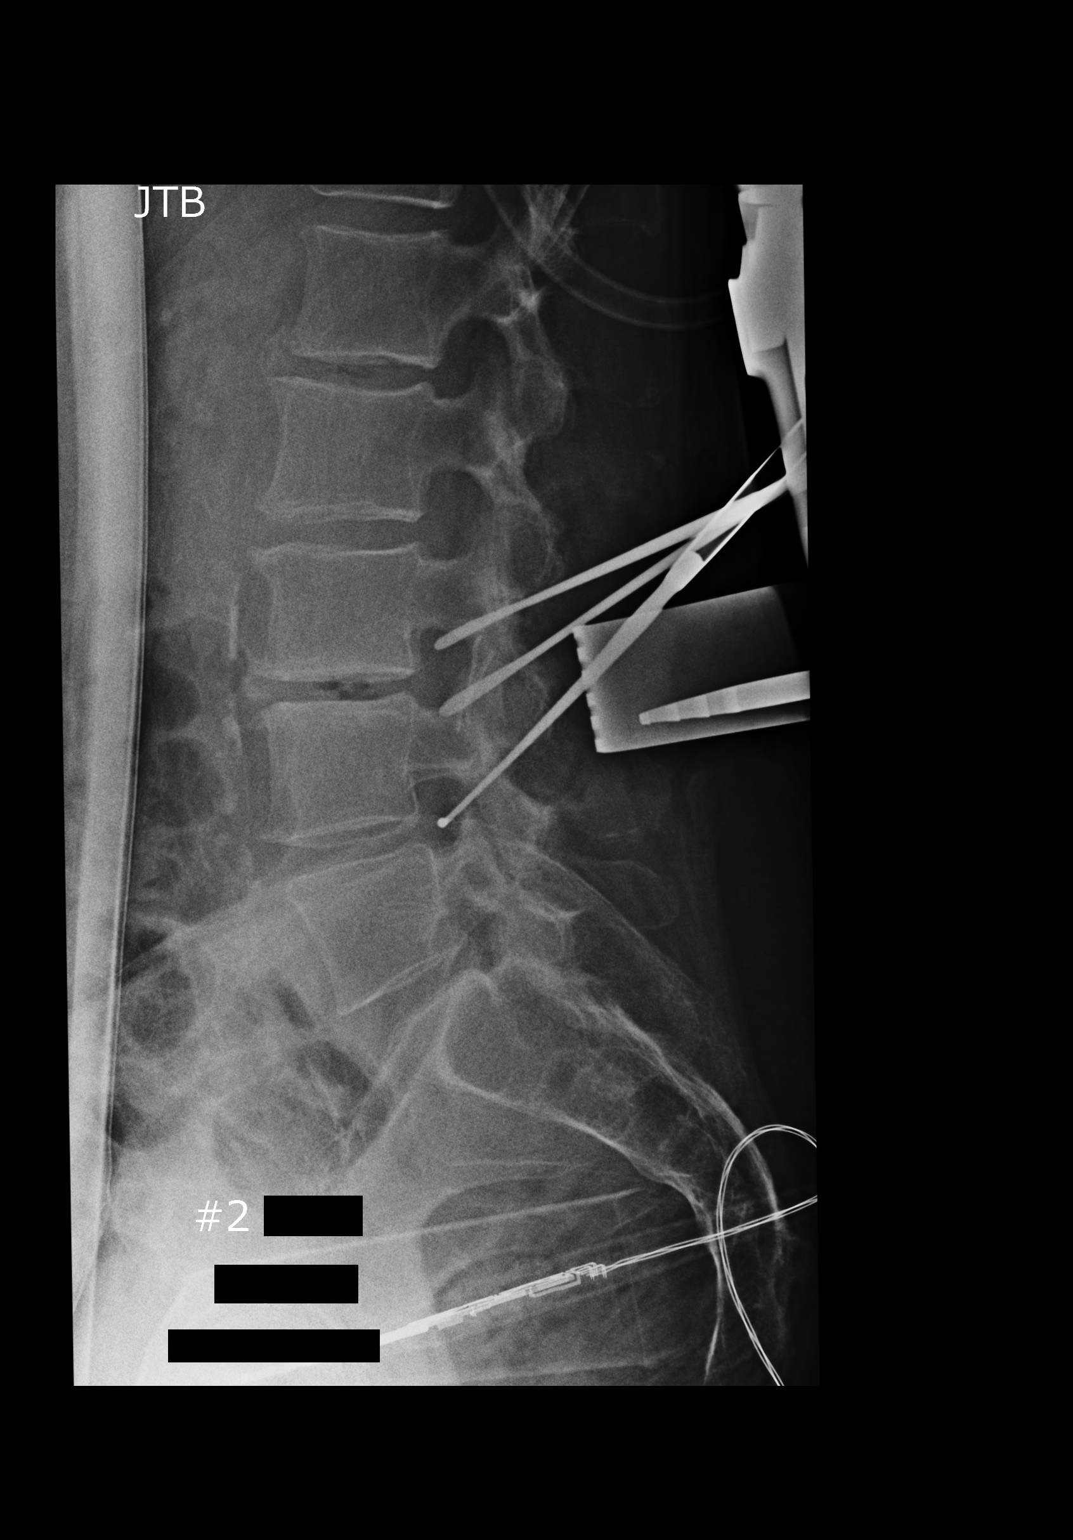

[2 of 2 positions shown; findings below may reference images not displayed]

FINDINGS: A portable lateral cross-table view from the operating room labeled
1 shows an instrument beneath the spinous process of L2 directed
toward the superior body of L3 posteriorly. CA cross-table lateral
portable view labeled 2 shows 3 instruments posteriorly. The more
cephalad is directed toward the posterior inferior body of L3, the
middle instrument is directed toward the posterior superior body of
L4, with the more caudal instrument directed toward the L4-5
interspace.
IMPRESSION: Instruments positioned for localization as described above.
